# Patient Record
Sex: Female | Born: 1965 | Race: Black or African American | Hispanic: No | Marital: Married | Smoking: Never smoker
Health system: Southern US, Community
[De-identification: ages and names within clinical notes are randomized; demographics above are authoritative.]

## PROBLEM LIST (undated history)

## (undated) DIAGNOSIS — R42 Dizziness and giddiness: Secondary | ICD-10-CM

## (undated) DIAGNOSIS — E039 Hypothyroidism, unspecified: Secondary | ICD-10-CM

## (undated) DIAGNOSIS — H539 Unspecified visual disturbance: Secondary | ICD-10-CM

## (undated) DIAGNOSIS — I10 Essential (primary) hypertension: Secondary | ICD-10-CM

## (undated) DIAGNOSIS — D649 Anemia, unspecified: Secondary | ICD-10-CM

## (undated) DIAGNOSIS — G35 Multiple sclerosis: Secondary | ICD-10-CM

## (undated) DIAGNOSIS — G709 Myoneural disorder, unspecified: Secondary | ICD-10-CM

## (undated) DIAGNOSIS — R7303 Prediabetes: Secondary | ICD-10-CM

## (undated) DIAGNOSIS — J45909 Unspecified asthma, uncomplicated: Secondary | ICD-10-CM

## (undated) DIAGNOSIS — I2699 Other pulmonary embolism without acute cor pulmonale: Secondary | ICD-10-CM

## (undated) HISTORY — DX: Dizziness and giddiness: R42

## (undated) HISTORY — DX: Myoneural disorder, unspecified: G70.9

## (undated) HISTORY — PX: EYE SURGERY: SHX253

## (undated) HISTORY — DX: Multiple sclerosis: G35

## (undated) HISTORY — DX: Unspecified visual disturbance: H53.9

## (undated) HISTORY — DX: Anemia, unspecified: D64.9

## (undated) HISTORY — DX: Essential (primary) hypertension: I10

---

## 1998-08-17 ENCOUNTER — Other Ambulatory Visit: Admission: RE | Admit: 1998-08-17 | Discharge: 1998-08-17 | Payer: Self-pay | Admitting: Gynecology

## 1999-06-13 ENCOUNTER — Ambulatory Visit (HOSPITAL_COMMUNITY): Admission: RE | Admit: 1999-06-13 | Discharge: 1999-06-13 | Payer: Self-pay | Admitting: Family Medicine

## 1999-06-13 ENCOUNTER — Encounter: Payer: Self-pay | Admitting: Family Medicine

## 1999-10-12 ENCOUNTER — Other Ambulatory Visit: Admission: RE | Admit: 1999-10-12 | Discharge: 1999-10-12 | Payer: Self-pay | Admitting: *Deleted

## 2000-11-17 ENCOUNTER — Other Ambulatory Visit: Admission: RE | Admit: 2000-11-17 | Discharge: 2000-11-17 | Payer: Self-pay | Admitting: Obstetrics and Gynecology

## 2000-11-26 ENCOUNTER — Encounter: Payer: Self-pay | Admitting: Obstetrics and Gynecology

## 2000-11-26 ENCOUNTER — Ambulatory Visit (HOSPITAL_COMMUNITY): Admission: RE | Admit: 2000-11-26 | Discharge: 2000-11-26 | Payer: Self-pay | Admitting: Obstetrics and Gynecology

## 2001-02-06 ENCOUNTER — Other Ambulatory Visit: Admission: RE | Admit: 2001-02-06 | Discharge: 2001-02-06 | Payer: Self-pay | Admitting: Obstetrics and Gynecology

## 2001-05-06 ENCOUNTER — Other Ambulatory Visit: Admission: RE | Admit: 2001-05-06 | Discharge: 2001-05-06 | Payer: Self-pay | Admitting: Obstetrics and Gynecology

## 2001-07-28 ENCOUNTER — Other Ambulatory Visit: Admission: RE | Admit: 2001-07-28 | Discharge: 2001-07-28 | Payer: Self-pay | Admitting: Obstetrics and Gynecology

## 2001-10-13 ENCOUNTER — Other Ambulatory Visit: Admission: RE | Admit: 2001-10-13 | Discharge: 2001-10-13 | Payer: Self-pay | Admitting: Obstetrics and Gynecology

## 2002-01-03 ENCOUNTER — Emergency Department (HOSPITAL_COMMUNITY): Admission: EM | Admit: 2002-01-03 | Discharge: 2002-01-03 | Payer: Self-pay

## 2002-01-08 ENCOUNTER — Other Ambulatory Visit: Admission: RE | Admit: 2002-01-08 | Discharge: 2002-01-08 | Payer: Self-pay | Admitting: Obstetrics and Gynecology

## 2002-04-03 ENCOUNTER — Other Ambulatory Visit: Admission: RE | Admit: 2002-04-03 | Discharge: 2002-04-03 | Payer: Self-pay | Admitting: Obstetrics and Gynecology

## 2002-09-11 ENCOUNTER — Other Ambulatory Visit: Admission: RE | Admit: 2002-09-11 | Discharge: 2002-09-11 | Payer: Self-pay | Admitting: Obstetrics and Gynecology

## 2003-02-05 ENCOUNTER — Other Ambulatory Visit: Admission: RE | Admit: 2003-02-05 | Discharge: 2003-02-05 | Payer: Self-pay | Admitting: Obstetrics and Gynecology

## 2004-01-28 ENCOUNTER — Other Ambulatory Visit: Admission: RE | Admit: 2004-01-28 | Discharge: 2004-01-28 | Payer: Self-pay | Admitting: Obstetrics and Gynecology

## 2004-11-17 ENCOUNTER — Other Ambulatory Visit: Admission: RE | Admit: 2004-11-17 | Discharge: 2004-11-17 | Payer: Self-pay | Admitting: Obstetrics and Gynecology

## 2004-11-20 ENCOUNTER — Ambulatory Visit (HOSPITAL_COMMUNITY): Admission: RE | Admit: 2004-11-20 | Discharge: 2004-11-20 | Payer: Self-pay | Admitting: Obstetrics and Gynecology

## 2005-11-21 ENCOUNTER — Other Ambulatory Visit: Admission: RE | Admit: 2005-11-21 | Discharge: 2005-11-21 | Payer: Self-pay | Admitting: Obstetrics and Gynecology

## 2005-11-22 ENCOUNTER — Ambulatory Visit (HOSPITAL_COMMUNITY): Admission: RE | Admit: 2005-11-22 | Discharge: 2005-11-22 | Payer: Self-pay | Admitting: Obstetrics and Gynecology

## 2006-11-25 ENCOUNTER — Ambulatory Visit (HOSPITAL_COMMUNITY): Admission: RE | Admit: 2006-11-25 | Discharge: 2006-11-25 | Payer: Self-pay | Admitting: Obstetrics and Gynecology

## 2007-11-27 ENCOUNTER — Ambulatory Visit (HOSPITAL_COMMUNITY): Admission: RE | Admit: 2007-11-27 | Discharge: 2007-11-27 | Payer: Self-pay | Admitting: Obstetrics and Gynecology

## 2008-10-15 ENCOUNTER — Other Ambulatory Visit: Admission: RE | Admit: 2008-10-15 | Discharge: 2008-10-15 | Payer: Self-pay | Admitting: Family Medicine

## 2009-11-10 ENCOUNTER — Encounter: Admission: RE | Admit: 2009-11-10 | Discharge: 2009-11-10 | Payer: Self-pay | Admitting: Neurology

## 2010-01-12 ENCOUNTER — Other Ambulatory Visit: Admission: RE | Admit: 2010-01-12 | Discharge: 2010-01-12 | Payer: Self-pay | Admitting: Family Medicine

## 2010-07-26 ENCOUNTER — Ambulatory Visit (HOSPITAL_COMMUNITY): Admission: RE | Admit: 2010-07-26 | Discharge: 2010-07-26 | Payer: Self-pay | Admitting: Family Medicine

## 2011-02-23 NOTE — H&P (Signed)
[facility]  Patient:    Pamela Potter, Pamela Potter                         MRN: [id number] Adm. Date:  12/03/00 Attending:  Erie Noe P. Pennie Rushing, M.D. Dictator:   Henreitta Leber, P.A.                         History and Physical  HISTORY OF PRESENT ILLNESS:   Pamela Potter is a 45 year old married African-American female, para 1-0-1-1, with a history of multiple sclerosis -- which is currently in remission.  She presents for laparoscopic tubal cautery.  PAST MEDICAL HISTORY:  OB HISTORY:                   Para 1-0-1-1.  The patient experienced oligohydramnios during her pregnancy.  GYN HISTORY:                  Menarche 46 years old.  Periods occur every 28 days, with slow ranging from 2-3 days.  She is using Ortho-Novum 7-7-7 as a method of contraception, along with condoms -- though she admits to frequently forgetting her oral contraceptives.  The patient has never had a history of an abnormal Pap smear, with her last normal Pap being January 2001.  The patient does have a remote history of chlamydia; however, she denies any problems with her breasts or dyspareunia.  MEDICAL HISTORY:              Positive for multiple sclerosis, which seemingly flares with any major illness.  SURGICAL HISTORY:             Wisdom tooth extraction.  FAMILY HISTORY:               Positive for hypertension and blood clots.  SOCIAL HISTORY:               The patient is married and is currently unemployed, secondary to her multiple sclerosis.  CURRENT MEDICATIONS:          Ortho-Novum 7-7-7.  ALLERGIES:                    SULFA (causes hives).  HABITS:                       The patient does not smoke, use alcohol or recreational drugs.  She does exercise on occasion.  Exams her breasts occasionally.  Consumes a regular diet.  REVIEW OF SYSTEMS:            Negative, except as previously mentioned in her history of present illness.  PHYSICAL EXAMINATION:  VITAL SIGNS:                   Blood pressure 114/90, weight 153,                               height 5 feet 3.75 inches.  HEENT:                        Within normal limits.  NECK:                         Thyroid is not enlarged.  HEART:  Regular rate and rhythm.  LUNGS:                        Clear.  BREASTS:                      Without masses, discharge, skin changes or                               nipple retractions.  BACK:                         Without CVA tenderness.  ABDOMEN:                      Nontender, without masses or organomegaly.  EXTREMITIES:                  No clubbing, cyanosis, or edema.  PELVIC:                       EGBUS is within normal limits.  Vagina is rugose.  Cervix is nontender, without lesions.  Uterus normal size, shape and consistency; and it is nontender.  Adnexa without masses or tenderness. Rectovaginal without masses; however, the patient does have palpable hard stool.  IMPRESSION:                   1. Desire for sterilization.                               2. Multiple sclerosis.  DISPOSITION:                  Review was conducted with patient regarding methods of contraception, their mechanism of action, effectiveness, risks and benefits.  The patient has decided to pursue sterilization by laparoscopic tubal cautery, and is willing to accept the risks associated with this procedure to include, but not limited to:  reaction to anesthesia, infection, bleeding and damage to adjacent organs.  The patient is scheduled for laparoscopic tubal cautery at [facility] on December 03, 2000 at 10:15 a.m. DD:  11/26/00 TD:  11/26/00 Job: [id number]

## 2011-07-26 ENCOUNTER — Other Ambulatory Visit (HOSPITAL_COMMUNITY)
Admission: RE | Admit: 2011-07-26 | Discharge: 2011-07-26 | Disposition: A | Discharge status: Home / Self Care | Payer: 59 | Source: Ambulatory Visit | Attending: Family Medicine | Admitting: Family Medicine

## 2011-07-26 ENCOUNTER — Other Ambulatory Visit: Payer: Self-pay | Admitting: Physician Assistant

## 2011-07-26 DIAGNOSIS — Z01419 Encounter for gynecological examination (general) (routine) without abnormal findings: Secondary | ICD-10-CM | POA: Insufficient documentation

## 2011-09-04 ENCOUNTER — Other Ambulatory Visit: Payer: Self-pay | Admitting: Psychiatry

## 2011-09-04 DIAGNOSIS — G35 Multiple sclerosis: Secondary | ICD-10-CM

## 2011-09-12 ENCOUNTER — Ambulatory Visit
Admission: RE | Admit: 2011-09-12 | Discharge: 2011-09-12 | Disposition: A | Discharge status: Home / Self Care | Payer: 59 | Source: Ambulatory Visit | Attending: Psychiatry | Admitting: Psychiatry

## 2011-09-12 DIAGNOSIS — G35 Multiple sclerosis: Secondary | ICD-10-CM

## 2011-09-12 MED ORDER — GADOBENATE DIMEGLUMINE 529 MG/ML IV SOLN
14.0000 mL | Freq: Once | INTRAVENOUS | Status: AC | PRN
  Administered 2011-09-12: 14 mL via INTRAVENOUS

## 2011-11-21 DIAGNOSIS — T85398A Other mechanical complication of other ocular prosthetic devices, implants and grafts, initial encounter: Secondary | ICD-10-CM | POA: Insufficient documentation

## 2011-11-21 DIAGNOSIS — H18609 Keratoconus, unspecified, unspecified eye: Secondary | ICD-10-CM | POA: Insufficient documentation

## 2012-02-22 ENCOUNTER — Other Ambulatory Visit: Payer: Self-pay | Admitting: Psychiatry

## 2012-02-22 DIAGNOSIS — G35 Multiple sclerosis: Secondary | ICD-10-CM

## 2012-02-22 DIAGNOSIS — G35D Multiple sclerosis, unspecified: Secondary | ICD-10-CM

## 2012-03-07 ENCOUNTER — Ambulatory Visit
Admission: RE | Admit: 2012-03-07 | Discharge: 2012-03-07 | Disposition: A | Discharge status: Home / Self Care | Payer: 59 | Source: Ambulatory Visit | Attending: Psychiatry | Admitting: Psychiatry

## 2012-03-07 DIAGNOSIS — G35 Multiple sclerosis: Secondary | ICD-10-CM

## 2012-08-04 ENCOUNTER — Other Ambulatory Visit (HOSPITAL_COMMUNITY)
Admission: RE | Admit: 2012-08-04 | Discharge: 2012-08-04 | Disposition: A | Discharge status: Home / Self Care | Payer: 59 | Source: Ambulatory Visit | Attending: Family Medicine | Admitting: Family Medicine

## 2012-08-04 ENCOUNTER — Other Ambulatory Visit: Payer: Self-pay | Admitting: Physician Assistant

## 2012-08-04 DIAGNOSIS — Z0000 Encounter for general adult medical examination without abnormal findings: Secondary | ICD-10-CM | POA: Insufficient documentation

## 2012-08-15 ENCOUNTER — Other Ambulatory Visit: Payer: Self-pay | Admitting: Psychiatry

## 2012-08-15 DIAGNOSIS — G35 Multiple sclerosis: Secondary | ICD-10-CM

## 2012-08-18 ENCOUNTER — Ambulatory Visit
Admission: RE | Admit: 2012-08-18 | Discharge: 2012-08-18 | Disposition: A | Discharge status: Home / Self Care | Payer: 59 | Source: Ambulatory Visit | Attending: Psychiatry | Admitting: Psychiatry

## 2012-08-18 DIAGNOSIS — G35 Multiple sclerosis: Secondary | ICD-10-CM

## 2012-08-18 MED ORDER — GADOBENATE DIMEGLUMINE 529 MG/ML IV SOLN
13.0000 mL | Freq: Once | INTRAVENOUS | Status: AC | PRN
  Administered 2012-08-18: 13 mL via INTRAVENOUS

## 2012-08-19 ENCOUNTER — Other Ambulatory Visit (HOSPITAL_COMMUNITY): Payer: Self-pay | Admitting: Physician Assistant

## 2012-08-19 DIAGNOSIS — Z1231 Encounter for screening mammogram for malignant neoplasm of breast: Secondary | ICD-10-CM

## 2012-08-20 ENCOUNTER — Other Ambulatory Visit: Payer: 59

## 2012-09-15 ENCOUNTER — Ambulatory Visit (HOSPITAL_COMMUNITY)
Admission: RE | Admit: 2012-09-15 | Discharge: 2012-09-15 | Disposition: A | Discharge status: Home / Self Care | Payer: 59 | Source: Ambulatory Visit | Attending: Physician Assistant | Admitting: Physician Assistant

## 2012-09-15 DIAGNOSIS — Z1231 Encounter for screening mammogram for malignant neoplasm of breast: Secondary | ICD-10-CM

## 2013-05-12 ENCOUNTER — Other Ambulatory Visit: Payer: 59

## 2013-05-12 ENCOUNTER — Other Ambulatory Visit: Payer: Self-pay | Admitting: Endocrinology

## 2013-05-12 ENCOUNTER — Other Ambulatory Visit: Payer: Self-pay | Admitting: *Deleted

## 2013-05-12 DIAGNOSIS — E039 Hypothyroidism, unspecified: Secondary | ICD-10-CM

## 2013-08-26 ENCOUNTER — Other Ambulatory Visit: Payer: Self-pay | Admitting: Family Medicine

## 2013-08-26 ENCOUNTER — Other Ambulatory Visit (HOSPITAL_COMMUNITY)
Admission: RE | Admit: 2013-08-26 | Discharge: 2013-08-26 | Disposition: A | Discharge status: Home / Self Care | Payer: 59 | Source: Ambulatory Visit | Attending: Family Medicine | Admitting: Family Medicine

## 2013-08-26 DIAGNOSIS — Z0000 Encounter for general adult medical examination without abnormal findings: Secondary | ICD-10-CM | POA: Insufficient documentation

## 2013-08-26 DIAGNOSIS — Z1151 Encounter for screening for human papillomavirus (HPV): Secondary | ICD-10-CM | POA: Insufficient documentation

## 2013-08-26 DIAGNOSIS — R87810 Cervical high risk human papillomavirus (HPV) DNA test positive: Secondary | ICD-10-CM | POA: Insufficient documentation

## 2013-08-26 DIAGNOSIS — Z124 Encounter for screening for malignant neoplasm of cervix: Secondary | ICD-10-CM | POA: Insufficient documentation

## 2013-09-28 ENCOUNTER — Other Ambulatory Visit (HOSPITAL_COMMUNITY): Payer: Self-pay | Admitting: Family Medicine

## 2013-09-28 DIAGNOSIS — Z1231 Encounter for screening mammogram for malignant neoplasm of breast: Secondary | ICD-10-CM

## 2013-10-13 ENCOUNTER — Other Ambulatory Visit: Payer: Self-pay | Admitting: Obstetrics and Gynecology

## 2013-10-15 ENCOUNTER — Ambulatory Visit (HOSPITAL_COMMUNITY)
Admission: RE | Admit: 2013-10-15 | Discharge: 2013-10-15 | Disposition: A | Discharge status: Home / Self Care | Payer: 59 | Source: Ambulatory Visit | Attending: Family Medicine | Admitting: Family Medicine

## 2013-10-15 DIAGNOSIS — Z1231 Encounter for screening mammogram for malignant neoplasm of breast: Secondary | ICD-10-CM | POA: Insufficient documentation

## 2013-11-23 ENCOUNTER — Telehealth: Payer: Self-pay | Admitting: *Deleted

## 2013-11-23 NOTE — Telephone Encounter (Signed)
rx denied, patient needs ov

## 2014-05-14 ENCOUNTER — Other Ambulatory Visit: Payer: Self-pay | Admitting: Physician Assistant

## 2014-05-14 DIAGNOSIS — G35 Multiple sclerosis: Secondary | ICD-10-CM

## 2014-05-17 ENCOUNTER — Ambulatory Visit
Admission: RE | Admit: 2014-05-17 | Discharge: 2014-05-17 | Disposition: A | Discharge status: Home / Self Care | Payer: 59 | Source: Ambulatory Visit | Attending: Physician Assistant | Admitting: Physician Assistant

## 2014-05-17 DIAGNOSIS — G35 Multiple sclerosis: Secondary | ICD-10-CM

## 2014-05-17 MED ORDER — GADOBENATE DIMEGLUMINE 529 MG/ML IV SOLN
14.0000 mL | Freq: Once | INTRAVENOUS | Status: AC | PRN
  Administered 2014-05-17: 14 mL via INTRAVENOUS

## 2014-05-23 ENCOUNTER — Ambulatory Visit
Admission: RE | Admit: 2014-05-23 | Discharge: 2014-05-23 | Disposition: A | Discharge status: Home / Self Care | Payer: 59 | Source: Ambulatory Visit | Attending: Physician Assistant | Admitting: Physician Assistant

## 2014-05-23 DIAGNOSIS — G35 Multiple sclerosis: Secondary | ICD-10-CM

## 2014-05-23 MED ORDER — GADOBENATE DIMEGLUMINE 529 MG/ML IV SOLN
14.0000 mL | Freq: Once | INTRAVENOUS | Status: AC | PRN
  Administered 2014-05-23: 14 mL via INTRAVENOUS

## 2014-12-15 ENCOUNTER — Other Ambulatory Visit: Payer: Self-pay | Admitting: Obstetrics and Gynecology

## 2015-04-07 ENCOUNTER — Other Ambulatory Visit (HOSPITAL_COMMUNITY): Payer: Self-pay | Admitting: Family Medicine

## 2015-04-07 DIAGNOSIS — Z1231 Encounter for screening mammogram for malignant neoplasm of breast: Secondary | ICD-10-CM

## 2015-04-19 ENCOUNTER — Ambulatory Visit (HOSPITAL_COMMUNITY)
Admission: RE | Admit: 2015-04-19 | Discharge: 2015-04-19 | Disposition: A | Discharge status: Home / Self Care | Payer: 59 | Source: Ambulatory Visit | Attending: Family Medicine | Admitting: Family Medicine

## 2015-04-19 ENCOUNTER — Other Ambulatory Visit (HOSPITAL_COMMUNITY): Payer: Self-pay | Admitting: Family Medicine

## 2015-04-19 DIAGNOSIS — Z1231 Encounter for screening mammogram for malignant neoplasm of breast: Secondary | ICD-10-CM

## 2015-10-09 HISTORY — PX: EYE SURGERY: SHX253

## 2015-12-27 ENCOUNTER — Other Ambulatory Visit: Payer: Self-pay

## 2016-02-01 ENCOUNTER — Other Ambulatory Visit: Payer: Self-pay | Admitting: Physician Assistant

## 2016-02-01 DIAGNOSIS — G35 Multiple sclerosis: Secondary | ICD-10-CM

## 2016-02-10 ENCOUNTER — Ambulatory Visit
Admission: RE | Admit: 2016-02-10 | Discharge: 2016-02-10 | Disposition: A | Discharge status: Home / Self Care | Payer: 59 | Source: Ambulatory Visit | Attending: Physician Assistant | Admitting: Physician Assistant

## 2016-02-10 DIAGNOSIS — G35 Multiple sclerosis: Secondary | ICD-10-CM

## 2016-02-10 MED ORDER — GADOBENATE DIMEGLUMINE 529 MG/ML IV SOLN
14.0000 mL | Freq: Once | INTRAVENOUS | Status: AC | PRN
  Administered 2016-02-10: 14 mL via INTRAVENOUS

## 2016-02-11 ENCOUNTER — Ambulatory Visit (INDEPENDENT_AMBULATORY_CARE_PROVIDER_SITE_OTHER): Payer: 59 | Admitting: Internal Medicine

## 2016-02-11 ENCOUNTER — Ambulatory Visit (INDEPENDENT_AMBULATORY_CARE_PROVIDER_SITE_OTHER): Payer: 59

## 2016-02-11 VITALS — BP 138/82 | HR 84 | Temp 98.3°F | Resp 16 | Ht 63.0 in | Wt 159.0 lb

## 2016-02-11 DIAGNOSIS — M25571 Pain in right ankle and joints of right foot: Secondary | ICD-10-CM | POA: Diagnosis not present

## 2016-02-11 DIAGNOSIS — M24271 Disorder of ligament, right ankle: Secondary | ICD-10-CM | POA: Diagnosis not present

## 2016-02-11 DIAGNOSIS — G35 Multiple sclerosis: Secondary | ICD-10-CM

## 2016-02-11 NOTE — Patient Instructions (Signed)
     IF you received an x-ray today, you will receive an invoice from [facility]. Please contact [facility] at [phone] with questions or concerns regarding your invoice.   IF you received labwork today, you will receive an invoice from [facility]. Please contact [facility] at [phone] with questions or concerns regarding your invoice.   Our billing staff will not be able to assist you with questions regarding bills from these companies.  You will be contacted with the lab results as soon as they are available. The fastest way to get your results is to activate your My Chart account. Instructions are located on the last page of this paperwork. If you have not heard from us regarding the results in 2 weeks, please contact this office.      

## 2016-02-11 NOTE — Progress Notes (Signed)
Subjective:  By signing my name below, I, Raven Small, attest that this documentation has been prepared under the direction and in the presence of Tami Lin, MD.  Electronically Signed: Thea Alken, ED Scribe. 02/11/2016. 11:42 AM.   Patient ID: Pamela Potter, female    DOB: 04-May-1966, 50 y.o.   MRN: [id number]  HPI Chief Complaint  Patient presents with  . Ankle Pain    right, x 2 weeks    HPI Comments: Pamela Potter is a 50 y.o. female who presents to the [facility] complaining of right ankle pain the began 2 weeks ago. Pt reports hx of right ankle fx 10 months ago. States at that time she twisted her right ankle while exiting a bus. Pt states right ankle never got better and has had worsening pain over the past week or 2. She reports pain with certain movements of ankle. Pt has hx of MS with slight neuropathy, followed by Dr. Starleen Blue.   There are no active problems to display for this patient.  Past Medical History  Diagnosis Date  . Anemia   . Hypertension   . Neuromuscular disorder [facility])    Past Surgical History  Procedure Laterality Date  . Eye surgery     Allergies  Allergen Reactions  . Ace Inhibitors Swelling    lips  . Sulfa Antibiotics Hives and Itching  . Dilantin [Phenytoin Sodium Extended]   . Lisinopril   . Norvasc [Amlodipine Besylate]    Prior to Admission medications   Medication Sig Start Date End Date Taking? Authorizing Provider  carvedilol (COREG) 3.125 MG tablet Take 3.125 mg by mouth 2 (two) times daily with a meal.   Yes Historical Provider, MD  hydrochlorothiazide (MICROZIDE) 12.5 MG capsule Take 12.5 mg by mouth daily.   Yes Historical Provider, MD  levothyroxine (SYNTHROID, LEVOTHROID) 50 MCG tablet Take 50 mcg by mouth daily before breakfast.   Yes Historical Provider, MD  prednisoLONE acetate (PRED MILD) 0.12 % ophthalmic suspension 1 drop 4 (four) times daily.   Yes Historical Provider, MD    triamterene-hydrochlorothiazide (DYAZIDE) 37.5-25 MG capsule Take 1 capsule by mouth daily.   Yes Historical Provider, MD   Social History   Social History  . Marital Status: Married    Spouse Name: N/A  . Number of Children: N/A  . Years of Education: N/A   Occupational History  . Not on file.   Social History Main Topics  . Smoking status: Never Smoker   . Smokeless tobacco: Not on file  . Alcohol Use: Not on file  . Drug Use: Not on file  . Sexual Activity: Not on file   Other Topics Concern  . Not on file   Social History Narrative  . No narrative on file     Review of Systems  Musculoskeletal: Positive for myalgias and arthralgias.  Skin: Negative for color change, rash and wound.  Neurological: Positive for numbness. Negative for weakness.       Objective:   Physical Exam  Constitutional: She is oriented to person, place, and time. She appears well-developed and well-nourished. No distress.  HENT:  Head: Normocephalic and atraumatic.  Eyes: Conjunctivae and EOM are normal.  Neck: Neck supple.  Cardiovascular: Normal rate.   Pulmonary/Chest: Effort normal.  Musculoskeletal: Normal range of motion.  Right ankle slightly swollen laterally. TTP to ATF and CF. Laxity inversion.   Neurological: She is alert and oriented to person, place, and time.  Skin:  Skin is warm and dry.  Psychiatric: She has a normal mood and affect. Her behavior is normal.  Nursing note and vitals reviewed.  Filed Vitals:   02/11/16 1005  BP: 138/82  Pulse: 84  Temp: 98.3 F (36.8 C)  Resp: 16  Height: 5\' 3"  (1.6 m)  Weight: 159 lb (72.122 kg)  SpO2: 100%   Dg Ankle Complete Right  02/11/2016  CLINICAL DATA:  Chronic right ankle pain. Fall 1 year ago with reported fracture. EXAM: RIGHT ANKLE - COMPLETE 3+ VIEW COMPARISON:  None. FINDINGS: Subchondral cystic change at the medial talar dome, best visualized on oblique and lateral radiographs. There is no associated degenerative  joint narrowing or spurring. No acute fracture or subluxation. No evidence of joint effusion. IMPRESSION: 1. No acute finding. 2. Talar subchondral cystic change, likely from prior osteochondral injury. Electronically Signed   By: Monte Fantasia M.D.   On: 02/11/2016 12:44   Assessment & Plan:   1. Ankle ligament laxity, right   2. Pain in joint, ankle and foot, right--chronic   3. MS (multiple sclerosis) ([place])   ? Osteochondral defect from original injury  Plan swedo for now and refer to Dr Doran Durand      I have completed the patient encounter in its entirety as documented by the scribe, with editing by me where necessary. Devanie Galanti P. Laney Pastor, M.D.

## 2016-03-08 ENCOUNTER — Other Ambulatory Visit: Payer: Self-pay | Admitting: Obstetrics and Gynecology

## 2016-10-11 ENCOUNTER — Encounter: Payer: Self-pay | Admitting: Neurology

## 2016-10-11 ENCOUNTER — Ambulatory Visit (INDEPENDENT_AMBULATORY_CARE_PROVIDER_SITE_OTHER): Payer: 59 | Admitting: Neurology

## 2016-10-11 DIAGNOSIS — G35 Multiple sclerosis: Secondary | ICD-10-CM

## 2016-10-11 DIAGNOSIS — R200 Anesthesia of skin: Secondary | ICD-10-CM | POA: Diagnosis not present

## 2016-10-11 DIAGNOSIS — N3941 Urge incontinence: Secondary | ICD-10-CM | POA: Diagnosis not present

## 2016-10-11 DIAGNOSIS — R269 Unspecified abnormalities of gait and mobility: Secondary | ICD-10-CM | POA: Insufficient documentation

## 2016-10-11 NOTE — Progress Notes (Signed)
[facility]  PATIENT: Pamela Potter DOB: 14-Aug-1966  REFERRING DOCTOR OR PCP:  Dr. Starleen Blue    Dr. Maude Leriche is PCP [name] SOURCE: patient, records from Dr. Starleen Blue. Laboratory results, MRI results, multiple MRI images on PACS  _________________________________   HISTORICAL  CHIEF COMPLAINT:  Chief Complaint  Patient presents with  . Multiple Sclerosis    Pamela Potter is here to transfer care of MS to Dr. Felecia Shelling.  Sts. she was dx. in 1991.  Sts. presenting sx. was gait disturbance, weakness, vertigo. Dx. confirmed with MRI brain and LP.  Sts. she was dx.  by Dr. Adolphus Birchwood in [place].  She initially started Avonex.  Around 10 yrs. later, MRI brain showed new lesions.  She was referred to Dr. Dellis Filbert and was switched from Avonex to Tysabri.  She was on Tysabri for 2-3 yrs., then, due to positive JCV ab she switched to Ayr.  Sts. about 2 yrs. later   . Tngling    she had new MS changes on MRI, so she switched back to Tysabri. Sts. JCV ab status continued to be positive and value was increasing, so in the summer of 2017, she switched to Pamela Potter.  She had part A of the first infusion in June, and part B on 04-25-16. Next infusion is due this month.  Sts. the sx. that bother her the most are numbness/tingliung in her feet, and difficulty with cognition/memory/fim    HISTORY OF PRESENT ILLNESS:  I had the pleasure seeing you patient, Pamela Potter, at [facility] for neurologic consultation regarding her multiple sclerosis.  She was diagnosed with MS in 1991 after presenting with gait disturbance, weakness and vertigo. An MRI of the brain showed plaques and a lumbar puncture were reportedly consistent with MS. She saw Dr. Adolphus Birchwood a couple years later and was started her on Avonex.   She stayed on Avonex for about 10 years but then had new lesions on her MRI. She was referred to Dr. Jacqulynn Cadet. He placed her on Tysabri and she stayed  on for a few years. 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 Unfortunately, on Gilenya, she had some MRI progression and Tysabri. Of note, her JCV antibody titers were initially less than 0.9. However, over the last couple years her titers have increased and her last JCV antibody was 1.03. Around June, she was switched to ocrelizumab. She had her first dose the end of June and her second dose in mid July. She has not noted any exacerbations while on ocrelizumab.  Gait/strength/sensation: Since her first exacerbation in 1991, she has had some difficulty with her balance and has had numbness in her feet she notes that her balance has worsened a little bit since then but has been fairly stable over the past few years. She stumbles but she has not had any recent falls. She notes that her legs sometimes feel a little bit weak. This used to be worse on the right but now is worse on the left.  Bladder/bowel: She has urinary urgency and frequency. She also has nocturia several times a night. She has had incontinence and wears pads. She denies constipation.  Vision: She denies any MS related visual problems and has not had optic neuritis or diplopia. 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  Fatigue/sleep: She does not report any major problems with fatigue. She does have some insomnia at  times but is able to sleep in late so this has not been troublesome. She wakes up several times a night to use the bathroom.   Mood/cognition: She denies any depression or anxiety. She has noted mild cognitive issues including problems coming up with the right words and decreased focus and attention  I personally reviewed the MRI images from 02/10/2016 I compare them to the MRI from 05/17/2014. She has classic T2/FLAIR hyperintense foci in the periventricular, juxtacortical and deep white matter of both hemispheres and also in the pons and  right middle cerebellar peduncle in a pattern and configuration consistent with chronic demyelinating plaque associated with MS.   There were no acute findings and no change when compared to the previous MRI. The MRI of the cervical and thoracic spine showed foci in the upper cervical spine and in the mid to lower thoracic spine. None of these were acute.    I reviewed her labs. Her JCV antibody on 12/25/2015 was 1.03 (middle positive). She had normal CD4 and CD8 cells.   Hepatitis B labs and Quantiferon TB test were negative or nonreactive.   REVIEW OF SYSTEMS: Constitutional: No fevers, chills, sweats, or change in appetite.  Mild fatigue Eyes: as abvove Ear, nose and throat: No hearing loss, ear pain, nasal congestion, sore throat Cardiovascular: No chest pain, palpitations Respiratory: No shortness of breath at rest or with exertion.   No wheezes GastrointestinaI: No nausea, vomiting, diarrhea, abdominal pain, fecal incontinence Genitourinary: Notes frequency and nocturia with some incontinence Musculoskeletal: No neck pain, back pain Integumentary: No rash, pruritus, skin lesions Neurological: as above Psychiatric: No depression at this time.  No anxiety Endocrine: No palpitations, diaphoresis, change in appetite, change in weigh or increased thirst Hematologic/Lymphatic: No anemia, purpura, petechiae. Allergic/Immunologic: No itchy/runny eyes, nasal congestion, recent allergic reactions, rashes  ALLERGIES: Allergies  Allergen Reactions  . Ace Inhibitors Swelling    lips  . Sulfa Antibiotics Hives and Itching  . Dilantin [Phenytoin Sodium Extended]   . Lisinopril   . Norvasc [Amlodipine Besylate]     HOME MEDICATIONS:  Current Outpatient Prescriptions:  .  carvedilol (COREG) 3.125 MG tablet, Take 3.125 mg by mouth 2 (two) times daily with a meal., Disp: , Rfl:  .  hydrochlorothiazide (MICROZIDE) 12.5 MG capsule, Take 12.5 mg by mouth daily., Disp: , Rfl:  .  levothyroxine  (SYNTHROID, LEVOTHROID) 50 MCG tablet, Take 50 mcg by mouth daily before breakfast., Disp: , Rfl:  .  NORTREL 7/7/7 0.5/0.75/1-35 MG-MCG tablet, TAKE 1 TABLET(S) EVERY DAY BY ORAL ROUTE., Disp: , Rfl: 0 .  ocrelizumab 600 mg in sodium chloride 0.9 % 500 mL, Inject 600 mg into the vein every 6 (six) months., Disp: , Rfl:  .  prednisoLONE acetate (PRED MILD) 0.12 % ophthalmic suspension, 1 drop 4 (four) times daily., Disp: , Rfl:  .  triamterene-hydrochlorothiazide (DYAZIDE) 37.5-25 MG capsule, Take 1 capsule by mouth daily., Disp: , Rfl:   PAST MEDICAL HISTORY: Past Medical History:  Diagnosis Date  . Anemia   . Hypertension   . Multiple sclerosis ([place])   . Neuromuscular disorder ([place])   . Vertigo   . Vision abnormalities     PAST SURGICAL HISTORY: Past Surgical History:  Procedure Laterality Date  . EYE SURGERY      FAMILY HISTORY: Family History  Problem Relation Age of Onset  . Hyperlipidemia Mother   . Hypertension Mother   . Stroke Father   . Diabetes Father     SOCIAL HISTORY:  Social History   Social History  . Marital status: Married    Spouse name: N/A  . Number of children: N/A  . Years of education: N/A   Occupational History  . Not on file.   Social History Main Topics  . Smoking status: Never Smoker  . Smokeless tobacco: Not on file  . Alcohol use Not on file  . Drug use: Unknown  . Sexual activity: Not on file   Other Topics Concern  . Not on file   Social History Narrative  . No narrative on file     PHYSICAL EXAM  Vitals:   10/11/16 1401  BP: (!) 141/99  Pulse: 88  Resp: (!) 6  Weight: 163 lb 8 oz (74.2 kg)  Height: 5\' 3"  (1.6 m)    Body mass index is 28.96 kg/m.   General: The patient is well-developed and well-nourished and in no acute distress  Eyes:  Funduscopic exam shows normal optic discs and retinal vessels.She has evidence of prior surgery involving the left cornea.  Neck: The neck is supple, no carotid bruits are  noted.  The neck is nontender.  Cardiovascular: The heart has a regular rate and rhythm with a normal S1 and S2. There were no murmurs, gallops or rubs. Lungs are clear to auscultation.  Skin: Extremities are without significant edema.  Musculoskeletal:  Back is nontender  Neurologic Exam  Mental status: The patient is alert and oriented x 3 at the time of the examination. The patient has apparent normal recent and remote memory, with an apparently normal attention span and concentration ability.   Speech is normal.  Cranial nerves: Extraocular movements are full. Pupils are equal, round, and reactive to light and accomodation.  Visual fields are full.  Facial symmetry is present. There is good facial sensation to soft touch bilaterally.Facial strength is normal.  Trapezius and sternocleidomastoid strength is normal. No dysarthria is noted.  The tongue is midline, and the patient has symmetric elevation of the soft palate. No obvious hearing deficits are noted.  Motor:  Muscle bulk is normal.   Tone is increased in legsl. Strength is  5 / 5 in all 4 extremities.   Sensory: Sensory testing is intact to pinprick, soft touch and vibration sensation in all 4 extremities.  Coordination: Cerebellar testing reveals good finger-nose-finger and mildly reduced heel-to-shin bilaterally.  Gait and station: Station is normal.   Gait is mildly wide and tandem gait is moderately wide. Romberg is negative.   Reflexes: Deep tendon reflexes are symmetric and normal in both arms. She has increased reflexes bilaterally in the legs, right more than the left. She has spread at her knees bilaterally. There are 2-3 beats of nonsustained clonus in the right leg.   Plantar responses are flexor.    DIAGNOSTIC DATA (LABS, IMAGING, TESTING) - I reviewed patient records, labs, notes, testing and imaging myself where available.      ASSESSMENT AND PLAN  Multiple sclerosis ([place])  Gait disturbance  Urge  incontinence of urine  Numbness   In summary, Pamela Potter is a 51 year old woman with a long history of multiple sclerosis. Her main issues are with reduced gait and bladder function.    She appears to have done well on her first dose of ocrelizumab and would like to stay on that medication. We will set her up for her next infusion. She is advised to stay active and exercises as tolerated. She is also advised to take OTC vitamin D.  She may proceed with anesthesia for a tooth extraction this month.   he faxed to her dentist.  She will return to see me in 6 months or sooner if there are new or worsening neurologic symptoms. Thank you for asking me to see Pamela Potter. Please let me know if I can be of further assistance with her or other patients in the future.  Richard A. Felecia Shelling, MD, PhD 0000000, 0000000 PM Certified in Neurology, Clinical Neurophysiology, Sleep Medicine, Pain Medicine and Neuroimaging  [facility] [address], [place], [place] [address] [phone]

## 2016-10-22 ENCOUNTER — Telehealth: Payer: Self-pay | Admitting: Neurology

## 2016-10-22 NOTE — Telephone Encounter (Signed)
I have spoken with Hillary at Dr. Dorian Heckle office and clarified there are no contraindications to rx'ing Decadron post-op/fim

## 2016-10-22 NOTE — Telephone Encounter (Signed)
Pamela Potter with Dr. Kerry Kass office is calling regarding a medical clearance form that was faxed to their office regarding the patient. She has questions about the top portion of the form. The patient is scheduled for oral surgery this Wednesday.

## 2016-11-21 DIAGNOSIS — T887XXA Unspecified adverse effect of drug or medicament, initial encounter: Secondary | ICD-10-CM | POA: Diagnosis not present

## 2016-11-27 DIAGNOSIS — G35 Multiple sclerosis: Secondary | ICD-10-CM | POA: Diagnosis not present

## 2016-11-29 DIAGNOSIS — Z947 Corneal transplant status: Secondary | ICD-10-CM | POA: Diagnosis not present

## 2016-11-29 DIAGNOSIS — H1131 Conjunctival hemorrhage, right eye: Secondary | ICD-10-CM | POA: Diagnosis not present

## 2017-01-01 ENCOUNTER — Ambulatory Visit (INDEPENDENT_AMBULATORY_CARE_PROVIDER_SITE_OTHER): Payer: 59 | Admitting: Physician Assistant

## 2017-01-01 VITALS — BP 134/91 | HR 100 | Temp 98.1°F | Resp 18 | Ht 63.0 in | Wt 167.8 lb

## 2017-01-01 DIAGNOSIS — G35 Multiple sclerosis: Secondary | ICD-10-CM

## 2017-01-01 DIAGNOSIS — M25512 Pain in left shoulder: Secondary | ICD-10-CM

## 2017-01-01 DIAGNOSIS — M62838 Other muscle spasm: Secondary | ICD-10-CM | POA: Diagnosis not present

## 2017-01-01 DIAGNOSIS — Z87828 Personal history of other (healed) physical injury and trauma: Secondary | ICD-10-CM

## 2017-01-01 MED ORDER — PREDNISONE 20 MG PO TABS: ORAL_TABLET | ORAL | 0 refills | Status: DC

## 2017-01-01 MED ORDER — MELOXICAM 15 MG PO TABS: 15.0000 mg | ORAL_TABLET | Freq: Every day | ORAL | 1 refills | Status: DC

## 2017-01-01 MED ORDER — CYCLOBENZAPRINE HCL 10 MG PO TABS: 10.0000 mg | ORAL_TABLET | Freq: Three times a day (TID) | ORAL | 0 refills | Status: DC | PRN

## 2017-01-01 NOTE — Patient Instructions (Addendum)
Use heat - apply heating pad 2-3 times a day for 30 minutes at a time. Do not apply this directly to your bare skin.  Stretch several times a day.  Massage your sore muscles.  Keep moving/stay active.  Drink 2-3 liters of water/day.  Follow-up with your neurologist if you are not improving after your course of prednisone   Thank you for coming in today. I hope you feel we met your needs.  Feel free to call [facility] if you have any questions or further requests.  Please consider signing up for MyChart if you do not already have it, as this is a great way to communicate with me.  Best,  Whitney McVey, PA-C  IF you received an x-ray today, you will receive an invoice from [facility]. Please contact [facility] at [phone] with questions or concerns regarding your invoice.   IF you received labwork today, you will receive an invoice from [place]. Please contact [facility] at [phone] with questions or concerns regarding your invoice.   Our billing staff will not be able to assist you with questions regarding bills from these companies.  You will be contacted with the lab results as soon as they are available. The fastest way to get your results is to activate your My Chart account. Instructions are located on the last page of this paperwork. If you have not heard from [place] regarding the results in 2 weeks, please contact this office.
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
## 2017-01-17 DIAGNOSIS — Z01419 Encounter for gynecological examination (general) (routine) without abnormal findings: Secondary | ICD-10-CM | POA: Diagnosis not present

## 2017-01-17 DIAGNOSIS — Z124 Encounter for screening for malignant neoplasm of cervix: Secondary | ICD-10-CM | POA: Diagnosis not present

## 2017-01-22 ENCOUNTER — Other Ambulatory Visit: Payer: Self-pay

## 2017-01-22 DIAGNOSIS — M25512 Pain in left shoulder: Secondary | ICD-10-CM

## 2017-01-22 NOTE — Telephone Encounter (Signed)
01/01/17 last refill

## 2017-01-22 NOTE — Telephone Encounter (Signed)
Her original Rx was #30 with 1 refill. She should have one month left. Does she know there is a refill?

## 2017-03-12 ENCOUNTER — Telehealth: Payer: Self-pay | Admitting: *Deleted

## 2017-03-12 NOTE — Telephone Encounter (Signed)
Disability paperwork completed and sent up front to med. rec. so that form fee can be collected/fim

## 2017-03-13 ENCOUNTER — Telehealth: Payer: Self-pay | Admitting: *Deleted

## 2017-03-13 NOTE — Telephone Encounter (Signed)
Pt form ready for p/u form @ the front desk.

## 2017-03-15 DIAGNOSIS — Z0289 Encounter for other administrative examinations: Secondary | ICD-10-CM

## 2017-03-28 DIAGNOSIS — Z0000 Encounter for general adult medical examination without abnormal findings: Secondary | ICD-10-CM | POA: Diagnosis not present

## 2017-03-28 DIAGNOSIS — R7303 Prediabetes: Secondary | ICD-10-CM | POA: Diagnosis not present

## 2017-03-28 DIAGNOSIS — I10 Essential (primary) hypertension: Secondary | ICD-10-CM | POA: Diagnosis not present

## 2017-03-28 DIAGNOSIS — E559 Vitamin D deficiency, unspecified: Secondary | ICD-10-CM | POA: Diagnosis not present

## 2017-04-18 DIAGNOSIS — H52213 Irregular astigmatism, bilateral: Secondary | ICD-10-CM | POA: Diagnosis not present

## 2017-04-18 DIAGNOSIS — Z947 Corneal transplant status: Secondary | ICD-10-CM | POA: Diagnosis not present

## 2017-04-18 DIAGNOSIS — H2513 Age-related nuclear cataract, bilateral: Secondary | ICD-10-CM | POA: Diagnosis not present

## 2017-04-29 DIAGNOSIS — R197 Diarrhea, unspecified: Secondary | ICD-10-CM | POA: Diagnosis not present

## 2017-05-05 ENCOUNTER — Other Ambulatory Visit: Payer: Self-pay | Admitting: Physician Assistant

## 2017-05-05 DIAGNOSIS — M25512 Pain in left shoulder: Secondary | ICD-10-CM

## 2017-05-27 DIAGNOSIS — R197 Diarrhea, unspecified: Secondary | ICD-10-CM | POA: Diagnosis not present

## 2017-05-27 DIAGNOSIS — D126 Benign neoplasm of colon, unspecified: Secondary | ICD-10-CM | POA: Diagnosis not present

## 2017-05-27 DIAGNOSIS — K5730 Diverticulosis of large intestine without perforation or abscess without bleeding: Secondary | ICD-10-CM | POA: Diagnosis not present

## 2017-05-28 DIAGNOSIS — H16101 Unspecified superficial keratitis, right eye: Secondary | ICD-10-CM | POA: Diagnosis not present

## 2017-05-28 DIAGNOSIS — H04121 Dry eye syndrome of right lacrimal gland: Secondary | ICD-10-CM | POA: Diagnosis not present

## 2017-05-30 DIAGNOSIS — H5711 Ocular pain, right eye: Secondary | ICD-10-CM | POA: Diagnosis not present

## 2017-05-30 DIAGNOSIS — S0501XA Injury of conjunctiva and corneal abrasion without foreign body, right eye, initial encounter: Secondary | ICD-10-CM | POA: Diagnosis not present

## 2017-05-30 DIAGNOSIS — Z947 Corneal transplant status: Secondary | ICD-10-CM | POA: Diagnosis not present

## 2017-06-03 DIAGNOSIS — G35 Multiple sclerosis: Secondary | ICD-10-CM | POA: Diagnosis not present

## 2017-08-25 DIAGNOSIS — J069 Acute upper respiratory infection, unspecified: Secondary | ICD-10-CM | POA: Diagnosis not present

## 2017-08-28 DIAGNOSIS — J209 Acute bronchitis, unspecified: Secondary | ICD-10-CM | POA: Diagnosis not present

## 2017-09-12 DIAGNOSIS — T86840 Corneal transplant rejection: Secondary | ICD-10-CM | POA: Diagnosis not present

## 2017-09-12 DIAGNOSIS — H40051 Ocular hypertension, right eye: Secondary | ICD-10-CM | POA: Diagnosis not present

## 2017-09-12 DIAGNOSIS — H1820 Unspecified corneal edema: Secondary | ICD-10-CM | POA: Diagnosis not present

## 2017-09-20 DIAGNOSIS — T86840 Corneal transplant rejection: Secondary | ICD-10-CM | POA: Diagnosis not present

## 2017-09-20 DIAGNOSIS — H40051 Ocular hypertension, right eye: Secondary | ICD-10-CM | POA: Diagnosis not present

## 2017-10-04 DIAGNOSIS — H04121 Dry eye syndrome of right lacrimal gland: Secondary | ICD-10-CM | POA: Diagnosis not present

## 2017-10-04 DIAGNOSIS — T86840 Corneal transplant rejection: Secondary | ICD-10-CM | POA: Diagnosis not present

## 2017-10-04 DIAGNOSIS — H40051 Ocular hypertension, right eye: Secondary | ICD-10-CM | POA: Diagnosis not present

## 2017-10-28 DIAGNOSIS — T86840 Corneal transplant rejection: Secondary | ICD-10-CM | POA: Diagnosis not present

## 2017-10-28 DIAGNOSIS — H40051 Ocular hypertension, right eye: Secondary | ICD-10-CM | POA: Diagnosis not present

## 2017-10-28 DIAGNOSIS — Z947 Corneal transplant status: Secondary | ICD-10-CM | POA: Diagnosis not present

## 2017-12-03 ENCOUNTER — Ambulatory Visit: Payer: 59 | Admitting: Neurology

## 2017-12-05 DIAGNOSIS — G35 Multiple sclerosis: Secondary | ICD-10-CM | POA: Diagnosis not present

## 2017-12-11 DIAGNOSIS — H40051 Ocular hypertension, right eye: Secondary | ICD-10-CM | POA: Diagnosis not present

## 2017-12-11 DIAGNOSIS — Z947 Corneal transplant status: Secondary | ICD-10-CM | POA: Diagnosis not present

## 2017-12-11 DIAGNOSIS — H04123 Dry eye syndrome of bilateral lacrimal glands: Secondary | ICD-10-CM | POA: Diagnosis not present

## 2018-01-01 ENCOUNTER — Ambulatory Visit: Payer: 59 | Admitting: Neurology

## 2018-01-01 ENCOUNTER — Other Ambulatory Visit: Payer: Self-pay

## 2018-01-01 ENCOUNTER — Encounter: Payer: Self-pay | Admitting: Neurology

## 2018-01-01 VITALS — BP 142/88 | HR 92 | Resp 18 | Ht 63.0 in | Wt 160.5 lb

## 2018-01-01 DIAGNOSIS — R200 Anesthesia of skin: Secondary | ICD-10-CM | POA: Diagnosis not present

## 2018-01-01 DIAGNOSIS — R269 Unspecified abnormalities of gait and mobility: Secondary | ICD-10-CM

## 2018-01-01 DIAGNOSIS — Z79899 Other long term (current) drug therapy: Secondary | ICD-10-CM

## 2018-01-01 DIAGNOSIS — G35 Multiple sclerosis: Secondary | ICD-10-CM | POA: Diagnosis not present

## 2018-01-01 DIAGNOSIS — N3941 Urge incontinence: Secondary | ICD-10-CM | POA: Diagnosis not present
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
## 2018-01-02 ENCOUNTER — Telehealth: Payer: Self-pay | Admitting: Neurology

## 2018-01-02 LAB — CBC WITH DIFFERENTIAL/PLATELET
BASOS ABS: 0 10*3/uL (ref 0.0–0.2)
BASOS: 0 %
EOS (ABSOLUTE): 0.2 10*3/uL (ref 0.0–0.4)
Eos: 5 %
Hematocrit: 36.6 % (ref 34.0–46.6)
Hemoglobin: 11.9 g/dL (ref 11.1–15.9)
IMMATURE GRANS (ABS): 0 10*3/uL (ref 0.0–0.1)
IMMATURE GRANULOCYTES: 0 %
Lymphocytes Absolute: 1.4 10*3/uL (ref 0.7–3.1)
Lymphs: 28 %
MCH: 25.6 pg — ABNORMAL LOW (ref 26.6–33.0)
MCHC: 32.5 g/dL (ref 31.5–35.7)
MCV: 79 fL (ref 79–97)
MONOS ABS: 0.3 10*3/uL (ref 0.1–0.9)
Monocytes: 6 %
NEUTROS PCT: 61 %
Neutrophils Absolute: 2.9 10*3/uL (ref 1.4–7.0)
PLATELETS: 404 10*3/uL — AB (ref 150–379)
RBC: 4.64 x10E6/uL (ref 3.77–5.28)
RDW: 17.4 % — AB (ref 12.3–15.4)
WBC: 4.8 10*3/uL (ref 3.4–10.8)

## 2018-01-02 LAB — IGG, IGA, IGM
IgG (Immunoglobin G), Serum: 878 mg/dL (ref 700–1600)
IgM (Immunoglobulin M), Srm: 89 mg/dL (ref 26–217)
Immunoglobulin A, (IgA) QN, Serum: 87 mg/dL (ref 87–352)

## 2018-01-02 NOTE — Telephone Encounter (Signed)
[employer] Auth: [id number] (exp. 01/02/18 to 02/16/18) order sent to GI they will contact the pt to schedule.

## 2018-01-03 ENCOUNTER — Telehealth: Payer: Self-pay | Admitting: *Deleted

## 2018-01-03 NOTE — Telephone Encounter (Signed)
-----   Message from Britt Bottom, MD sent at 01/02/2018 10:07 PM EDT ----- Please let the patient know that the lab work is fine.

## 2018-01-03 NOTE — Telephone Encounter (Signed)
LMOM with below lab results.  She does not need to return this call unless she has questons/fim

## 2018-01-08 ENCOUNTER — Ambulatory Visit
Admission: RE | Admit: 2018-01-08 | Discharge: 2018-01-08 | Disposition: A | Discharge status: Home / Self Care | Payer: 59 | Source: Ambulatory Visit | Attending: Neurology | Admitting: Neurology

## 2018-01-08 DIAGNOSIS — G35 Multiple sclerosis: Secondary | ICD-10-CM | POA: Diagnosis not present

## 2018-01-08 MED ORDER — GADOBENATE DIMEGLUMINE 529 MG/ML IV SOLN
15.0000 mL | Freq: Once | INTRAVENOUS | Status: AC | PRN
  Administered 2018-01-08: 15 mL via INTRAVENOUS

## 2018-01-10 ENCOUNTER — Telehealth: Payer: Self-pay | Admitting: *Deleted

## 2018-01-10 NOTE — Telephone Encounter (Signed)
I called pt to discuss, no answer, left a message asking her to call me back. 

## 2018-01-10 NOTE — Telephone Encounter (Signed)
-----   Message from Britt Bottom, MD sent at 01/09/2018  5:52 PM EDT ----- Please let her know that the MRI of the brain looks unchanged compared to the 2017 MRI.  No new lesions.

## 2018-01-10 NOTE — Telephone Encounter (Signed)
Spoke to patient she is aware of results

## 2018-02-13 DIAGNOSIS — R87610 Atypical squamous cells of undetermined significance on cytologic smear of cervix (ASC-US): Secondary | ICD-10-CM | POA: Diagnosis not present

## 2018-02-13 DIAGNOSIS — Z1231 Encounter for screening mammogram for malignant neoplasm of breast: Secondary | ICD-10-CM | POA: Diagnosis not present

## 2018-02-13 DIAGNOSIS — Z01419 Encounter for gynecological examination (general) (routine) without abnormal findings: Secondary | ICD-10-CM | POA: Diagnosis not present

## 2018-02-13 DIAGNOSIS — Z124 Encounter for screening for malignant neoplasm of cervix: Secondary | ICD-10-CM | POA: Diagnosis not present

## 2018-03-04 ENCOUNTER — Other Ambulatory Visit: Payer: Self-pay | Admitting: Obstetrics and Gynecology

## 2018-03-04 DIAGNOSIS — N870 Mild cervical dysplasia: Secondary | ICD-10-CM | POA: Diagnosis not present

## 2018-03-04 DIAGNOSIS — N72 Inflammatory disease of cervix uteri: Secondary | ICD-10-CM | POA: Diagnosis not present

## 2018-03-05 DIAGNOSIS — H40051 Ocular hypertension, right eye: Secondary | ICD-10-CM | POA: Diagnosis not present

## 2018-03-05 DIAGNOSIS — T86840 Corneal transplant rejection: Secondary | ICD-10-CM | POA: Diagnosis not present

## 2018-03-05 DIAGNOSIS — H25813 Combined forms of age-related cataract, bilateral: Secondary | ICD-10-CM | POA: Diagnosis not present

## 2018-03-19 ENCOUNTER — Telehealth: Payer: Self-pay | Admitting: *Deleted

## 2018-03-19 NOTE — Telephone Encounter (Signed)
Disability forms completed and returned to medical records/fim 

## 2018-03-20 DIAGNOSIS — T1501XA Foreign body in cornea, right eye, initial encounter: Secondary | ICD-10-CM | POA: Diagnosis not present

## 2018-03-20 DIAGNOSIS — S0501XA Injury of conjunctiva and corneal abrasion without foreign body, right eye, initial encounter: Secondary | ICD-10-CM | POA: Diagnosis not present

## 2018-03-20 DIAGNOSIS — H40051 Ocular hypertension, right eye: Secondary | ICD-10-CM | POA: Diagnosis not present

## 2018-03-20 DIAGNOSIS — Z0289 Encounter for other administrative examinations: Secondary | ICD-10-CM

## 2018-05-07 DIAGNOSIS — H40051 Ocular hypertension, right eye: Secondary | ICD-10-CM | POA: Diagnosis not present

## 2018-05-07 DIAGNOSIS — H5711 Ocular pain, right eye: Secondary | ICD-10-CM | POA: Diagnosis not present

## 2018-05-07 DIAGNOSIS — T86840 Corneal transplant rejection: Secondary | ICD-10-CM | POA: Diagnosis not present

## 2018-05-12 DIAGNOSIS — Z947 Corneal transplant status: Secondary | ICD-10-CM | POA: Diagnosis not present

## 2018-05-12 DIAGNOSIS — T86840 Corneal transplant rejection: Secondary | ICD-10-CM | POA: Diagnosis not present

## 2018-05-12 DIAGNOSIS — H40051 Ocular hypertension, right eye: Secondary | ICD-10-CM | POA: Diagnosis not present

## 2018-05-26 DIAGNOSIS — T86840 Corneal transplant rejection: Secondary | ICD-10-CM | POA: Diagnosis not present

## 2018-05-26 DIAGNOSIS — H40051 Ocular hypertension, right eye: Secondary | ICD-10-CM | POA: Diagnosis not present

## 2018-06-05 DIAGNOSIS — G35 Multiple sclerosis: Secondary | ICD-10-CM | POA: Diagnosis not present

## 2018-06-11 DIAGNOSIS — R002 Palpitations: Secondary | ICD-10-CM | POA: Diagnosis not present

## 2018-06-30 DIAGNOSIS — H2511 Age-related nuclear cataract, right eye: Secondary | ICD-10-CM | POA: Diagnosis not present

## 2018-06-30 DIAGNOSIS — H40051 Ocular hypertension, right eye: Secondary | ICD-10-CM | POA: Diagnosis not present

## 2018-06-30 DIAGNOSIS — T86840 Corneal transplant rejection: Secondary | ICD-10-CM | POA: Diagnosis not present

## 2018-07-07 ENCOUNTER — Encounter: Payer: Self-pay | Admitting: Neurology

## 2018-07-07 ENCOUNTER — Ambulatory Visit: Payer: 59 | Admitting: Neurology

## 2018-07-07 ENCOUNTER — Other Ambulatory Visit: Payer: Self-pay

## 2018-07-07 VITALS — BP 138/80 | HR 83 | Resp 16 | Ht 63.0 in | Wt 163.5 lb

## 2018-07-07 DIAGNOSIS — E559 Vitamin D deficiency, unspecified: Secondary | ICD-10-CM

## 2018-07-07 DIAGNOSIS — R200 Anesthesia of skin: Secondary | ICD-10-CM

## 2018-07-07 DIAGNOSIS — R269 Unspecified abnormalities of gait and mobility: Secondary | ICD-10-CM | POA: Diagnosis not present

## 2018-07-07 DIAGNOSIS — G35 Multiple sclerosis: Secondary | ICD-10-CM | POA: Diagnosis not present

## 2018-07-07 DIAGNOSIS — N3941 Urge incontinence: Secondary | ICD-10-CM

## 2018-07-07 NOTE — Progress Notes (Signed)
[facility]  PATIENT: Pamela Potter DOB: 1966/05/04  REFERRING DOCTOR OR PCP:  Dr. Starleen Blue    Dr. Maude Leriche is PCP [name] SOURCE: patient, records from Dr. Starleen Blue. Laboratory results, MRI results, multiple MRI images on PACS  _________________________________   HISTORICAL  CHIEF COMPLAINT:  Chief Complaint  Patient presents with  . Multiple Sclerosis    Sts. she continues to tolerate Ocrevus well.  Last inf. was 06/04/18.  Sts. over the last year she has noted frequent palpitations, dull mid to substernal chest pain. She has not discussed this with her pcp. Sts. she had normal EKG at urgent care. She wonders iff Ocrevus is contributing to palpitations/fim    HISTORY OF PRESENT ILLNESS:  Pamela Potter is a 52 y.o. woman with multiple sclerosis.  Update 07/07/2018: She feels her MS has generally done well since starting Ocrevus 2 years ago.    The 01/01/2018 labs showed normal IgG/igM/IgA and WBC.      MRI of the brain 01/09/2018 showed no changes compared to 02/10/2016 with a pattern of WM lesions c/w MS.    She notes a limp when tired or hot and the left leg occasionally gives out.    Last night, she has a burning sensation all over her body that has fluctuated since.  This has not happened in years.    She also noted a racing heart beat and dull chest pain at times the last year.   HR is never above 110.   She is on carvedilol bid.     She has urinary urgency and rare urge incontinence.  Mood is doing well.   She denies anxiety.   She notes mild cognitive issues and occasionally states things wrong and she notes some difficulty remembering names.   Sleep is good most nights but she has nocturia.   Of note, she is on dyazide and prefers to take at night as she is always near a bathroom.        Update 01/01/2018: She is doing well and continues on Ocrelizumab (first infusion mid 2017 and last infusion 12/05/2017).  She tolerates Ocrevus well but not Benadryl  (very sleepy and speech issues).   She denies exacerbation.   Gait is doing well with no falls.   She denies weakness.   She has bilateral foot numbness, left > right.   She stopped Flexeril as spasms are better.  Bladder function is unchanged with frequency, urgency occasional incontinence, requiring wearing pads.  She prefers not to start another medication.    She has had cornea transplants but no MS related issues.   She sees ophthalmology regularly.     She notes more fatigue but notes more stress with her best friend in the hospital x 6 weeks and home repair issues.    She tries to sleep 7 hours.    She notes mild cognitive issues with word finding issues and decreased focus/attention and mild reduced STM.        From 10/11/2016: She was diagnosed with MS in 1991 after presenting with gait disturbance, weakness and vertigo. An MRI of the brain showed plaques and a lumbar puncture were reportedly consistent with MS. She saw Dr. Adolphus Birchwood a couple years later and was started her on Avonex.   She stayed on Avonex for about 10 years but then had new lesions on her MRI. She was referred to Dr. Jacqulynn Cadet. He placed her on Tysabri and she stayed on for a few  years. 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 Unfortunately, on Gilenya, she had some MRI progression and Tysabri. Of note, her JCV antibody titers were initially less than 0.9. However, over the last couple years her titers have increased and her last JCV antibody was 1.03. Around June, she was switched to ocrelizumab. She had her first dose the end of June and her second dose in mid July. She has not noted any exacerbations while on ocrelizumab.  Gait/strength/sensation: Since her first exacerbation in 1991, she has had some difficulty with her balance and has had numbness in her feet she notes that her balance has worsened a little bit since then but has been fairly stable over the past  few years. She stumbles but she has not had any recent falls. She notes that her legs sometimes feel a little bit weak. This used to be worse on the right but now is worse on the left.  Bladder/bowel: She has urinary urgency and frequency. She also has nocturia several times a night. She has had incontinence and wears pads. She denies constipation.  Vision: She denies any MS related visual problems and has not had optic neuritis or diplopia. 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  Fatigue/sleep: She does not report any major problems with fatigue. She does have some insomnia at times but is able to sleep in late so this has not been troublesome. She wakes up several times a night to use the bathroom.   Mood/cognition: She denies any depression or anxiety. She has noted mild cognitive issues including problems coming up with the right words and decreased focus and attention  I personally reviewed the MRI images from 02/10/2016 I compare them to the MRI from 05/17/2014. She has classic T2/FLAIR hyperintense foci in the periventricular, juxtacortical and deep white matter of both hemispheres and also in the pons and right middle cerebellar peduncle in a pattern and configuration consistent with chronic demyelinating plaque associated with MS.   There were no acute findings and no change when compared to the previous MRI. The MRI of the cervical and thoracic spine showed foci in the upper cervical spine and in the mid to lower thoracic spine. None of these were acute.    I reviewed her labs. Her JCV antibody on 12/25/2015 was 1.03 (middle positive). She had normal CD4 and CD8 cells.   Hepatitis B labs and Quantiferon TB test were negative or nonreactive.   REVIEW OF SYSTEMS: Constitutional: No fevers, chills, sweats, or change in appetite.  Reports fatigue eyes: as abvove Ear, nose and throat: No hearing loss, ear pain, nasal congestion, sore throat Cardiovascular: No chest  pain, palpitations Respiratory: No shortness of breath at rest or with exertion.   No wheezes GastrointestinaI: No nausea, vomiting, diarrhea, abdominal pain, fecal incontinence Genitourinary: Notes frequency and nocturia with occasional urge incontinence Musculoskeletal: No neck pain, back pain Integumentary: No rash, pruritus, skin lesions Neurological: as above Psychiatric: No depression at this time.  She denies anxiety. Endocrine: No palpitations, diaphoresis, change in appetite, change in weigh or increased thirst Hematologic/Lymphatic: No anemia, purpura, petechiae. Allergic/Immunologic: No itchy/runny eyes, nasal congestion, recent allergic reactions, rashes  ALLERGIES: Allergies  Allergen Reactions  . Ace Inhibitors Swelling    lips  . Sulfa Antibiotics Hives and Itching  . Amoxicillin Diarrhea  . Benadryl [Diphenhydramine] Other (See Comments)    Difficulty swallowing  . Dilantin [Phenytoin Sodium Extended]   .  Lisinopril   . Norvasc [Amlodipine Besylate]     HOME MEDICATIONS:  Current Outpatient Medications:  .  carvedilol (COREG) 3.125 MG tablet, Take 3.125 mg by mouth 2 (two) times daily with a meal., Disp: , Rfl:  .  hydrochlorothiazide (MICROZIDE) 12.5 MG capsule, Take 12.5 mg by mouth daily., Disp: , Rfl:  .  levothyroxine (SYNTHROID, LEVOTHROID) 50 MCG tablet, Take 50 mcg by mouth daily before breakfast., Disp: , Rfl:  .  NORTREL 7/7/7 0.5/0.75/1-35 MG-MCG tablet, TAKE 1 TABLET(S) EVERY DAY BY ORAL ROUTE., Disp: , Rfl: 0 .  ocrelizumab 600 mg in sodium chloride 0.9 % 500 mL, Inject 600 mg into the vein every 6 (six) months., Disp: , Rfl:  .  prednisoLONE acetate (PRED MILD) 0.12 % ophthalmic suspension, 1 drop 4 (four) times daily., Disp: , Rfl:  .  timolol (BETIMOL) 0.5 % ophthalmic solution, Place 1 drop into the right eye daily., Disp: , Rfl:  .  triamterene-hydrochlorothiazide (DYAZIDE) 37.5-25 MG capsule, Take 1 capsule by mouth daily., Disp: , Rfl:    PAST MEDICAL HISTORY: Past Medical History:  Diagnosis Date  . Anemia   . Hypertension   . Multiple sclerosis ([place])   . Neuromuscular disorder ([place])   . Vertigo   . Vision abnormalities     PAST SURGICAL HISTORY: Past Surgical History:  Procedure Laterality Date  . EYE SURGERY      FAMILY HISTORY: Family History  Problem Relation Age of Onset  . Hyperlipidemia Mother   . Hypertension Mother   . Stroke Father   . Diabetes Father     SOCIAL HISTORY:  Social History   Socioeconomic History  . Marital status: Married    Spouse name: Not on file  . Number of children: Not on file  . Years of education: Not on file  . Highest education level: Not on file  Occupational History  . Not on file  Social Needs  . Financial resource strain: Not on file  . Food insecurity:    Worry: Not on file    Inability: Not on file  . Transportation needs:    Medical: Not on file    Non-medical: Not on file  Tobacco Use  . Smoking status: Never Smoker  . Smokeless tobacco: Never Used  Substance and Sexual Activity  . Alcohol use: Not on file  . Drug use: Not on file  . Sexual activity: Not on file  Lifestyle  . Physical activity:    Days per week: Not on file    Minutes per session: Not on file  . Stress: Not on file  Relationships  . Social connections:    Talks on phone: Not on file    Gets together: Not on file    Attends religious service: Not on file    Active member of club or organization: Not on file    Attends meetings of clubs or organizations: Not on file    Relationship status: Not on file  . Intimate partner violence:    Fear of current or ex partner: Not on file    Emotionally abused: Not on file    Physically abused: Not on file    Forced sexual activity: Not on file  Other Topics Concern  . Not on file  Social History Narrative  . Not on file     PHYSICAL EXAM  Vitals:   07/07/18 1052  BP: 138/80  Pulse: 83  Resp: 16  Weight: 163 lb 8 oz  (74.2  kg)  Height: 5\' 3"  (1.6 m)    Body mass index is 28.96 kg/m.   General: The patient is well-developed and well-nourished and in no acute distress   Neurologic Exam  Mental status: The patient is alert and oriented x 3 at the time of the examination. The patient has apparent normal recent and remote memory, with an apparently normal attention span and concentration ability.   Speech is normal.  Cranial nerves: Extraocular movements are full.  Facial strength and sensation are normal.  Trapezius strength is good.  The tongue is midline, and the patient has symmetric elevation of the soft palate. No obvious hearing deficits are noted.  Motor:  Muscle bulk is normal.   Mildly increased tone in legs.. Strength is  5 / 5 in all 4 extremities.   Sensory: Intact touch and vibration in te arms and legs.  Coordination:   She has good FTN slightly reduced heel to shin bilaterally  Gait and station: Station is normal.   Gait is mildly wide and tandem gait is wide. Romberg is negative.   Reflexes: Deep tendon reflexes are symmetric and normal in both arms. DTRs are increased in legs with spread at the knees.      DIAGNOSTIC DATA (LABS, IMAGING, TESTING) - I reviewed patient records, labs, notes, testing and imaging myself where available.      ASSESSMENT AND PLAN  Multiple sclerosis (HCC)  Gait disturbance  Urge incontinence of urine  Numbness  1.   Ocrevus 600 mg q 6 months IV.   Her next dose will be in February..     Check CBC/Diff, CMP 2.  She prefers not to be placed on medicine for her urinary urgency with rare incontinence.. 3.   Stay active and exercise as tolerated 4.   OTC vit D, check Vit D 5.   rtc 6 months or soonr if new or worsening neurologic issues  Amairany Schumpert A. Felecia Shelling, MD, PhD 2/48/2500, 37:04 AM Certified in Neurology, Clinical Neurophysiology, Sleep Medicine, Pain Medicine and Neuroimaging  [facility] [address], [place], [place] [address] [phone]

## 2018-07-08 LAB — CBC WITH DIFFERENTIAL/PLATELET
BASOS: 1 %
Basophils Absolute: 0 10*3/uL (ref 0.0–0.2)
EOS (ABSOLUTE): 0.2 10*3/uL (ref 0.0–0.4)
EOS: 5 %
HEMATOCRIT: 39.9 % (ref 34.0–46.6)
Hemoglobin: 12.6 g/dL (ref 11.1–15.9)
IMMATURE GRANS (ABS): 0 10*3/uL (ref 0.0–0.1)
IMMATURE GRANULOCYTES: 0 %
LYMPHS: 24 %
Lymphocytes Absolute: 1 10*3/uL (ref 0.7–3.1)
MCH: 25.5 pg — ABNORMAL LOW (ref 26.6–33.0)
MCHC: 31.6 g/dL (ref 31.5–35.7)
MCV: 81 fL (ref 79–97)
MONOS ABS: 0.4 10*3/uL (ref 0.1–0.9)
Monocytes: 11 %
Neutrophils Absolute: 2.5 10*3/uL (ref 1.4–7.0)
Neutrophils: 59 %
Platelets: 406 10*3/uL (ref 150–450)
RBC: 4.95 x10E6/uL (ref 3.77–5.28)
RDW: 15.3 % (ref 12.3–15.4)
WBC: 4.1 10*3/uL (ref 3.4–10.8)

## 2018-07-08 LAB — COMPREHENSIVE METABOLIC PANEL
ALT: 10 IU/L (ref 0–32)
AST: 14 IU/L (ref 0–40)
Albumin/Globulin Ratio: 1.8 (ref 1.2–2.2)
Albumin: 4.4 g/dL (ref 3.5–5.5)
Alkaline Phosphatase: 123 IU/L — ABNORMAL HIGH (ref 39–117)
BUN/Creatinine Ratio: 15 (ref 9–23)
BUN: 19 mg/dL (ref 6–24)
Bilirubin Total: 0.3 mg/dL (ref 0.0–1.2)
CO2: 22 mmol/L (ref 20–29)
Calcium: 9.7 mg/dL (ref 8.7–10.2)
Chloride: 97 mmol/L (ref 96–106)
Creatinine, Ser: 1.23 mg/dL — ABNORMAL HIGH (ref 0.57–1.00)
GFR, EST AFRICAN AMERICAN: 58 mL/min/{1.73_m2} — AB (ref >59)
GFR, EST NON AFRICAN AMERICAN: 50 mL/min/{1.73_m2} — AB (ref >59)
GLOBULIN, TOTAL: 2.4 g/dL (ref 1.5–4.5)
Glucose: 83 mg/dL (ref 65–99)
Potassium: 4.3 mmol/L (ref 3.5–5.2)
SODIUM: 136 mmol/L (ref 134–144)
TOTAL PROTEIN: 6.8 g/dL (ref 6.0–8.5)

## 2018-07-08 LAB — VITAMIN D 25 HYDROXY (VIT D DEFICIENCY, FRACTURES): Vit D, 25-Hydroxy: 29.4 ng/mL — ABNORMAL LOW (ref 30.0–100.0)

## 2018-07-10 ENCOUNTER — Telehealth: Payer: Self-pay | Admitting: *Deleted

## 2018-07-10 NOTE — Telephone Encounter (Signed)
LMTC./fim 

## 2018-07-10 NOTE — Telephone Encounter (Signed)
-----   Message from Britt Bottom, MD sent at 07/10/2018  4:39 PM EDT ----- Vitamin D is a little low.  She can take 4000 and 5000 units OTC.  Daily  Her creatinine, a measure of kidney function, was slightly elevated.  If this has not been elevated in the past, we should recheck BMP in 2 months or she should discuss further with her primary care.

## 2018-07-10 NOTE — Telephone Encounter (Signed)
Spoke with Pamela Potter and reviewed below lab results and need for otc Vit. D 5,000u once daily.  She sts.  nobody has every mentioned any renal impairment to her. We do not have past renal labs for comparison. Pt. has an appt. with her pcp in December. I have mailed a copy of her labs to her home address so she can take them with her to her pcp/fim

## 2018-08-13 DIAGNOSIS — Z23 Encounter for immunization: Secondary | ICD-10-CM | POA: Diagnosis not present

## 2018-09-08 DIAGNOSIS — H40051 Ocular hypertension, right eye: Secondary | ICD-10-CM | POA: Diagnosis not present

## 2018-09-08 DIAGNOSIS — Z947 Corneal transplant status: Secondary | ICD-10-CM | POA: Diagnosis not present

## 2018-09-08 DIAGNOSIS — H2011 Chronic iridocyclitis, right eye: Secondary | ICD-10-CM | POA: Diagnosis not present

## 2018-09-22 DIAGNOSIS — G35 Multiple sclerosis: Secondary | ICD-10-CM | POA: Diagnosis not present

## 2018-09-22 DIAGNOSIS — I10 Essential (primary) hypertension: Secondary | ICD-10-CM | POA: Diagnosis not present

## 2018-09-22 DIAGNOSIS — R7303 Prediabetes: Secondary | ICD-10-CM | POA: Diagnosis not present

## 2018-09-22 DIAGNOSIS — E039 Hypothyroidism, unspecified: Secondary | ICD-10-CM | POA: Diagnosis not present

## 2018-09-22 DIAGNOSIS — E559 Vitamin D deficiency, unspecified: Secondary | ICD-10-CM | POA: Diagnosis not present

## 2018-09-22 DIAGNOSIS — Z0000 Encounter for general adult medical examination without abnormal findings: Secondary | ICD-10-CM | POA: Diagnosis not present

## 2018-10-30 DIAGNOSIS — E039 Hypothyroidism, unspecified: Secondary | ICD-10-CM | POA: Diagnosis not present

## 2018-10-30 DIAGNOSIS — D72819 Decreased white blood cell count, unspecified: Secondary | ICD-10-CM | POA: Diagnosis not present

## 2018-11-20 ENCOUNTER — Telehealth: Payer: Self-pay | Admitting: *Deleted

## 2018-11-20 NOTE — Telephone Encounter (Signed)
Received letter dated 11/12/18 from [facility] that [place] approved 11/12/18-11/13/19. Auth #: Ref#: [id number]. Pt ID#: [id number]. Gave to [facility] Barnett Applebaum) for their records.

## 2018-12-03 DIAGNOSIS — D72819 Decreased white blood cell count, unspecified: Secondary | ICD-10-CM | POA: Diagnosis not present

## 2018-12-04 DIAGNOSIS — G35 Multiple sclerosis: Secondary | ICD-10-CM | POA: Diagnosis not present

## 2018-12-11 DIAGNOSIS — H16102 Unspecified superficial keratitis, left eye: Secondary | ICD-10-CM | POA: Diagnosis not present

## 2018-12-11 DIAGNOSIS — Z947 Corneal transplant status: Secondary | ICD-10-CM | POA: Diagnosis not present

## 2018-12-11 DIAGNOSIS — H40051 Ocular hypertension, right eye: Secondary | ICD-10-CM | POA: Diagnosis not present

## 2019-01-05 ENCOUNTER — Telehealth: Payer: Self-pay | Admitting: Neurology

## 2019-01-05 NOTE — Telephone Encounter (Signed)
Spoke to pt again, as she wanted to have the virtual video appt.  Placed at 1130 appt as previously scheduled.  Redid.  I went over allergies, medications, history med/ surg changes.   Pt understands that although there may be some limitations with this type of visit, we will take all precautions to reduce any security or privacy concerns.  Pt understands that this will be treated like an in office visit and we will file with pt's insurance, and there may be a patient responsible charge related to this service. Pt understands that the [employer] webex software must be downloaded and operational on the device pt plans to use for the visit.  She will call back if needed for troubleshooting. Her email is: [email or web address] is her email.

## 2019-01-05 NOTE — Telephone Encounter (Signed)
Pt has changed her mind, she spoke with a coworker who encouraged to keep the appt for tomorrow. Please call to advise if she can be seen tomorrow.

## 2019-01-06 ENCOUNTER — Ambulatory Visit (INDEPENDENT_AMBULATORY_CARE_PROVIDER_SITE_OTHER): Payer: 59 | Admitting: Neurology

## 2019-01-06 ENCOUNTER — Ambulatory Visit: Payer: 59 | Admitting: Neurology

## 2019-01-06 ENCOUNTER — Encounter: Payer: Self-pay | Admitting: Neurology

## 2019-01-06 ENCOUNTER — Other Ambulatory Visit: Payer: Self-pay

## 2019-01-06 DIAGNOSIS — G35 Multiple sclerosis: Secondary | ICD-10-CM | POA: Diagnosis not present

## 2019-01-06 DIAGNOSIS — R269 Unspecified abnormalities of gait and mobility: Secondary | ICD-10-CM

## 2019-01-06 DIAGNOSIS — R200 Anesthesia of skin: Secondary | ICD-10-CM

## 2019-01-06 DIAGNOSIS — N3941 Urge incontinence: Secondary | ICD-10-CM

## 2019-01-06 NOTE — Progress Notes (Signed)
[facility]  PATIENT: Pamela Potter DOB: 13-Aug-1966  REFERRING DOCTOR OR PCP:  Dr. Starleen Blue    Dr. Maude Leriche is PCP [name] SOURCE: patient, records from Dr. Starleen Blue. Laboratory results, MRI results, multiple MRI images on PACS  _________________________________   HISTORICAL  CHIEF COMPLAINT:  Chief Complaint  Patient presents with   Multiple Sclerosis    Last Ocrevus February 2020    HISTORY OF PRESENT ILLNESS:  Pamela Potter is a 53 y.o. woman with multiple sclerosis.  Update 01/06/2019: Virtual Visit via Video Note  I connected with Federico Flake on 01/06/19 at 11:30 AM EDT by a video enabled telemedicine application and verified that I am speaking with the correct person using two identifiers.   I discussed the limitations of evaluation and management by telemedicine and the availability of in person appointments. The patient expressed understanding and agreed to proceed.  History of Present Illness: She feels her MS has been stable.  There have not been any exacerbations or new symptoms.  Her last Ocrevus infusion was in February.  She has now been on Ocrevus for over 2 years.  Last year we did check the IgG and IgM and they were normal.  Her creatinine was a little bit elevated and she has had follow-up lab work with her primary care and was told that the kidneys look good.  The MRI of the brain 01/09/2018 was stable compared to the 02/10/2016 MRI.   At the last visit, vitamin D was slightly low and she takes supplements.  Her gait is doing about the same.  She has a slight limp at times especially when she is tired or hot.  The left leg is slightly worse than the right leg.  She does have some burning sensations in the body but these are not troubling and do not need to be treated.  She has some urinary urgency and rarely has had urge incontinence but this is stable.  Vision is fine.  Mood is doing well.  She denies any depression or anxiety though  does feel a little bit stressed with her son and husband having to continue to work during the [place] and 19 pandemic.  She feels cognitive functions are stable.  Occasionally she has some word finding difficulties.  She is sleeping 7 to 8 hours a day but typically will split this into two 3 to 4-hour blocks with 2 to 3 hours of being awake in the middle of the night.  This does not bother her.  Observations/Objective: She is a well-developed well-nourished woman in no acute distress.  The head is normocephalic and atraumatic.  The neck and shoulders have normal range of motion.  She is alert and fully oriented with fluent speech and reasonably good focus, attention and memory.  Extraocular muscles were intact.  Facial strength and trapezius strength was normal.  Hearing appeared to be normal.  Strength and coordination appeared to be normal in the arms.  Station was normal with the eyes open closed.  Her gait was slightly wide.  Sensation and reflexes could not be checked.  Assessment and Plan: Multiple sclerosis (HCC)  Gait disturbance  Urge incontinence of urine  Numbness   1.   Continue Ocrevus.  Her next infusion will be around August.  We will check lab work around that time.  If the numbness becomes more painful consider gabapentin or other agent.  Consider an MRI later this year or next year to determine if there  is any subclinical progression that would make [place] consider a different disease modifying therapy. 2.   Try to stay active and exercise as tolerated. 3.   She will return to see me in 6 months or sooner if there are new or worsening neurologic symptoms.  Follow Up Instructions: I discussed the assessment and treatment plan with the patient. The patient was provided an opportunity to ask questions and all were answered. The patient agreed with the plan and demonstrated an understanding of the instructions.   The patient was advised to call back or seek an in-person evaluation if  the symptoms worsen or if the condition fails to improve as anticipated.  I provided 25 minutes of non-face-to-face time during this encounter.  Jazmyne Beauchesne A. Felecia Shelling, MD, PhD, FAAN Certified in Neurology, Clinical Neurophysiology, Sleep Medicine, Pain Medicine and Neuroimaging Director, [facility] [address], [place], [place] [address] [phone]  _____________________________________________________________ From previous visits Update 07/07/2018: She feels her MS has generally done well since starting [employer] 2 years ago.    The 01/01/2018 labs showed normal IgG/igM/IgA and WBC.      MRI of the brain 01/09/2018 showed no changes compared to 02/10/2016 with a pattern of WM lesions c/w MS.    She notes a limp when tired or hot and the left leg occasionally gives out.    Last night, she has a burning sensation all over her body that has fluctuated since.  This has not happened in years.    She also noted a racing heart beat and dull chest pain at times the last year.   HR is never above 110.   She is on carvedilol bid.     She has urinary urgency and rare urge incontinence.  Mood is doing well.   She denies anxiety.   She notes mild cognitive issues and occasionally states things wrong and she notes some difficulty remembering names.   Sleep is good most nights but she has nocturia.   Of note, she is on dyazide and prefers to take at night as she is always near a bathroom.        Update 01/01/2018: She is doing well and continues on Ocrelizumab (first infusion mid 2017 and last infusion 12/05/2017).  She tolerates Ocrevus well but not Benadryl (very sleepy and speech issues).   She denies exacerbation.   Gait is doing well with no falls.   She denies weakness.   She has bilateral foot numbness, left > right.   She stopped Flexeril as spasms are better.  Bladder function is unchanged with frequency, urgency  occasional incontinence, requiring wearing pads.  She prefers not to start another medication.    She has had cornea transplants but no MS related issues.   She sees ophthalmology regularly.     She notes more fatigue but notes more stress with her best friend in the hospital x 6 weeks and home repair issues.    She tries to sleep 7 hours.    She notes mild cognitive issues with word finding issues and decreased focus/attention and mild reduced STM.        From 10/11/2016: She was diagnosed with MS in 1991 after presenting with gait disturbance, weakness and vertigo. An MRI of the brain showed plaques and a lumbar puncture were reportedly consistent with MS. She saw Dr. Adolphus Birchwood a couple years later and was started her on Avonex.   She  stayed on Avonex for about 10 years but then had new lesions on her MRI. She was referred to Dr. Jacqulynn Cadet. He placed her on Tysabri and she stayed on for a few years. 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 Unfortunately, on Gilenya, she had some MRI progression and Tysabri. Of note, her JCV antibody titers were initially less than 0.9. However, over the last couple years her titers have increased and her last JCV antibody was 1.03. Around June, she was switched to ocrelizumab. She had her first dose the end of June and her second dose in mid July. She has not noted any exacerbations while on ocrelizumab.  Gait/strength/sensation: Since her first exacerbation in 1991, she has had some difficulty with her balance and has had numbness in her feet she notes that her balance has worsened a little bit since then but has been fairly stable over the past few years. She stumbles but she has not had any recent falls. She notes that her legs sometimes feel a little bit weak. This used to be worse on the right but now is worse on the left.  Bladder/bowel: She has urinary urgency and frequency. She also has nocturia  several times a night. She has had incontinence and wears pads. She denies constipation.  Vision: She denies any MS related visual problems and has not had optic neuritis or diplopia. 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  Fatigue/sleep: She does not report any major problems with fatigue. She does have some insomnia at times but is able to sleep in late so this has not been troublesome. She wakes up several times a night to use the bathroom.   Mood/cognition: She denies any depression or anxiety. She has noted mild cognitive issues including problems coming up with the right words and decreased focus and attention  I personally reviewed the MRI images from 02/10/2016 I compare them to the MRI from 05/17/2014. She has classic T2/FLAIR hyperintense foci in the periventricular, juxtacortical and deep white matter of both hemispheres and also in the pons and right middle cerebellar peduncle in a pattern and configuration consistent with chronic demyelinating plaque associated with MS.   There were no acute findings and no change when compared to the previous MRI. The MRI of the cervical and thoracic spine showed foci in the upper cervical spine and in the mid to lower thoracic spine. None of these were acute.    I reviewed her labs. Her JCV antibody on 12/25/2015 was 1.03 (middle positive). She had normal CD4 and CD8 cells.   Hepatitis B labs and Quantiferon TB test were negative or nonreactive.   REVIEW OF SYSTEMS: Constitutional: No fevers, chills, sweats, or change in appetite.  Reports fatigue eyes: as abvove Ear, nose and throat: No hearing loss, ear pain, nasal congestion, sore throat Cardiovascular: No chest pain, palpitations Respiratory: No shortness of breath at rest or with exertion.   No wheezes GastrointestinaI: No nausea, vomiting, diarrhea, abdominal pain, fecal incontinence Genitourinary: Notes frequency and nocturia with occasional urge  incontinence Musculoskeletal: No neck pain, back pain Integumentary: No rash, pruritus, skin lesions Neurological: as above Psychiatric: No depression at this time.  She denies anxiety. Endocrine: No palpitations, diaphoresis, change in appetite, change in weigh or increased thirst Hematologic/Lymphatic: No anemia, purpura, petechiae. Allergic/Immunologic: No itchy/runny eyes, nasal congestion, recent allergic reactions, rashes  ALLERGIES: Allergies  Allergen Reactions   Ace Inhibitors Swelling  lips   Sulfa Antibiotics Hives and Itching   Amoxicillin Diarrhea   Benadryl [Diphenhydramine] Other (See Comments)    Difficulty swallowing   Dilantin [Phenytoin Sodium Extended]    Lisinopril    Norvasc [Amlodipine Besylate]     HOME MEDICATIONS:  Current Outpatient Medications:    carvedilol (COREG) 3.125 MG tablet, Take 3.125 mg by mouth 2 (two) times daily with a meal. Prn, Disp: , Rfl:    hydrochlorothiazide (MICROZIDE) 12.5 MG capsule, Take 12.5 mg by mouth daily as needed. , Disp: , Rfl:    levothyroxine (SYNTHROID, LEVOTHROID) 50 MCG tablet, Take 50 mcg by mouth daily before breakfast., Disp: , Rfl:    ocrelizumab 600 mg in sodium chloride 0.9 % 500 mL, Inject 600 mg into the vein every 6 (six) months., Disp: , Rfl:    prednisoLONE acetate (PRED MILD) 0.12 % ophthalmic suspension, 1 drop 4 (four) times daily., Disp: , Rfl:    timolol (BETIMOL) 0.5 % ophthalmic solution, Place 1 drop into the right eye daily., Disp: , Rfl:    triamterene-hydrochlorothiazide (DYAZIDE) 37.5-25 MG capsule, Take 1 capsule by mouth daily., Disp: , Rfl:   PAST MEDICAL HISTORY: Past Medical History:  Diagnosis Date   Anemia    Hypertension    Multiple sclerosis ([place])    Neuromuscular disorder ([place])    Vertigo    Vision abnormalities     PAST SURGICAL HISTORY: Past Surgical History:  Procedure Laterality Date   EYE SURGERY      FAMILY HISTORY: Family History   Problem Relation Age of Onset   Hyperlipidemia Mother    Hypertension Mother    Stroke Father    Diabetes Father     SOCIAL HISTORY:  Social History   Socioeconomic History   Marital status: Married    Spouse name: Not on file   Number of children: Not on file   Years of education: Not on file   Highest education level: Not on file  Occupational History   Not on file  Social Needs   Financial resource strain: Not on file   Food insecurity:    Worry: Not on file    Inability: Not on file   Transportation needs:    Medical: Not on file    Non-medical: Not on file  Tobacco Use   Smoking status: Never Smoker   Smokeless tobacco: Never Used  Substance and Sexual Activity   Alcohol use: Not on file   Drug use: Not on file   Sexual activity: Not on file  Lifestyle   Physical activity:    Days per week: Not on file    Minutes per session: Not on file   Stress: Not on file  Relationships   Social connections:    Talks on phone: Not on file    Gets together: Not on file    Attends religious service: Not on file    Active member of club or organization: Not on file    Attends meetings of clubs or organizations: Not on file    Relationship status: Not on file   Intimate partner violence:    Fear of current or ex partner: Not on file    Emotionally abused: Not on file    Physically abused: Not on file    Forced sexual activity: Not on file  Other Topics Concern   Not on file  Social History Narrative   Not on file     PHYSICAL EXAM  There were no vitals  filed for this visit.  There is no height or weight on file to calculate BMI.   General: The patient is well-developed and well-nourished and in no acute distress   Neurologic Exam  Mental status: The patient is alert and oriented x 3 at the time of the examination. The patient has apparent normal recent and remote memory, with an apparently normal attention span and concentration  ability.   Speech is normal.  Cranial nerves: Extraocular movements are full.  Facial strength and sensation are normal.  Trapezius strength is good.  The tongue is midline, and the patient has symmetric elevation of the soft palate. No obvious hearing deficits are noted.  Motor:  Muscle bulk is normal.   Mildly increased tone in legs.. Strength is  5 / 5 in all 4 extremities.   Sensory: Intact touch and vibration in te arms and legs.  Coordination:   She has good FTN slightly reduced heel to shin bilaterally  Gait and station: Station is normal.   Gait is mildly wide and tandem gait is wide. Romberg is negative.   Reflexes: Deep tendon reflexes are symmetric and normal in both arms. DTRs are increased in legs with spread at the knees.

## 2019-03-18 ENCOUNTER — Ambulatory Visit: Payer: Self-pay | Admitting: Neurology

## 2019-04-22 DIAGNOSIS — Z0289 Encounter for other administrative examinations: Secondary | ICD-10-CM

## 2019-05-05 ENCOUNTER — Telehealth: Payer: Self-pay | Admitting: *Deleted

## 2019-05-05 NOTE — Telephone Encounter (Signed)
Gave completed/signed [employer] [facility] paperwork (Attending physician statement) back to medical records to process for pt.

## 2019-05-11 ENCOUNTER — Telehealth: Payer: Self-pay | Admitting: Neurology

## 2019-05-11 NOTE — Telephone Encounter (Signed)
error 

## 2019-05-11 NOTE — Telephone Encounter (Signed)
[facility] paperwork (Attending physician statement) has been faxed-received confirmation. I also mailed a copy to the pt upon her request/verified her mailing address

## 2019-06-17 ENCOUNTER — Telehealth: Payer: Self-pay | Admitting: Neurology

## 2019-06-17 NOTE — Telephone Encounter (Signed)
Dr. Krista Blue- what is your recommendation? She is on Ocrevus for MS. This is a Dr. Felecia Shelling patient.

## 2019-06-17 NOTE — Telephone Encounter (Addendum)
She should get 125mg  solumedrol during her most recent Ocrevus infusion in August 2020?  I see no absolute contraindication for patient to be treated for Decadron, where it is the pain, primary care should be given information about her recent steroid treatment

## 2019-06-17 NOTE — Telephone Encounter (Signed)
Pamela Potter from Dr. Dorian Heckle office called stating that the pt was seen today at the office and is complaining of a lot of pain and they would like to know if the pt can be prescribed Decadron and it now interact with the pt's infusions and MS medication's. Please advise.

## 2019-06-17 NOTE — Telephone Encounter (Signed)
Checked with intrafusion. Her last Ocrevus infusion was 05/28/19 and next one scheduled for 11/19/19.

## 2019-06-17 NOTE — Telephone Encounter (Signed)
Returned call to Dr. Janeann Forehand office and spoke with Janett Billow. Relayed per Dr. Krista Blue that there is no contraindication for her to receive decadron. Last Ocrevus 05/28/19 and next one 11/19/19. She did received 125mg  IV solumedrol at infusion on 05/28/19. She verbalized understanding and will give message to [facility]

## 2019-07-08 ENCOUNTER — Ambulatory Visit: Payer: 59 | Admitting: Neurology

## 2019-07-08 ENCOUNTER — Other Ambulatory Visit: Payer: Self-pay

## 2019-07-08 ENCOUNTER — Encounter: Payer: Self-pay | Admitting: Neurology

## 2019-07-08 VITALS — BP 114/80 | HR 97 | Temp 97.8°F | Ht 63.0 in | Wt 157.5 lb

## 2019-07-08 DIAGNOSIS — G35 Multiple sclerosis: Secondary | ICD-10-CM | POA: Diagnosis not present

## 2019-07-08 DIAGNOSIS — Z79899 Other long term (current) drug therapy: Secondary | ICD-10-CM | POA: Diagnosis not present

## 2019-07-08 DIAGNOSIS — E559 Vitamin D deficiency, unspecified: Secondary | ICD-10-CM

## 2019-07-08 DIAGNOSIS — R200 Anesthesia of skin: Secondary | ICD-10-CM | POA: Diagnosis not present

## 2019-07-08 DIAGNOSIS — R269 Unspecified abnormalities of gait and mobility: Secondary | ICD-10-CM | POA: Diagnosis not present

## 2019-07-08 DIAGNOSIS — G501 Atypical facial pain: Secondary | ICD-10-CM

## 2019-07-08 NOTE — Progress Notes (Addendum)
[facility]  PATIENT: Pamela Potter DOB: 01-24-1966  REFERRING DOCTOR OR PCP:  Dr. Starleen Blue    Dr. Maude Leriche is PCP Pamela Potter [facility] SOURCE: patient, records from Dr. Starleen Blue. Laboratory results, MRI results, multiple MRI images on PACS  _________________________________   HISTORICAL  CHIEF COMPLAINT:  Chief Complaint  Patient presents with  . Follow-up    RM 13. Last seen 01/06/2019.   . Multiple Sclerosis    On Ocrevus. Last infusion 05/28/2019. Next one: 11/19/19    HISTORY OF PRESENT ILLNESS:  [name] is a 53 y.o. woman with relapsing remitting multiple sclerosis.  Update 07/08/2019: She has been stable on Ocrevus and tolerates the infusions well.  However, she got very sleepy with slurred speech and a sensation of her throat closing with the Benadryl.    She has no definite exacerbations but does note pain in the right face that started last month.   Dentists have not found a cause.   Steroids helped temporarily.   She gets sharp pain, worse if she lays on her right side.   Pain lasts a few seconds to a minute.  She had a few spells yesterday, none yet today.        She is taking vit D  Update 01/06/2019 (virtual):  She feels her MS has been stable.  There have not been any exacerbations or new symptoms.  Her last Ocrevus infusion was in February.  She has now been on Ocrevus for over 2 years.  Last year we did check the IgG and IgM and they were normal.  Her creatinine was a little bit elevated and she has had follow-up lab work with her primary care and was told that the kidneys look good.  The MRI of the brain 01/09/2018 was stable compared to the 02/10/2016 MRI.   At the last visit, vitamin D was slightly low and she takes supplements.  Her gait is doing about the same.  She has a slight limp at times especially when she is tired or hot.  The left leg is slightly worse than the right leg.  She does have some burning sensations in the body but these are  not troubling and do not need to be treated.  She has some urinary urgency and rarely has had urge incontinence but this is stable.  Vision is fine.  Mood is doing well.  She denies any depression or anxiety though does feel a little bit stressed with her son and husband having to continue to work during the [place] and 19 pandemic.  She feels cognitive functions are stable.  Occasionally she has some word finding difficulties.  She is sleeping 7 to 8 hours a day but typically will split this into two 3 to 4-hour blocks with 2 to 3 hours of being awake in the middle of the night.  This does not bother her.  Update 07/07/2018: She feels her MS has generally done well since starting Ocrevus 2 years ago.    The 01/01/2018 labs showed normal IgG/igM/IgA and WBC.      MRI of the brain 01/09/2018 showed no changes compared to 02/10/2016 with a pattern of WM lesions c/w MS.    She notes a limp when tired or hot and the left leg occasionally gives out.    Last night, she has a burning sensation all over her body that has fluctuated since.  This has not happened in years.    She also noted a racing  heart beat and dull chest pain at times the last year.   HR is never above 110.   She is on carvedilol bid.     She has urinary urgency and rare urge incontinence.  Mood is doing well.   She denies anxiety.   She notes mild cognitive issues and occasionally states things wrong and she notes some difficulty remembering names.   Sleep is good most nights but she has nocturia.   Of note, she is on dyazide and prefers to take at night as she is always near a bathroom.        Update 01/01/2018: She is doing well and continues on Ocrelizumab (first infusion mid 2017 and last infusion 12/05/2017).  She tolerates Ocrevus well but not Benadryl (very sleepy and speech issues).   She denies exacerbation.   Gait is doing well with no falls.   She denies weakness.   She has bilateral foot numbness, left > right.   She stopped Flexeril as  spasms are better.  Bladder function is unchanged with frequency, urgency occasional incontinence, requiring wearing pads.  She prefers not to start another medication.    She has had cornea transplants but no MS related issues.   She sees ophthalmology regularly.     She notes more fatigue but notes more stress with her best friend in the hospital x 6 weeks and home repair issues.    She tries to sleep 7 hours.    She notes mild cognitive issues with word finding issues and decreased focus/attention and mild reduced STM.        From 10/11/2016: She was diagnosed with MS in 1991 after presenting with gait disturbance, weakness and vertigo. An MRI of the brain showed plaques and a lumbar puncture were reportedly consistent with MS. She saw Dr. Adolphus Birchwood a couple years later and was started her on Avonex.   She stayed on Avonex for about 10 years but then had new lesions on her MRI. She was referred to Dr. Jacqulynn Cadet. He placed her on Tysabri and she stayed on for a few years. 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 Unfortunately, on Gilenya, she had some MRI progression and Tysabri. Of note, her JCV antibody titers were initially less than 0.9. However, over the last couple years her titers have increased and her last JCV antibody was 1.03. Around June, she was switched to ocrelizumab. She had her first dose the end of June and her second dose in mid July. She has not noted any exacerbations while on ocrelizumab.  Gait/strength/sensation: Since her first exacerbation in 1991, she has had some difficulty with her balance and has had numbness in her feet she notes that her balance has worsened a little bit since then but has been fairly stable over the past few years. She stumbles but she has not had any recent falls. She notes that her legs sometimes feel a little bit weak. This used to be worse on the right but now is worse on the left.   Bladder/bowel: She has urinary urgency and frequency. She also has nocturia several times a night. She has had incontinence and wears pads. She denies constipation.  Vision: She denies any MS related visual problems and has not had optic neuritis or diplopia. 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  Fatigue/sleep: She does not report any major problems with fatigue. She does have some insomnia  at times but is able to sleep in late so this has not been troublesome. She wakes up several times a night to use the bathroom.   Mood/cognition: She denies any depression or anxiety. She has noted mild cognitive issues including problems coming up with the right words and decreased focus and attention  I personally reviewed the MRI images from 02/10/2016 I compare them to the MRI from 05/17/2014. She has classic T2/FLAIR hyperintense foci in the periventricular, juxtacortical and deep white matter of both hemispheres and also in the pons and right middle cerebellar peduncle in a pattern and configuration consistent with chronic demyelinating plaque associated with MS.   There were no acute findings and no change when compared to the previous MRI. The MRI of the cervical and thoracic spine showed foci in the upper cervical spine and in the mid to lower thoracic spine. None of these were acute.    I reviewed her labs. Her JCV antibody on 12/25/2015 was 1.03 (middle positive). She had normal CD4 and CD8 cells.   Hepatitis B labs and Quantiferon TB test were negative or nonreactive.   REVIEW OF SYSTEMS: Constitutional: No fevers, chills, sweats, or change in appetite.  Reports fatigue eyes: as abvove Ear, nose and throat: No hearing loss, ear pain, nasal congestion, sore throat Cardiovascular: No chest pain, palpitations Respiratory: No shortness of breath at rest or with exertion.   No wheezes GastrointestinaI: No nausea, vomiting, diarrhea, abdominal pain, fecal incontinence  Genitourinary: Notes frequency and nocturia with occasional urge incontinence Musculoskeletal: No neck pain, back pain Integumentary: No rash, pruritus, skin lesions Neurological: as above Psychiatric: No depression at this time.  She denies anxiety. Endocrine: No palpitations, diaphoresis, change in appetite, change in weigh or increased thirst Hematologic/Lymphatic: No anemia, purpura, petechiae. Allergic/Immunologic: No itchy/runny eyes, nasal congestion, recent allergic reactions, rashes  ALLERGIES: Allergies  Allergen Reactions  . Ace Inhibitors Swelling    lips  . Sulfa Antibiotics Hives and Itching  . Amoxicillin Diarrhea  . Benadryl [Diphenhydramine] Other (See Comments)    Difficulty swallowing  . Dilantin [Phenytoin Sodium Extended]   . Lisinopril   . Norvasc [Amlodipine Besylate]     HOME MEDICATIONS:  Current Outpatient Medications:  .  Biotin w/ Vitamins C & E (HAIR/SKIN/NAILS PO), Take by mouth., Disp: , Rfl:  .  carvedilol (COREG) 3.125 MG tablet, Take 3.125 mg by mouth 2 (two) times daily with a meal. Prn, Disp: , Rfl:  .  hydrochlorothiazide (MICROZIDE) 12.5 MG capsule, Take 12.5 mg by mouth daily as needed. , Disp: , Rfl:  .  levothyroxine (SYNTHROID, LEVOTHROID) 50 MCG tablet, Take 50 mcg by mouth daily before breakfast., Disp: , Rfl:  .  Multiple Vitamins-Minerals (MULTIVITAMIN ADULT PO), Take by mouth., Disp: , Rfl:  .  ocrelizumab 600 mg in sodium chloride 0.9 % 500 mL, Inject 600 mg into the vein every 6 (six) months., Disp: , Rfl:  .  prednisoLONE acetate (PRED MILD) 0.12 % ophthalmic suspension, 1 drop 4 (four) times daily., Disp: , Rfl:  .  timolol (BETIMOL) 0.5 % ophthalmic solution, Place 1 drop into the right eye daily., Disp: , Rfl:  .  triamterene-hydrochlorothiazide (DYAZIDE) 37.5-25 MG capsule, Take 1 capsule by mouth daily., Disp: , Rfl:  .  VITAMIN D PO, Take 5,000 Units by mouth daily., Disp: , Rfl:   PAST MEDICAL HISTORY: Past Medical  History:  Diagnosis Date  . Anemia   . Hypertension   . Multiple sclerosis ([place])   .  Neuromuscular disorder ([place])   . Vertigo   . Vision abnormalities     PAST SURGICAL HISTORY: Past Surgical History:  Procedure Laterality Date  . EYE SURGERY      FAMILY HISTORY: Family History  Problem Relation Age of Onset  . Hyperlipidemia Mother   . Hypertension Mother   . Stroke Father   . Diabetes Father     SOCIAL HISTORY:  Social History   Socioeconomic History  . Marital status: Married    Spouse name: Not on file  . Number of children: Not on file  . Years of education: Not on file  . Highest education level: Not on file  Occupational History  . Not on file  Social Needs  . Financial resource strain: Not on file  . Food insecurity    Worry: Not on file    Inability: Not on file  . Transportation needs    Medical: Not on file    Non-medical: Not on file  Tobacco Use  . Smoking status: Never Smoker  . Smokeless tobacco: Never Used  Substance and Sexual Activity  . Alcohol use: Not on file  . Drug use: Not on file  . Sexual activity: Not on file  Lifestyle  . Physical activity    Days per week: Not on file    Minutes per session: Not on file  . Stress: Not on file  Relationships  . Social Herbalist on phone: Not on file    Gets together: Not on file    Attends religious service: Not on file    Active member of club or organization: Not on file    Attends meetings of clubs or organizations: Not on file    Relationship status: Not on file  . Intimate partner violence    Fear of current or ex partner: Not on file    Emotionally abused: Not on file    Physically abused: Not on file    Forced sexual activity: Not on file  Other Topics Concern  . Not on file  Social History Narrative  . Not on file     PHYSICAL EXAM  Vitals:   07/08/19 1040  BP: 114/80  Pulse: 97  Temp: 97.8 F (36.6 C)  Weight: 157 lb 8 oz (71.4 kg)  Height: 5\' 3"  (1.6  m)    Body mass index is 27.9 kg/m.   General: The patient is well-developed and well-nourished and in no acute distress   Neurologic Exam  Mental status: The patient is alert and oriented x 3 at the time of the examination. The patient has apparent normal recent and remote memory, with an apparently normal attention span and concentration ability.   Speech is normal.  Cranial nerves: Extraocular movements are full.  Facial strength and sensation are normal.  Trapezius strength is good.  The tongue is midline, and the patient has symmetric elevation of the soft palate. No obvious hearing deficits are noted.  Motor:  Muscle bulk is normal.   Mildly increased tone in legs.. Strength is  5 / 5 in all 4 extremities.   Sensory: Intact touch and vibration in arms and legs  Coordination:   She has good FTN slightly reduced heel to shin bilaterally  Gait and station: Station is normal.   Gait is mildly wide and tandem gait is wide. Romberg is negative.   Reflexes: Deep tendon reflexes are symmetric and normal in both arms. DTRs are increased in legs with spread  at the knees.       Multiple sclerosis ([place]) - Plan: CBC with Differential/Platelet, Comprehensive metabolic panel, IgG, IgA, IgM  High risk medication use - Plan: CBC with Differential/Platelet, Comprehensive metabolic panel, IgG, IgA, IgM  Gait disturbance  Numbness  Vitamin D deficiency  Atypical facial pain    1.   Stay on [place] for RRMS but d/c benadryl (we can have her try Zyrtec).   We will check labs today.   Around time of next visit, I will want to have another brain MRI checked (last one in 2019 ws stable but she does have foci in her pons (one large one posterior right pons/middle cerebellar peduncle).   We will check sooner if facial pain worsens.   2.    Oxcarbazepine 300 mg bid and titrate up if tolerated for atypical facial pain (?trigeminal neuralgia) 3.    Stay active and exercise as tolerated. 4.     SHe will return to see me in 6 months or sooner if there are new or worsening neurologic symptoms. 3.   Stay active and exercise as tolerated.   She walks several days a week rtc 6 months or sooner if ne or worsening issues  Koen Antilla A. Felecia Shelling, MD, PhD, FAAN Certified in Neurology, Clinical Neurophysiology, Sleep Medicine, Pain Medicine and Neuroimaging Director, [place] [facility] [address] [place], [place] [address] [phone]

## 2019-07-09 ENCOUNTER — Telehealth: Payer: Self-pay | Admitting: *Deleted

## 2019-07-09 LAB — CBC WITH DIFFERENTIAL/PLATELET
Basophils Absolute: 0 10*3/uL (ref 0.0–0.2)
Basos: 1 %
EOS (ABSOLUTE): 0.2 10*3/uL (ref 0.0–0.4)
Eos: 4 %
Hematocrit: 35.5 % (ref 34.0–46.6)
Hemoglobin: 11.9 g/dL (ref 11.1–15.9)
Immature Grans (Abs): 0 10*3/uL (ref 0.0–0.1)
Immature Granulocytes: 0 %
Lymphocytes Absolute: 1.1 10*3/uL (ref 0.7–3.1)
Lymphs: 29 %
MCH: 26.9 pg (ref 26.6–33.0)
MCHC: 33.5 g/dL (ref 31.5–35.7)
MCV: 80 fL (ref 79–97)
Monocytes Absolute: 0.3 10*3/uL (ref 0.1–0.9)
Monocytes: 7 %
Neutrophils Absolute: 2.1 10*3/uL (ref 1.4–7.0)
Neutrophils: 59 %
Platelets: 294 10*3/uL (ref 150–450)
RBC: 4.43 x10E6/uL (ref 3.77–5.28)
RDW: 15.2 % (ref 11.7–15.4)
WBC: 3.7 10*3/uL (ref 3.4–10.8)

## 2019-07-09 LAB — COMPREHENSIVE METABOLIC PANEL
ALT: 13 IU/L (ref 0–32)
AST: 14 IU/L (ref 0–40)
Albumin/Globulin Ratio: 1.9 (ref 1.2–2.2)
Albumin: 4 g/dL (ref 3.8–4.9)
Alkaline Phosphatase: 149 IU/L — ABNORMAL HIGH (ref 39–117)
BUN/Creatinine Ratio: 17 (ref 9–23)
BUN: 18 mg/dL (ref 6–24)
Bilirubin Total: 0.3 mg/dL (ref 0.0–1.2)
CO2: 25 mmol/L (ref 20–29)
Calcium: 9.7 mg/dL (ref 8.7–10.2)
Chloride: 104 mmol/L (ref 96–106)
Creatinine, Ser: 1.09 mg/dL — ABNORMAL HIGH (ref 0.57–1.00)
GFR calc Af Amer: 67 mL/min/{1.73_m2} (ref >59)
GFR calc non Af Amer: 58 mL/min/{1.73_m2} — ABNORMAL LOW (ref >59)
Globulin, Total: 2.1 g/dL (ref 1.5–4.5)
Glucose: 91 mg/dL (ref 65–99)
Potassium: 4.5 mmol/L (ref 3.5–5.2)
Sodium: 142 mmol/L (ref 134–144)
Total Protein: 6.1 g/dL (ref 6.0–8.5)

## 2019-07-09 LAB — IGG, IGA, IGM
IgA/Immunoglobulin A, Serum: 73 mg/dL — ABNORMAL LOW (ref 87–352)
IgG (Immunoglobin G), Serum: 718 mg/dL (ref 586–1602)
IgM (Immunoglobulin M), Srm: 72 mg/dL (ref 26–217)

## 2019-07-09 NOTE — Telephone Encounter (Signed)
-----   Message from Britt Bottom, MD sent at 07/09/2019  9:54 AM EDT ----- Please let the patient know that the lab work is fine.

## 2019-07-09 NOTE — Telephone Encounter (Signed)
Called, LVM for pt that labs fine per Dr. Felecia Shelling. Gave [facility] phone number if she has further questions/concerns.

## 2019-07-17 ENCOUNTER — Telehealth: Payer: Self-pay | Admitting: Neurology

## 2019-07-17 NOTE — Telephone Encounter (Signed)
Pt called stating that her pharmacist informed her that no RX was sent in for her and she states that the provider was to send in medication for her nerve pain. Please advise.

## 2019-07-20 MED ORDER — OXCARBAZEPINE 300 MG PO TABS: 300.0000 mg | ORAL_TABLET | Freq: Two times a day (BID) | ORAL | 5 refills | Status: DC

## 2019-07-20 NOTE — Telephone Encounter (Signed)
Called, pt. Informed her MD called in oxcarbazepine 300mg  po BID for her. She verbalized understanding.

## 2019-07-20 NOTE — Telephone Encounter (Signed)
Please let her know that I will send in a prescription for oxcarbazepine 300 mg p.o. twice daily.  We may increase this further based on her response.

## 2019-07-28 ENCOUNTER — Other Ambulatory Visit: Payer: Self-pay | Admitting: Obstetrics and Gynecology

## 2019-09-15 ENCOUNTER — Telehealth: Payer: Self-pay | Admitting: *Deleted

## 2019-09-15 NOTE — Telephone Encounter (Signed)
Faxed updated Ocrevus prescriber form to [employer] at [phone]. Received fax confirmation.  Received notice that current one on file expiring 10/12/19.

## 2019-10-08 ENCOUNTER — Encounter: Payer: Self-pay | Admitting: Neurology

## 2019-10-15 ENCOUNTER — Telehealth: Payer: Self-pay | Admitting: Neurology

## 2019-10-15 NOTE — Telephone Encounter (Signed)
This is probably unrelated to the MS (constipation is more likely to be a problem from [place]) if her symptoms persist she may need to see GI for further evaluation.

## 2019-10-15 NOTE — Telephone Encounter (Signed)
I called pt back and relayed Dr. Garth Bigness message. She verbalized understanding. She will try to get in to see a GI specialist to be further evaluated.

## 2019-10-15 NOTE — Telephone Encounter (Signed)
Patient called in and stated she has been experiencing diarrhea since December 13 , she has been speaking with her PCP about the issue she has tried the [employer] with no improvement , she has had the most at 7 episodes a day. She wants to know if this is a symptom of her MS  CB# [phone]

## 2019-10-15 NOTE — Telephone Encounter (Signed)
Dr. Felecia Shelling- what do you recommend?

## 2019-12-15 ENCOUNTER — Telehealth: Payer: Self-pay | Admitting: Neurology

## 2019-12-15 NOTE — Telephone Encounter (Signed)
Called patient back. Last Ocrevus infusion about 4 weeks ago. Advised per Dr. Felecia Shelling she can get any of the three covid-19 vaccines available. She is a parent Psychologist, occupational at school. Thinks she will get vaccine this Thursday/Friday. She will call back if anything changes.

## 2019-12-15 NOTE — Telephone Encounter (Signed)
Pt called wanting to ask RN what is the time restriction between a infusion and the Covid Vaccine. Please advise.

## 2020-01-05 ENCOUNTER — Other Ambulatory Visit: Payer: Self-pay

## 2020-01-05 ENCOUNTER — Ambulatory Visit: Payer: 59 | Admitting: Neurology

## 2020-01-05 ENCOUNTER — Encounter: Payer: Self-pay | Admitting: Neurology

## 2020-01-05 VITALS — BP 119/79 | HR 87 | Temp 97.9°F | Ht 63.0 in | Wt 149.5 lb

## 2020-01-05 DIAGNOSIS — Z79899 Other long term (current) drug therapy: Secondary | ICD-10-CM | POA: Diagnosis not present

## 2020-01-05 DIAGNOSIS — N3941 Urge incontinence: Secondary | ICD-10-CM

## 2020-01-05 DIAGNOSIS — E559 Vitamin D deficiency, unspecified: Secondary | ICD-10-CM

## 2020-01-05 DIAGNOSIS — G35 Multiple sclerosis: Secondary | ICD-10-CM

## 2020-01-05 DIAGNOSIS — R269 Unspecified abnormalities of gait and mobility: Secondary | ICD-10-CM

## 2020-01-05 DIAGNOSIS — M25562 Pain in left knee: Secondary | ICD-10-CM | POA: Insufficient documentation

## 2020-01-05 NOTE — Progress Notes (Signed)
[facility]  PATIENT: Pamela Potter DOB: 1966/09/26  REFERRING DOCTOR OR PCP:  Dr. Starleen Blue    Dr. Maude Leriche is PCP Pamela Potter [facility] SOURCE: patient, records from Dr. Starleen Blue. Laboratory results, MRI results, multiple MRI images on PACS  _________________________________   HISTORICAL  CHIEF COMPLAINT:  Chief Complaint  Patient presents with  . Follow-up    RM 12, alone. Last seen 07/08/2019. She had gotten her covid-19 vaccine (both shots)  . Multiple Sclerosis    On Ocrevus, Last infusion 11/19/2019. next one due around 06/02/2020 at 8am. Verified she had correct appt info in her phone.     HISTORY OF PRESENT ILLNESS:  Pamela Potter is a 54 y.o. woman with relapsing remitting multiple sclerosis.  Update 01/05/2020: She feels her MS is doing ok.   No exacerbation or new MS symptoms.   She tolerates Ocrevus well but did not tolerate Benadryl IV.  She is noting more small moles on her face and chest.    She had diarrhea x 6 weeks.   Imodium helped a while at first and then switched to a probiotic.    She is walking fairly well.  She can go about a half mile without break but may stumble more after she walks longer distance.   She does worse in heat.   She feels her right leg does worse than her left leg.   She does note some left knee pain --- no x-rays.   She has some incontinence and wears pads.    Vision has done well.     She notes fatigue is mild unless she is more active.     She sleeps poorly some nights but other nights she does well.   She denies depression.  She notes mild verbal fluency issues.      Update 07/08/2019: She has been stable on Ocrevus and tolerates the infusions well.  However, she got very sleepy with slurred speech and a sensation of her throat closing with the Benadryl.    She has no definite exacerbations but does note pain in the right face that started last month.   Dentists have not found a cause.   Steroids helped temporarily.    She gets sharp pain, worse if she lays on her right side.   Pain lasts a few seconds to a minute.  She had a few spells yesterday, none yet today.        She is taking vit D  Update 01/06/2019 (virtual):  She feels her MS has been stable.  There have not been any exacerbations or new symptoms.  Her last Ocrevus infusion was in February.  She has now been on Ocrevus for over 2 years.  Last year we did check the IgG and IgM and they were normal.  Her creatinine was a little bit elevated and she has had follow-up lab work with her primary care and was told that the kidneys look good.  The MRI of the brain 01/09/2018 was stable compared to the 02/10/2016 MRI.   At the last visit, vitamin D was slightly low and she takes supplements.  Her gait is doing about the same.  She has a slight limp at times especially when she is tired or hot.  The left leg is slightly worse than the right leg.  She does have some burning sensations in the body but these are not troubling and do not need to be treated.  She has some urinary urgency and  rarely has had urge incontinence but this is stable.  Vision is fine.  Mood is doing well.  She denies any depression or anxiety though does feel a little bit stressed with her son and husband having to continue to work during the [place] and 19 pandemic.  She feels cognitive functions are stable.  Occasionally she has some word finding difficulties.  She is sleeping 7 to 8 hours a day but typically will split this into two 3 to 4-hour blocks with 2 to 3 hours of being awake in the middle of the night.  This does not bother her.  Update 07/07/2018: She feels her MS has generally done well since starting Ocrevus 2 years ago.    The 01/01/2018 labs showed normal IgG/igM/IgA and WBC.      MRI of the brain 01/09/2018 showed no changes compared to 02/10/2016 with a pattern of WM lesions c/w MS.    She notes a limp when tired or hot and the left leg occasionally gives out.    Last night, she has a burning  sensation all over her body that has fluctuated since.  This has not happened in years.    She also noted a racing heart beat and dull chest pain at times the last year.   HR is never above 110.   She is on carvedilol bid.     She has urinary urgency and rare urge incontinence.  Mood is doing well.   She denies anxiety.   She notes mild cognitive issues and occasionally states things wrong and she notes some difficulty remembering names.   Sleep is good most nights but she has nocturia.   Of note, she is on dyazide and prefers to take at night as she is always near a bathroom.        Update 01/01/2018: She is doing well and continues on Ocrelizumab (first infusion mid 2017 and last infusion 12/05/2017).  She tolerates Ocrevus well but not Benadryl (very sleepy and speech issues).   She denies exacerbation.   Gait is doing well with no falls.   She denies weakness.   She has bilateral foot numbness, left > right.   She stopped Flexeril as spasms are better.  Bladder function is unchanged with frequency, urgency occasional incontinence, requiring wearing pads.  She prefers not to start another medication.    She has had cornea transplants but no MS related issues.   She sees ophthalmology regularly.     She notes more fatigue but notes more stress with her best friend in the hospital x 6 weeks and home repair issues.    She tries to sleep 7 hours.    She notes mild cognitive issues with word finding issues and decreased focus/attention and mild reduced STM.        From 10/11/2016: She was diagnosed with MS in 1991 after presenting with gait disturbance, weakness and vertigo. An MRI of the brain showed plaques and a lumbar puncture were reportedly consistent with MS. She saw Dr. Adolphus Birchwood a couple years later and was started her on Avonex.   She stayed on Avonex for about 10 years but then had new lesions on her MRI. She was referred to Dr. Jacqulynn Cadet. He placed her on Tysabri and she stayed on for a few  years. 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 Unfortunately, on Gilenya, she had some MRI progression and Tysabri. Of note, her  JCV antibody titers were initially less than 0.9. However, over the last couple years her titers have increased and her last JCV antibody was 1.03. Around June, she was switched to ocrelizumab. She had her first dose the end of June and her second dose in mid July. She has not noted any exacerbations while on ocrelizumab.  Gait/strength/sensation: Since her first exacerbation in 1991, she has had some difficulty with her balance and has had numbness in her feet she notes that her balance has worsened a little bit since then but has been fairly stable over the past few years. She stumbles but she has not had any recent falls. She notes that her legs sometimes feel a little bit weak. This used to be worse on the right but now is worse on the left.  Bladder/bowel: She has urinary urgency and frequency. She also has nocturia several times a night. She has had incontinence and wears pads. She denies constipation.  Vision: She denies any MS related visual problems and has not had optic neuritis or diplopia. 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  Fatigue/sleep: She does not report any major problems with fatigue. She does have some insomnia at times but is able to sleep in late so this has not been troublesome. She wakes up several times a night to use the bathroom.   Mood/cognition: She denies any depression or anxiety. She has noted mild cognitive issues including problems coming up with the right words and decreased focus and attention  I personally reviewed the MRI images from 02/10/2016 I compare them to the MRI from 05/17/2014. She has classic T2/FLAIR hyperintense foci in the periventricular, juxtacortical and deep white matter of both hemispheres and also in the pons and right middle  cerebellar peduncle in a pattern and configuration consistent with chronic demyelinating plaque associated with MS.   There were no acute findings and no change when compared to the previous MRI. The MRI of the cervical and thoracic spine showed foci in the upper cervical spine and in the mid to lower thoracic spine. None of these were acute.    I reviewed her labs. Her JCV antibody on 12/25/2015 was 1.03 (middle positive). She had normal CD4 and CD8 cells.   Hepatitis B labs and Quantiferon TB test were negative or nonreactive.   REVIEW OF SYSTEMS: Constitutional: No fevers, chills, sweats, or change in appetite.  Reports fatigue eyes: as abvove Ear, nose and throat: No hearing loss, ear pain, nasal congestion, sore throat Cardiovascular: No chest pain, palpitations Respiratory: No shortness of breath at rest or with exertion.   No wheezes GastrointestinaI: No nausea, vomiting, diarrhea, abdominal pain, fecal incontinence Genitourinary: Notes frequency and nocturia with occasional urge incontinence Musculoskeletal: No neck pain, back pain Integumentary: No rash, pruritus, skin lesions Neurological: as above Psychiatric: No depression at this time.  She denies anxiety. Endocrine: No palpitations, diaphoresis, change in appetite, change in weigh or increased thirst Hematologic/Lymphatic: No anemia, purpura, petechiae. Allergic/Immunologic: No itchy/runny eyes, nasal congestion, recent allergic reactions, rashes  ALLERGIES: Allergies  Allergen Reactions  . Ace Inhibitors Swelling    lips  . Sulfa Antibiotics Hives and Itching  . Amoxicillin Diarrhea  . Benadryl [Diphenhydramine] Other (See Comments)    Difficulty swallowing  . Dilantin [Phenytoin Sodium Extended]   . Lisinopril   . Norvasc [Amlodipine Besylate]     HOME MEDICATIONS:  Current Outpatient Medications:  .  Biotin w/ Vitamins C & E (HAIR/SKIN/NAILS PO), Take by mouth.,  Disp: , Rfl:  .  carvedilol (COREG) 3.125 MG  tablet, Take 3.125 mg by mouth 2 (two) times daily with a meal. Prn, Disp: , Rfl:  .  hydrochlorothiazide (MICROZIDE) 12.5 MG capsule, Take 12.5 mg by mouth daily as needed. , Disp: , Rfl:  .  levothyroxine (SYNTHROID, LEVOTHROID) 50 MCG tablet, Take 50 mcg by mouth daily before breakfast., Disp: , Rfl:  .  Multiple Vitamins-Minerals (MULTIVITAMIN ADULT PO), Take by mouth., Disp: , Rfl:  .  ocrelizumab 600 mg in sodium chloride 0.9 % 500 mL, Inject 600 mg into the vein every 6 (six) months., Disp: , Rfl:  .  Oxcarbazepine (TRILEPTAL) 300 MG tablet, Take 1 tablet (300 mg total) by mouth 2 (two) times daily., Disp: 60 tablet, Rfl: 5 .  prednisoLONE acetate (PRED MILD) 0.12 % ophthalmic suspension, 1 drop 4 (four) times daily., Disp: , Rfl:  .  timolol (BETIMOL) 0.5 % ophthalmic solution, Place 1 drop into the right eye daily., Disp: , Rfl:  .  triamterene-hydrochlorothiazide (DYAZIDE) 37.5-25 MG capsule, Take 1 capsule by mouth daily., Disp: , Rfl:  .  VITAMIN D PO, Take 5,000 Units by mouth daily., Disp: , Rfl:   PAST MEDICAL HISTORY: Past Medical History:  Diagnosis Date  . Anemia   . Hypertension   . Multiple sclerosis ([place])   . Neuromuscular disorder ([place])   . Vertigo   . Vision abnormalities     PAST SURGICAL HISTORY: Past Surgical History:  Procedure Laterality Date  . EYE SURGERY      FAMILY HISTORY: Family History  Problem Relation Age of Onset  . Hyperlipidemia Mother   . Hypertension Mother   . Stroke Father   . Diabetes Father     SOCIAL HISTORY:  Social History   Socioeconomic History  . Marital status: Married    Spouse name: Not on file  . Number of children: Not on file  . Years of education: Not on file  . Highest education level: Not on file  Occupational History  . Not on file  Tobacco Use  . Smoking status: Never Smoker  . Smokeless tobacco: Never Used  Substance and Sexual Activity  . Alcohol use: Not on file  . Drug use: Not on file  . Sexual  activity: Not on file  Other Topics Concern  . Not on file  Social History Narrative  . Not on file   Social Determinants of Health   Financial Resource Strain:   . Difficulty of Paying Living Expenses:   Food Insecurity:   . Worried About Charity fundraiser in the Last Year:   . Arboriculturist in the Last Year:   Transportation Needs:   . Film/video editor (Medical):   Marland Kitchen Lack of Transportation (Non-Medical):   Physical Activity:   . Days of Exercise per Week:   . Minutes of Exercise per Session:   Stress:   . Feeling of Stress :   Social Connections:   . Frequency of Communication with Friends and Family:   . Frequency of Social Gatherings with Friends and Family:   . Attends Religious Services:   . Active Member of Clubs or Organizations:   . Attends Archivist Meetings:   Marland Kitchen Marital Status:   Intimate Partner Violence:   . Fear of Current or Ex-Partner:   . Emotionally Abused:   Marland Kitchen Physically Abused:   . Sexually Abused:      PHYSICAL EXAM  Vitals:  01/05/20 1255  BP: 119/79  Pulse: 87  Temp: 97.9 F (36.6 C)  Weight: 149 lb 8 oz (67.8 kg)  Height: 5\' 3"  (1.6 m)    Body mass index is 26.48 kg/m.   General: The patient is well-developed and well-nourished and in no acute distress   Neurologic Exam  Mental status: The patient is alert and oriented x 3 at the time of the examination. The patient has apparent normal recent and remote memory, with an apparently normal attention span and concentration ability.   Speech is normal.  Cranial nerves: Extraocular movements are full.  Facial strength and sensation are normal.  Trapezius strength is good.   No obvious hearing deficits are noted.  Motor:  Muscle bulk is normal but tone is mildly increased in legs.. Strength is  5 / 5 in all 4 extremities.   Sensory: Intact touch and vibration in arms and legs  Coordination:   She has good FTN but slightly  reduced heel to shin bilaterally  Gait  and station: Station is normal.   Gait is mildly wide and tandem gait is wide. Romberg is negative.   Reflexes: Deep tendon reflexes are symmetric and normal in both arms. DTRs are increased in legs with spread at the knees, left > right     Multiple sclerosis (HCC)  Vitamin D deficiency - Plan: VITAMIN D 25 Hydroxy (Vit-D Deficiency, Fractures), IgG, IgA, IgM, CBC with Differential/Platelet  High risk medication use - Plan: IgG, IgA, IgM, CBC with Differential/Platelet  Gait disturbance  Urge incontinence of urine  Left knee pain, unspecified chronicity   1.   Continue Ocrevus RRMS.  Due to a reaction, she will have Zyrtec pills instead of Benadryl IV.   Check brain and cervical spine MRI checked to determine if subclinical activity --- consider a different DMT if present.   2.    Stay active and exercise as tolerated. 3.    Check left knee x-ray.     4.    Ok to restart Vit D at lower dose - stop if diarrhea returns.   5.   SHe will return to see [place] in 6 months or sooner if there are new or worsening neurologic symptoms. 3.   Stay active and exercise as tolerated.   She walks several days a week rtc 6 months or sooner if ne or worsening issues  Riann Oman A. Felecia Shelling, MD, PhD, FAAN Certified in Neurology, Clinical Neurophysiology, Sleep Medicine, Pain Medicine and Neuroimaging Director, [facility] [address] [place], [place] [address] [phone]

## 2020-01-06 ENCOUNTER — Telehealth: Payer: Self-pay | Admitting: Neurology

## 2020-01-06 LAB — CBC WITH DIFFERENTIAL/PLATELET
Basophils Absolute: 0 10*3/uL (ref 0.0–0.2)
Basos: 1 %
EOS (ABSOLUTE): 0.1 10*3/uL (ref 0.0–0.4)
Eos: 3 %
Hematocrit: 39.8 % (ref 34.0–46.6)
Hemoglobin: 13 g/dL (ref 11.1–15.9)
Immature Grans (Abs): 0 10*3/uL (ref 0.0–0.1)
Immature Granulocytes: 0 %
Lymphocytes Absolute: 1.1 10*3/uL (ref 0.7–3.1)
Lymphs: 31 %
MCH: 27.6 pg (ref 26.6–33.0)
MCHC: 32.7 g/dL (ref 31.5–35.7)
MCV: 85 fL (ref 79–97)
Monocytes Absolute: 0.3 10*3/uL (ref 0.1–0.9)
Monocytes: 8 %
Neutrophils Absolute: 2.1 10*3/uL (ref 1.4–7.0)
Neutrophils: 57 %
Platelets: 342 10*3/uL (ref 150–450)
RBC: 4.71 x10E6/uL (ref 3.77–5.28)
RDW: 14.5 % (ref 11.7–15.4)
WBC: 3.6 10*3/uL (ref 3.4–10.8)

## 2020-01-06 LAB — IGG, IGA, IGM
IgA/Immunoglobulin A, Serum: 74 mg/dL — ABNORMAL LOW (ref 87–352)
IgG (Immunoglobin G), Serum: 799 mg/dL (ref 586–1602)
IgM (Immunoglobulin M), Srm: 73 mg/dL (ref 26–217)

## 2020-01-06 LAB — VITAMIN D 25 HYDROXY (VIT D DEFICIENCY, FRACTURES): Vit D, 25-Hydroxy: 43 ng/mL (ref 30.0–100.0)

## 2020-01-06 NOTE — Telephone Encounter (Signed)
[place]: [phone] & [phone] (exp. 01/06/20 to 02/20/20) order sent to GI. They will reach out to the patient to schedule.

## 2020-01-11 ENCOUNTER — Other Ambulatory Visit: Payer: Self-pay | Admitting: Neurology

## 2020-02-12 ENCOUNTER — Ambulatory Visit
Admission: RE | Admit: 2020-02-12 | Discharge: 2020-02-12 | Disposition: A | Discharge status: Home / Self Care | Payer: 59 | Source: Ambulatory Visit | Attending: Neurology | Admitting: Neurology

## 2020-02-12 ENCOUNTER — Other Ambulatory Visit: Payer: Self-pay

## 2020-02-12 DIAGNOSIS — G35 Multiple sclerosis: Secondary | ICD-10-CM

## 2020-03-15 ENCOUNTER — Telehealth: Payer: Self-pay | Admitting: Neurology

## 2020-03-15 NOTE — Telephone Encounter (Signed)
Pamela Potter called 03/15/2010 and relayed she needed to speak to some one in billing about a claim on this patient .  Telephone [phone].  She was calling from [facility].

## 2020-05-25 ENCOUNTER — Telehealth: Payer: Self-pay | Admitting: Neurology

## 2020-05-25 NOTE — Telephone Encounter (Addendum)
Spoke with Dr. Felecia Shelling, he recommends she get the third vaccination dose now and r/s her Ocrevus for 2-3 weeks later. I called pt and relayed this. She is going to try and get vaccination. She would like to cx Ocrevus infusion for 06/02/20 and will call back once she has had vaccine to get her Ocrevus r/s with infusion suite. I informed Liane of this.  She is still having issues with anxiety/loose bowels when going to the doctor. She is worried about court appt she has in a month. She is going to contact GI MD for recommendation.

## 2020-05-25 NOTE — Telephone Encounter (Signed)
Patient called wanting advice on getting the 3rd Covid vaccine. Please advise. FYI

## 2020-05-25 NOTE — Telephone Encounter (Signed)
Spoke with Liane, RN in the infusion suite. Pt scheduled 06/02/20 for next Ocrevus infusion.

## 2020-07-11 ENCOUNTER — Other Ambulatory Visit: Payer: Self-pay

## 2020-07-11 ENCOUNTER — Encounter: Payer: Self-pay | Admitting: Neurology

## 2020-07-11 ENCOUNTER — Ambulatory Visit (INDEPENDENT_AMBULATORY_CARE_PROVIDER_SITE_OTHER): Payer: 59 | Admitting: Neurology

## 2020-07-11 ENCOUNTER — Telehealth: Payer: Self-pay | Admitting: *Deleted

## 2020-07-11 VITALS — BP 120/84 | HR 89 | Ht 63.0 in | Wt 152.5 lb

## 2020-07-11 DIAGNOSIS — R269 Unspecified abnormalities of gait and mobility: Secondary | ICD-10-CM | POA: Diagnosis not present

## 2020-07-11 DIAGNOSIS — Z79899 Other long term (current) drug therapy: Secondary | ICD-10-CM | POA: Diagnosis not present

## 2020-07-11 DIAGNOSIS — M25562 Pain in left knee: Secondary | ICD-10-CM | POA: Diagnosis not present

## 2020-07-11 DIAGNOSIS — G35 Multiple sclerosis: Secondary | ICD-10-CM | POA: Diagnosis not present

## 2020-07-11 DIAGNOSIS — Z5181 Encounter for therapeutic drug level monitoring: Secondary | ICD-10-CM

## 2020-07-11 DIAGNOSIS — E559 Vitamin D deficiency, unspecified: Secondary | ICD-10-CM

## 2020-07-11 DIAGNOSIS — N3941 Urge incontinence: Secondary | ICD-10-CM

## 2020-07-11 DIAGNOSIS — R5383 Other fatigue: Secondary | ICD-10-CM

## 2020-07-11 NOTE — Telephone Encounter (Signed)
Gave completed/signed disability forms back to medical records to process for pt.

## 2020-07-11 NOTE — Progress Notes (Signed)
[facility]  PATIENT: Pamela Potter DOB: 1966-06-12  REFERRING DOCTOR OR PCP:  Dr. Starleen Blue    Dr. Maude Leriche is PCP Pamela Potter [facility] SOURCE: patient, records from Dr. Starleen Blue. Laboratory results, MRI results, multiple MRI images on PACS  _________________________________   HISTORICAL  CHIEF COMPLAINT:  Chief Complaint  Patient presents with  . Follow-up    RM 12, alone. Last seen 01/05/20. Has forms needing to be filled out.  . Multiple Sclerosis    On Ocrevus. Last: 07/06/20. Next: 01/18/21.     HISTORY OF PRESENT ILLNESS:  [name] is a 54 y.o. woman with relapsing remitting multiple sclerosis.  Update 07/11/2020: She is on Ocrevus.  She feels her MS is doing ok and has no recent exacerbation or new MS symptoms.   She tolerates Ocrevus well.     She is walking fairly well.  She can go about 1/2 to 40f mile without break but may stumble more after she walks longer distance.   She does worse in heat. She needs to hold the bannister on stairs.    She feels her right leg does worse than her left leg.   She does note some left knee pain..   She has urinary urgency and occasional incontinence and wears pads.   She gets frequent diarrhea, especially if nervous.  Imodium or kaopectate can help.   Vision has done well.     She notes fatigue, worse if hot.     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  She is often hot at night and has trouble getting comfortable.  She twists a lot at night even while asleep.     She denies depression.  She notes verbal fluency issues and is forgetful.    She stopped vitamin D supplements    MS History: She was diagnosed with MS in 1991 after presenting with gait disturbance, weakness and vertigo. An MRI of the brain showed plaques and a lumbar puncture were reportedly consistent with MS. She saw Dr. Adolphus Birchwood a couple years later and was started her on Avonex.   She stayed on Avonex for about 10 years but  then had new lesions on her MRI. She was referred to Dr. Jacqulynn Cadet. He placed her on Tysabri and she stayed on for a few years. 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 Unfortunately, on Gilenya, she had some MRI progression and Tysabri was restarted. . Of note, her JCV antibody titers were initially less than 0.9. However, over the last couple years her titers have increased and her last JCV antibody was 1.03. She was switched to ocrelizumab and had her first dose the end of June 2017 and her second dose in mid July. She has not noted any exacerbations while on ocrelizumab.   DATA: MRI images from 02/10/2016 show classic T2/FLAIR hyperintense foci in the periventricular, juxtacortical and deep white matter of both hemispheres and also in the pons and right middle cerebellar peduncle in a pattern and configuration consistent with chronic demyelinating plaque associated with MS.   There were no acute findings and no change when compared to the previous MRI, from 05/17/2014  The MRI of the cervical and thoracic spine 2017 showed foci in the upper cervical spine and in the mid to lower thoracic spine. None of these were acute.      I reviewed her labs. Her JCV antibody on 12/25/2015 was  1.03 (middle positive). She had normal CD4 and CD8 cells.   Hepatitis B labs and Quantiferon TB test were negative or nonreactive.  MRI 02/12/2020 cervical spine:  Patchy T2 hyperintense foci within the upper cervical spine as described above.  None of the foci appear to be acute and there are no definite new lesions compared to the previous MRI from 2015.   Small left paramedian disc protrusion at C6-C7, not present on the 2015 MRI, that does not lead to nerve root compression or spinal stenosis  MRI 02/12/2020 Brain:  Multiple T2/FLAIR hyperintense foci in the brainstem, middle cerebellar peduncles and hemispheres in a pattern and configuration consistent with chronic  demyelinating plaque associated with multiple sclerosis.  None of the foci appear to be acute.  Compared to the MRI from 01/08/2018, there are no new lesions.    REVIEW OF SYSTEMS: Constitutional: No fevers, chills, sweats, or change in appetite.  Reports fatigue eyes: as abvove Ear, nose and throat: No hearing loss, ear pain, nasal congestion, sore throat Cardiovascular: No chest pain, palpitations Respiratory: No shortness of breath at rest or with exertion.   No wheezes GastrointestinaI: No nausea, vomiting, diarrhea, abdominal pain, fecal incontinence Genitourinary: Notes frequency and nocturia with occasional urge incontinence Musculoskeletal: No neck pain, back pain Integumentary: No rash, pruritus, skin lesions Neurological: as above Psychiatric: No depression at this time.  She denies anxiety. Endocrine: No palpitations, diaphoresis, change in appetite, change in weigh or increased thirst Hematologic/Lymphatic: No anemia, purpura, petechiae. Allergic/Immunologic: No itchy/runny eyes, nasal congestion, recent allergic reactions, rashes  ALLERGIES: Allergies  Allergen Reactions  . Ace Inhibitors Swelling    lips  . Sulfa Antibiotics Hives and Itching  . Amoxicillin Diarrhea  . Benadryl [Diphenhydramine] Other (See Comments)    Difficulty swallowing  . Dilantin [Phenytoin Sodium Extended]   . Lisinopril   . Norvasc [Amlodipine Besylate]     HOME MEDICATIONS:  Current Outpatient Medications:  .  carvedilol (COREG) 3.125 MG tablet, Take 3.125 mg by mouth 2 (two) times daily with a meal. Prn, Disp: , Rfl:  .  levothyroxine (SYNTHROID, LEVOTHROID) 50 MCG tablet, Take 50 mcg by mouth daily before breakfast., Disp: , Rfl:  .  ocrelizumab 600 mg in sodium chloride 0.9 % 500 mL, Inject 600 mg into the vein every 6 (six) months., Disp: , Rfl:  .  prednisoLONE acetate (PRED MILD) 0.12 % ophthalmic suspension, 1 drop 4 (four) times daily., Disp: , Rfl:  .  timolol (BETIMOL) 0.5 %  ophthalmic solution, Place 1 drop into the right eye daily., Disp: , Rfl:  .  triamterene-hydrochlorothiazide (DYAZIDE) 37.5-25 MG capsule, Take 1 capsule by mouth daily., Disp: , Rfl:   PAST MEDICAL HISTORY: Past Medical History:  Diagnosis Date  . Anemia   . Hypertension   . Multiple sclerosis ([place])   . Neuromuscular disorder ([place])   . Vertigo   . Vision abnormalities     PAST SURGICAL HISTORY: Past Surgical History:  Procedure Laterality Date  . EYE SURGERY      FAMILY HISTORY: Family History  Problem Relation Age of Onset  . Hyperlipidemia Mother   . Hypertension Mother   . Stroke Father   . Diabetes Father     SOCIAL HISTORY:  Social History   Socioeconomic History  . Marital status: Married    Spouse name: Not on file  . Number of children: Not on file  . Years of education: Not on file  . Highest education level: Not on file  Occupational History  . Not on file  Tobacco Use  . Smoking status: Never Smoker  . Smokeless tobacco: Never Used  Substance and Sexual Activity  . Alcohol use: Not on file  . Drug use: Not on file  . Sexual activity: Not on file  Other Topics Concern  . Not on file  Social History Narrative  . Not on file   Social Determinants of Health   Financial Resource Strain:   . Difficulty of Paying Living Expenses: Not on file  Food Insecurity:   . Worried About Charity fundraiser in the Last Year: Not on file  . Ran Out of Food in the Last Year: Not on file  Transportation Needs:   . Lack of Transportation (Medical): Not on file  . Lack of Transportation (Non-Medical): Not on file  Physical Activity:   . Days of Exercise per Week: Not on file  . Minutes of Exercise per Session: Not on file  Stress:   . Feeling of Stress : Not on file  Social Connections:   . Frequency of Communication with Friends and Family: Not on file  . Frequency of Social Gatherings with Friends and Family: Not on file  . Attends Religious Services:  Not on file  . Active Member of Clubs or Organizations: Not on file  . Attends Archivist Meetings: Not on file  . Marital Status: Not on file  Intimate Partner Violence:   . Fear of Current or Ex-Partner: Not on file  . Emotionally Abused: Not on file  . Physically Abused: Not on file  . Sexually Abused: Not on file     PHYSICAL EXAM  Vitals:   07/11/20 1253  BP: 120/84  Pulse: 89  Weight: 152 lb 8 oz (69.2 kg)  Height: 5\' 3"  (1.6 m)    Body mass index is 27.01 kg/m.   General: The patient is well-developed and well-nourished and in no acute distress   Neurologic Exam  Mental status: The patient is alert and oriented x 3 at the time of the examination. The patient has apparent normal recent and remote memory, with an apparently normal attention span and concentration ability.   Speech is normal.  Cranial nerves: Extraocular movements are full.  Facial strength and sensation are normal.  Trapezius strength is good.   No obvious hearing deficits are noted.  Motor:  Muscle bulk is normal but tone is mildly increased in legs.. Strength is  5 / 5 in all 4 extremities except 4 distal right foot.   Sensory: Intact touch and vibration in arms and legs  Coordination:   She has good FTN but reduced heel to shin bilaterally  Gait and station: Station is normal.   Gait is wide and tandem gait is very wide. Mild right foot drop.  Romberg is negative.   Reflexes: Deep tendon reflexes are symmetric and normal in both arms. DTRs are increased in legs with spread at the knees.        Multiple sclerosis ([place]) - Plan: IgG, IgA, IgM, CBC with Differential/Platelet, Comprehensive metabolic panel, TSH, Hemoglobin A1c  Left knee pain, unspecified chronicity - Plan: DG Knee 3 Views Left  Gait disturbance  High risk medication use - Plan: IgG, IgA, IgM, CBC with Differential/Platelet, Comprehensive metabolic panel, TSH, Hemoglobin A1c  Vitamin D deficiency - Plan: VITAMIN D  25 Hydroxy (Vit-D Deficiency, Fractures)  Urge incontinence of urine  Other fatigue - Plan: TSH, Hemoglobin A1c  Encounter for monitoring immunomodulating  therapy - Plan: SAR CoV2 Serology (COVID 19)AB(IGG)IA   1.   Continue Ocrevus RRMS.  Check labs.    2.    Stay active and exercise as tolerated.   Due to physical and cognitive impairment and fatigue she is disabled and unable to return to work.   3.    Check left knee x-ray.     4.   Check vit D and suppl. if needed (may do better with lower dose as high dose caused diarrhea.   Melatonin for insomnia 3 mg.   5.   SHe will return to see [place] in 6 months or sooner if there are new or worsening neurologic symptoms. 3.   We will complete her disability forms. rtc 6 months or sooner if ne or worsening issues  Kirsti Mcalpine A. Felecia Shelling, MD, PhD, FAAN Certified in Neurology, Clinical Neurophysiology, Sleep Medicine, Pain Medicine and Neuroimaging Director, [place] [facility] [address] [place], [place] [address] [phone]

## 2020-07-12 ENCOUNTER — Telehealth: Payer: Self-pay | Admitting: Neurology

## 2020-07-12 DIAGNOSIS — Z0289 Encounter for other administrative examinations: Secondary | ICD-10-CM

## 2020-07-12 LAB — HEMOGLOBIN A1C
Est. average glucose Bld gHb Est-mCnc: 120 mg/dL
Hgb A1c MFr Bld: 5.8 % — ABNORMAL HIGH (ref 4.8–5.6)

## 2020-07-12 LAB — CBC WITH DIFFERENTIAL/PLATELET
Basophils Absolute: 0 10*3/uL (ref 0.0–0.2)
Basos: 1 %
EOS (ABSOLUTE): 0.2 10*3/uL (ref 0.0–0.4)
Eos: 6 %
Hematocrit: 42.3 % (ref 34.0–46.6)
Hemoglobin: 13.4 g/dL (ref 11.1–15.9)
Immature Grans (Abs): 0 10*3/uL (ref 0.0–0.1)
Immature Granulocytes: 0 %
Lymphocytes Absolute: 1.6 10*3/uL (ref 0.7–3.1)
Lymphs: 37 %
MCH: 26.3 pg — ABNORMAL LOW (ref 26.6–33.0)
MCHC: 31.7 g/dL (ref 31.5–35.7)
MCV: 83 fL (ref 79–97)
Monocytes Absolute: 0.3 10*3/uL (ref 0.1–0.9)
Monocytes: 7 %
Neutrophils Absolute: 2.2 10*3/uL (ref 1.4–7.0)
Neutrophils: 49 %
Platelets: 352 10*3/uL (ref 150–450)
RBC: 5.1 x10E6/uL (ref 3.77–5.28)
RDW: 15 % (ref 11.7–15.4)
WBC: 4.4 10*3/uL (ref 3.4–10.8)

## 2020-07-12 LAB — IGG, IGA, IGM
IgA/Immunoglobulin A, Serum: 90 mg/dL (ref 87–352)
IgG (Immunoglobin G), Serum: 767 mg/dL (ref 586–1602)
IgM (Immunoglobulin M), Srm: 79 mg/dL (ref 26–217)

## 2020-07-12 LAB — COMPREHENSIVE METABOLIC PANEL
ALT: 11 IU/L (ref 0–32)
AST: 14 IU/L (ref 0–40)
Albumin/Globulin Ratio: 2 (ref 1.2–2.2)
Albumin: 4.6 g/dL (ref 3.8–4.9)
Alkaline Phosphatase: 178 IU/L — ABNORMAL HIGH (ref 44–121)
BUN/Creatinine Ratio: 20 (ref 9–23)
BUN: 21 mg/dL (ref 6–24)
Bilirubin Total: 0.4 mg/dL (ref 0.0–1.2)
CO2: 24 mmol/L (ref 20–29)
Calcium: 10 mg/dL (ref 8.7–10.2)
Chloride: 99 mmol/L (ref 96–106)
Creatinine, Ser: 1.04 mg/dL — ABNORMAL HIGH (ref 0.57–1.00)
GFR calc Af Amer: 70 mL/min/{1.73_m2} (ref >59)
GFR calc non Af Amer: 61 mL/min/{1.73_m2} (ref >59)
Globulin, Total: 2.3 g/dL (ref 1.5–4.5)
Glucose: 86 mg/dL (ref 65–99)
Potassium: 4.2 mmol/L (ref 3.5–5.2)
Sodium: 138 mmol/L (ref 134–144)
Total Protein: 6.9 g/dL (ref 6.0–8.5)

## 2020-07-12 LAB — VITAMIN D 25 HYDROXY (VIT D DEFICIENCY, FRACTURES): Vit D, 25-Hydroxy: 30.7 ng/mL (ref 30.0–100.0)

## 2020-07-12 LAB — TSH: TSH: 1.25 u[IU]/mL (ref 0.450–4.500)

## 2020-07-12 LAB — SAR COV2 SEROLOGY (COVID19)AB(IGG),IA: DiaSorin SARS-CoV-2 Ab, IgG: NEGATIVE

## 2020-07-12 NOTE — Telephone Encounter (Signed)
I spoke to Pamela Potter about the lab results: 1.   The antibodies for COVID-19 were negative.  Despite her having the original vaccination and a booster about 6 weeks ago.  We discussed that that implies that she had a suboptimal response to the vaccination which is frequently seen with Ocrevus.  We do not know if she has had a little response that was not enough to test positive.  However, she should try to take a little bit extra caution and avoid places where she will the close to multiple other people.  2.   Vitamin D was borderline low so she will take 5000 units daily 3/.  Hemoglobin A1c was borderline high which has happened in the past.  She will let her primary care know.

## 2020-10-03 ENCOUNTER — Telehealth: Payer: Self-pay | Admitting: Neurology

## 2020-10-03 NOTE — Telephone Encounter (Signed)
Pt called and stated that she has been diagnosed with Covid and she is wanting to know if provider recommends a regeneron infusion. Please advise.

## 2020-10-03 NOTE — Telephone Encounter (Signed)
Called and informed pt that provider recommends the antibody infusion and to reach out to her PCP to get set up for it. Pt agreed to reach out to her PCP.

## 2020-10-03 NOTE — Telephone Encounter (Signed)
Spoke with Jael, she will call pt back to let her know Dr. Epimenio Foot recommends monoclonal antibody infusion. She should contact PCP to get set up for this.

## 2020-10-04 ENCOUNTER — Other Ambulatory Visit (HOSPITAL_COMMUNITY): Payer: Self-pay | Admitting: Pulmonary Disease

## 2020-10-04 ENCOUNTER — Telehealth: Payer: Self-pay | Admitting: Unknown Physician Specialty

## 2020-10-04 NOTE — Telephone Encounter (Signed)
I connected by phone with Pamela Potter on 10/04/2020 at 3:49 PM to discuss the potential use of a new treatment for mild to moderate COVID-19 viral infection in non-hospitalized patients.  This patient is a 54 y.o. female that meets the FDA criteria for Emergency Use Authorization of COVID monoclonal antibody casirivimab/imdevimab, bamlanivimab/etesevimab, or sotrovimab.  Has a (+) direct SARS-CoV-2 viral test result  Has mild or moderate COVID-19   Is NOT hospitalized due to COVID-19  Is within 10 days of symptom onset  Has at least one of the high risk factor(s) for progression to severe COVID-19 and/or hospitalization as defined in EUA.  Specific high risk criteria : Immunosuppressive Disease or Treatment   I have spoken and communicated the following to the patient or parent/caregiver regarding COVID monoclonal antibody treatment:  1. FDA has authorized the emergency use for the treatment of mild to moderate COVID-19 in adults and pediatric patients with positive results of direct SARS-CoV-2 viral testing who are 46 years of age and older weighing at least 40 kg, and who are at high risk for progressing to severe COVID-19 and/or hospitalization.  2. The significant known and potential risks and benefits of COVID monoclonal antibody, and the extent to which such potential risks and benefits are unknown.  3. Information on available alternative treatments and the risks and benefits of those alternatives, including clinical trials.  4. Patients treated with COVID monoclonal antibody should continue to self-isolate and use infection control measures (e.g., wear mask, isolate, social distance, avoid sharing personal items, clean and disinfect "high touch" surfaces, and frequent handwashing) according to CDC guidelines.   5. The patient or parent/caregiver has the option to accept or refuse COVID monoclonal antibody treatment.  After reviewing this information with the patient, the  patient has agreed to receive one of the available covid 19 monoclonal antibodies and will be provided an appropriate fact sheet prior to infusion. Gabriel Cirri, NP 10/04/2020 3:49 PM  Sx onset 12/25 However prioritizing unvaccinated patients.  Will put on wait list

## 2020-10-05 ENCOUNTER — Other Ambulatory Visit: Payer: Self-pay | Admitting: Nurse Practitioner

## 2020-10-05 ENCOUNTER — Ambulatory Visit (HOSPITAL_COMMUNITY)
Admission: RE | Admit: 2020-10-05 | Discharge: 2020-10-05 | Disposition: A | Discharge status: Home / Self Care | Payer: 59 | Source: Ambulatory Visit | Attending: Pulmonary Disease | Admitting: Pulmonary Disease

## 2020-10-05 DIAGNOSIS — U071 COVID-19: Secondary | ICD-10-CM

## 2020-10-05 MED ORDER — DIPHENHYDRAMINE HCL 50 MG/ML IJ SOLN: 50.0000 mg | Freq: Once | INTRAMUSCULAR | Status: DC | PRN

## 2020-10-05 MED ORDER — FAMOTIDINE IN NACL 20-0.9 MG/50ML-% IV SOLN: 20.0000 mg | Freq: Once | INTRAVENOUS | Status: DC | PRN

## 2020-10-05 MED ORDER — METHYLPREDNISOLONE SODIUM SUCC 125 MG IJ SOLR: 125.0000 mg | Freq: Once | INTRAMUSCULAR | Status: DC | PRN

## 2020-10-05 MED ORDER — SODIUM CHLORIDE 0.9 % IV SOLN: INTRAVENOUS | Status: DC | PRN

## 2020-10-05 MED ORDER — ALBUTEROL SULFATE HFA 108 (90 BASE) MCG/ACT IN AERS: 2.0000 | INHALATION_SPRAY | Freq: Once | RESPIRATORY_TRACT | Status: DC | PRN

## 2020-10-05 MED ORDER — SODIUM CHLORIDE 0.9 % IV SOLN: Freq: Once | INTRAVENOUS | Status: AC

## 2020-10-05 MED ORDER — SODIUM CHLORIDE 0.9 % IV BOLUS
500.0000 mL | Freq: Once | INTRAVENOUS | Status: AC
  Administered 2020-10-05: 500 mL via INTRAVENOUS

## 2020-10-05 MED ORDER — EPINEPHRINE 0.3 MG/0.3ML IJ SOAJ: 0.3000 mg | Freq: Once | INTRAMUSCULAR | Status: DC | PRN

## 2020-10-05 NOTE — Progress Notes (Signed)
10/05/2020  Patient c/o dehydration, decrease fluid intake. bolus of NS given.

## 2020-10-05 NOTE — Progress Notes (Signed)
Patient qualifies for Mab treatment due to immunosuppressive illness/medications.

## 2020-10-05 NOTE — Progress Notes (Signed)
°  Diagnosis: COVID-19 ° °Physician: Dr. Patrick Wright ° °Procedure: Covid Infusion Clinic Med: casirivimab\imdevimab infusion - Provided patient with casirivimab\imdevimab fact sheet for patients, parents and caregivers prior to infusion. ° °Complications: No immediate complications noted. ° °Discharge: Discharged home  ° °Pamela Potter °10/05/2020 ° °

## 2020-10-05 NOTE — Progress Notes (Signed)
Patient reviewed Fact Sheet for Patients, Parents, and Caregivers for Emergency Use Authorization (EUA) of Casirivimab and Imdevimab for the Treatment of Coronavirus. Patient also reviewed and is agreeable to the estimated cost of treatment. Patient is agreeable to proceed.   

## 2020-10-05 NOTE — Discharge Instructions (Signed)
10 Things You Can Do to Manage Your COVID-19 Symptoms at Home If you have possible or confirmed COVID-19: 1. Stay home from work and school. And stay away from other public places. If you must go out, avoid using any kind of public transportation, ridesharing, or taxis. 2. Monitor your symptoms carefully. If your symptoms get worse, call your healthcare provider immediately. 3. Get rest and stay hydrated. 4. If you have a medical appointment, call the healthcare provider ahead of time and tell them that you have or may have COVID-19. 5. For medical emergencies, call 911 and notify the dispatch personnel that you have or may have COVID-19. 6. Cover your cough and sneezes with a tissue or use the inside of your elbow. 7. Wash your hands often with soap and water for at least 20 seconds or clean your hands with an alcohol-based hand sanitizer that contains at least 60% alcohol. 8. As much as possible, stay in a specific room and away from other people in your home. Also, you should use a separate bathroom, if available. If you need to be around other people in or outside of the home, wear a mask. 9. Avoid sharing personal items with other people in your household, like dishes, towels, and bedding. 10. Clean all surfaces that are touched often, like counters, tabletops, and doorknobs. Use household cleaning sprays or wipes according to the label instructions. cdc.gov/coronavirus 04/08/2019 This information is not intended to replace advice given to you by your health care provider. Make sure you discuss any questions you have with your health care provider. Document Revised: 09/10/2019 Document Reviewed: 09/10/2019 Elsevier Patient Education  2020 Elsevier Inc. What types of side effects do monoclonal antibody drugs cause?  Common side effects  In general, the more common side effects caused by monoclonal antibody drugs include: . Allergic reactions, such as hives or itching . Flu-like signs and  symptoms, including chills, fatigue, fever, and muscle aches and pains . Nausea, vomiting . Diarrhea . Skin rashes . Low blood pressure   The CDC is recommending patients who receive monoclonal antibody treatments wait at least 90 days before being vaccinated.  Currently, there are no data on the safety and efficacy of mRNA COVID-19 vaccines in persons who received monoclonal antibodies or convalescent plasma as part of COVID-19 treatment. Based on the estimated half-life of such therapies as well as evidence suggesting that reinfection is uncommon in the 90 days after initial infection, vaccination should be deferred for at least 90 days, as a precautionary measure until additional information becomes available, to avoid interference of the antibody treatment with vaccine-induced immune responses. If you have any questions or concerns after the infusion please call the Advanced Practice Provider on call at [phone]. This number is ONLY intended for your use regarding questions or concerns about the infusion post-treatment side-effects.  Please do not provide this number to others for use. For return to work notes please contact your primary care provider.   If someone you know is interested in receiving treatment please have them call the COVID hotline at [phone].   

## 2020-11-10 ENCOUNTER — Other Ambulatory Visit: Payer: 59 | Admitting: Physician Assistant

## 2020-11-10 DIAGNOSIS — R0789 Other chest pain: Secondary | ICD-10-CM

## 2020-11-24 ENCOUNTER — Other Ambulatory Visit: Payer: Self-pay

## 2020-11-24 ENCOUNTER — Ambulatory Visit
Admission: RE | Admit: 2020-11-24 | Discharge: 2020-11-24 | Disposition: A | Discharge status: Home / Self Care | Payer: 59 | Source: Ambulatory Visit | Attending: Physician Assistant | Admitting: Physician Assistant

## 2020-11-24 DIAGNOSIS — R0789 Other chest pain: Secondary | ICD-10-CM

## 2020-11-24 MED ORDER — IOPAMIDOL (ISOVUE-370) INJECTION 76%
75.0000 mL | Freq: Once | INTRAVENOUS | Status: AC | PRN
  Administered 2020-11-24: 75 mL via INTRAVENOUS

## 2020-11-27 ENCOUNTER — Emergency Department (HOSPITAL_BASED_OUTPATIENT_CLINIC_OR_DEPARTMENT_OTHER)
Admission: EM | Admit: 2020-11-27 | Discharge: 2020-11-27 | Disposition: A | Discharge status: Home / Self Care | Payer: 59 | Attending: Emergency Medicine | Admitting: Emergency Medicine

## 2020-11-27 ENCOUNTER — Encounter (HOSPITAL_BASED_OUTPATIENT_CLINIC_OR_DEPARTMENT_OTHER): Payer: Self-pay | Admitting: Emergency Medicine

## 2020-11-27 ENCOUNTER — Other Ambulatory Visit: Payer: Self-pay

## 2020-11-27 DIAGNOSIS — U071 COVID-19: Secondary | ICD-10-CM | POA: Diagnosis not present

## 2020-11-27 DIAGNOSIS — U099 Post covid-19 condition, unspecified: Secondary | ICD-10-CM

## 2020-11-27 DIAGNOSIS — I10 Essential (primary) hypertension: Secondary | ICD-10-CM | POA: Diagnosis not present

## 2020-11-27 DIAGNOSIS — Z79899 Other long term (current) drug therapy: Secondary | ICD-10-CM | POA: Diagnosis not present

## 2020-11-27 DIAGNOSIS — R0602 Shortness of breath: Secondary | ICD-10-CM | POA: Diagnosis present

## 2020-11-27 NOTE — ED Provider Notes (Signed)
[facility] Provider Note   CSN: [id number] Arrival date & time: 11/27/20  1052     History Chief Complaint  Patient presents with  . Cough    Pamela Potter is a 55 y.o. female.  HPI 55 year old female with a history of anemia, hypertension, MS, recent COVID-19 infection in December presents to the ER with complaints of shortness of breath, intermittent headaches, metallic taste, low-grade fevers of 99/100.  Patient has been following with her PCP for her symptoms, he ordered CT angio of her chest which was performed on 2/17.  Patient states that when she read the results, she thought that there was something abnormal and she needed to be treated but had not heard from her primary care doctor.  Per chart review, CT scan showed groundglass opacities which could reflect areas of atelectasis, atypical infection or alveolitis.  She has had no change in her symptoms, symptoms have not been worsening.  She currently has no chest pain, it has been several days since she has had chest pain.  She has been getting tension headaches which have been relieved by Tylenol over the last few months as well.  She does have a history of MS, but does not feel like she is having any MS flares.  She also complains of a metallic taste in her mouth which has been ongoing since her Covid diagnosis.  She reports that she has not been eating as much because she has no taste.  Past Medical History:  Diagnosis Date  . Anemia   . Hypertension   . Multiple sclerosis ([place])   . Neuromuscular disorder ([place])   . Vertigo   . Vision abnormalities     Patient Active Problem List   Diagnosis Date Noted  . High risk medication use 01/05/2020  . Left knee pain 01/05/2020  . Atypical facial pain 07/08/2019  . Vitamin D deficiency 07/07/2018  . Multiple sclerosis ([place]) 10/11/2016  . Gait disturbance 10/11/2016  . Urge incontinence of urine 10/11/2016  . Numbness 10/11/2016  . 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 EYE SURGERY       OB History   No obstetric history on file.     Family History  Problem Relation Age of Onset  . Hyperlipidemia Mother   . Hypertension Mother   . Stroke Father   . Diabetes Father     Social History   Tobacco Use  . Smoking status: Never Smoker  . Smokeless tobacco: Never Used  Substance Use Topics  . Alcohol use: Never    Alcohol/week: 0.0 standard drinks  . Drug use: Never    Home Medications Prior to Admission medications   Medication Sig Start Date End Date Taking? Authorizing Provider  carvedilol (COREG) 3.125 MG tablet Take 3.125 mg by mouth 2 (two) times daily with a meal. Prn    [provider]  levothyroxine (SYNTHROID, LEVOTHROID) 50 MCG tablet Take 50 mcg by mouth daily before breakfast.    [provider]  ocrelizumab 600 mg in sodium chloride 0.9 % 500 mL Inject 600 mg into the vein every 6 (six) months.    [provider]  prednisoLONE acetate (PRED MILD) 0.12 % ophthalmic suspension 1 drop 4 (four) times daily.    [provider]  timolol (BETIMOL) 0.5 % ophthalmic solution Place 1 drop into the right eye daily.    [provider]  triamterene-hydrochlorothiazide (DYAZIDE) 37.5-25 MG capsule Take 1 capsule by mouth daily.    [provider]    Allergies    Ace inhibitors, Sulfa antibiotics, Amoxicillin, Benadryl [diphenhydramine], Dilantin [phenytoin sodium extended], Lisinopril, and Norvasc [amlodipine besylate]  Review of Systems   Review of Systems  Constitutional: Negative for chills and fever.  HENT: Negative for ear pain and sore throat.   Eyes: Negative for pain and visual disturbance.  Respiratory: Positive for shortness of breath. Negative for cough.   Cardiovascular: Negative for chest pain and palpitations.  Gastrointestinal: Negative for abdominal pain  and vomiting.  Genitourinary: Negative for dysuria and hematuria.  Musculoskeletal: Negative for arthralgias and back pain.  Skin: Negative for color change and rash.  Neurological: Positive for headaches. Negative for dizziness, seizures, syncope and weakness.  All other systems reviewed and are negative.   Physical Exam Updated Vital Signs BP 123/84 (BP Location: Right Arm)   Pulse 77   Temp 98.5 F (36.9 C)   Resp 18   Ht 5\' 3"  (1.6 m)   Wt 65.3 kg   LMP 01/01/2017 (Exact Date)   SpO2 99%   BMI 25.51 kg/m   Physical Exam Vitals and nursing note reviewed.  Constitutional:      General: She is not in acute distress.    Appearance: She is well-developed and well-nourished. She is not ill-appearing or diaphoretic.  HENT:     Head: Normocephalic and atraumatic.  Eyes:     Conjunctiva/sclera: Conjunctivae normal.  Cardiovascular:     Rate and Rhythm: Normal rate and regular rhythm.     Pulses: Normal pulses.     Heart sounds: Normal heart sounds. No murmur heard.   Pulmonary:     Effort: Pulmonary effort is normal. No respiratory distress.     Breath sounds: Normal breath sounds.  Abdominal:     General: Abdomen is flat.     Palpations: Abdomen is soft.     Tenderness: There is no abdominal tenderness.  Musculoskeletal:        General: No edema. Normal range of motion.     Cervical back: Neck supple.     Right lower leg: No edema.     Left lower leg: No edema.  Skin:    General: Skin is warm and dry.  Neurological:     General: No focal deficit present.     Mental Status: She is alert and oriented to person, place, and time.  Psychiatric:        Mood and Affect: Mood and affect and mood normal.        Behavior: Behavior normal.     ED Results / Procedures / Treatments   Labs (all labs ordered are listed, but only abnormal results are displayed) Labs Reviewed - No data to display  EKG EKG Interpretation  Date/Time:  Sunday November 27 2020 11:13:09  EST Ventricular Rate:  76 PR Interval:    QRS Duration: 71 QT Interval:  373 QTC Calculation: 420 R Axis:   54 Text Interpretation: Sinus rhythm No significant change since prior 3/03 Confirmed by Aletta Edouard [phone]) on 11/27/2020 11:20:14 AM   Radiology No results found.  Procedures Procedures   Medications Ordered in ED Medications - No data to display  ED Course  I have reviewed the triage vital signs and the nursing notes.  Pertinent labs & imaging results that were available during my care of the patient were reviewed by me and considered in my medical  decision making (see chart for details).    MDM Rules/Calculators/A&P                          55 year old female who presents to the ER with ongoing shortness of breath, headaches, metallic taste since her [id number] diagnosis in December 2021.Marland Kitchen  She has been closely followed by her PCP, who recently ordered a PE study given ongoing shortness of breath.  I personally reviewed this, which showed some groundglass opacities which may be consistent with atelectasis, atypical infection.  Findings likely consistent s/p  COVID-19 infection.  No evidence of bacterial pneumonia or PE.  Vitals on arrival here overall very reassuring, no evidence of sepsis, not tachycardic hypoxic or tachypneic.  Low suspicion for PE given reassuring CT scan just several days ago.  EKG here normal sinus rhythm.  Low suspicion for ACS at this time as her symptoms have been ongoing for several months.  No evidence of sepsis at this time.  Partook in a shared decision-making conversation with the patient for further work-up, patient reports that she has had extensive lab work done per her PCP recently, though unfortunately I am not able to review this in her chart.  I did offer potential further lab work, however I believe that this would be redundant given she has had lab work done recently and the patient states that she would rather not repeat lab work today.   We did discuss the CT scan findings, I explained that these are likely changes status post COVID, there is no indication of any new superimposed bacterial infection that would require antibiotic treatment.  She is overall reassured, and is agreeable to foregoing any further work-up at this time.  She plans to follow-up with her PCP within the next week.  We did discuss return precautions, patient voiced understanding and is agreeable.  Stable for discharge at this time.  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

## 2020-11-27 NOTE — ED Notes (Signed)
Pt discharged to home. Discharge instructions have been discussed with patient and/or family members. Pt verbally acknowledges understanding d/c instructions, and endorses comprehension to checkout at registration before leaving.  °

## 2020-11-27 NOTE — Discharge Instructions (Addendum)
Please make sure to follow-up with your primary care doctor soon as possible.  Return to the ER for any new or worsening symptoms.

## 2020-11-27 NOTE — ED Triage Notes (Signed)
Pt arrives pov with c/o cough, fever and CP. Pt endorses Covid in December, still having symptoms. Pt awaiting results of CT to be discussed with PCP

## 2020-11-27 NOTE — ED Notes (Signed)
Pt ambulated to room on Pulse ox-o2 sats 97%-100%, HR 110

## 2020-11-27 NOTE — ED Notes (Signed)
Pt also endorses HA

## 2020-11-29 ENCOUNTER — Telehealth: Payer: Self-pay | Admitting: Neurology

## 2020-11-29 NOTE — Telephone Encounter (Signed)
Pt called, having chest pain in breast area. PCP sent me for CT scan Thursday, 2/17, came back normal, checked for blood clots everything fine. Went ER Sunday, 2/20, did a EKG and was normal. I'm lying down right now, I have a dull aching pain in my chest. I believe it is MS related.  Have scheduled a appt to see Dr. Felecia Shelling 3/2. Would like a call from the nurse.

## 2020-11-29 NOTE — Telephone Encounter (Signed)
Dull aching sensation is usually is not MS related.  However, this seems to be more in the skin it could be MS related.  I would not recommend any medication until she is seen.  We will try to figure this out when she comes in on 3/2

## 2020-11-29 NOTE — Telephone Encounter (Signed)
Called pt back to further discuss. She has had ongoing dull chest pain since 08/2020. She was told then that there was some inflammation in her chest wall and to take ibuprofen. 09/2019 she got covid-19. She has been through a lot of testing via PCP and cannot find reason for sx. Wondering if it is her MS/MS hug? She has scheduled appt to see Dr. Felecia Shelling on 12/07/20 at 10am but would like to know what MD would recommend first. Wants him to review what tests have been done and if he thinks it is her MS or something else. Advised I will send message to MD and call back once he lets me know what he recommends.

## 2020-11-29 NOTE — Telephone Encounter (Signed)
Called and spoke with pt. Relayed Dr. Garth Bigness message. She verbalized understanding. She will keep appt next week with Dr. Felecia Shelling. I cx appt 01/09/21 w/ Amy since she will be seen sooner by MD. Pt agreed to this.

## 2020-12-07 ENCOUNTER — Encounter: Payer: Self-pay | Admitting: Neurology

## 2020-12-07 ENCOUNTER — Ambulatory Visit: Payer: 59 | Admitting: Neurology

## 2020-12-07 VITALS — BP 119/79 | HR 99 | Ht 63.0 in | Wt 144.0 lb

## 2020-12-07 DIAGNOSIS — N3941 Urge incontinence: Secondary | ICD-10-CM

## 2020-12-07 DIAGNOSIS — Z79899 Other long term (current) drug therapy: Secondary | ICD-10-CM | POA: Diagnosis not present

## 2020-12-07 DIAGNOSIS — E559 Vitamin D deficiency, unspecified: Secondary | ICD-10-CM

## 2020-12-07 DIAGNOSIS — R269 Unspecified abnormalities of gait and mobility: Secondary | ICD-10-CM

## 2020-12-07 DIAGNOSIS — R5383 Other fatigue: Secondary | ICD-10-CM

## 2020-12-07 DIAGNOSIS — G35 Multiple sclerosis: Secondary | ICD-10-CM

## 2020-12-07 DIAGNOSIS — G35D Multiple sclerosis, unspecified: Secondary | ICD-10-CM

## 2020-12-07 NOTE — Progress Notes (Signed)
[facility]  PATIENT: Pamela Potter DOB: 06-04-1966  REFERRING DOCTOR OR PCP:  Dr. Starleen Blue    Dr. Maude Leriche is PCP [name] SOURCE: patient, records from Dr. Starleen Blue. Laboratory results, MRI results, multiple MRI images on PACS  _________________________________   HISTORICAL  CHIEF COMPLAINT:  Chief Complaint  Patient presents with  . Follow-up    RM 12. Last seen 07/11/2020. On Ocrevus for MS. Last infusion: 07/06/20, next: 01/18/21. Receives at [facility] w/ intrafusion. Here today to be evaluated for ongoing dull chest pain. Recently placed on doxycycline d/t recent CT chest findings. Feeling better since starting this. Has no appetite. Has worsened since she had covid-19.     HISTORY OF PRESENT ILLNESS:  Pamela Potter is a 55 y.o. woman with relapsing remitting multiple sclerosis.  Update 12/07/2020 She is on Ocrevus.  She feels her MS is doing ok and has no recent exacerbation or new MS symptoms.   She tolerates Ocrevus well.     She had Covid-25 September 2020.  She had her vaccinations and a booster.  She just had mild symptoms (stuffynose cough and HA).    She was treated with the RegeneronShe was experiencing chest discomfort in the sternum a month later that worsened with eating or coughing.   She was placed on a steroid pack and doxycycline after CT showed opacities (atelectasis vs. Atypical infection).   She feels better since starting.   She is starting to feel better but is not baseline.   Of note despite the vaccination and booster, her SARS-CoV2 IgG was negative 07/2020.     Gait is ok but she fatigues easily with activities.  She can go about 1/2 to 1 mile without break but may stumble more after she walks longer distance.   She does worse in heat. She needs to hold the bannister on stairs.    She feels her right leg does worse than her left leg.   She does note some left knee pain..   She has urinary urgency and occasional incontinence and wears  pads.  She prefers not to be on a bladder medication.   She also gets frequent diarrhea, especially if nervous.  Imodium or kaopectate can help.   Vision has done well.     She notes more fatigue since Covid-19.     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  She is often hot at night and has trouble getting comfortable.  She twists a lot at night even while asleep.     She denies depression.  She notes verbal fluency issues and is forgetful.    She takes Vit D supplements 2000 U  daily .   Last level was 30.7.   IgG and IgM were fine 07/11/20.   MS History: She was diagnosed with MS in 1991 after presenting with gait disturbance, weakness and vertigo. An MRI of the brain showed plaques and a lumbar puncture were reportedly consistent with MS. She saw Dr. Adolphus Birchwood a couple years later and was started her on Avonex.   She stayed on Avonex for about 10 years but then had new lesions on her MRI. She was referred to Dr. Jacqulynn Cadet. He placed her on Tysabri and she stayed on for a few years. 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 Unfortunately, on Gilenya, she had some MRI progression and Tysabri was restarted. . Of note, her JCV  antibody titers were initially less than 0.9. However, over the last couple years her titers have increased and her last JCV antibody was 1.03. She was switched to ocrelizumab and had her first dose the end of June 2017 and her second dose in mid July. She has not noted any exacerbations while on ocrelizumab.   DATA: MRI images from 02/10/2016 show classic T2/FLAIR hyperintense foci in the periventricular, juxtacortical and deep white matter of both hemispheres and also in the pons and right middle cerebellar peduncle in a pattern and configuration consistent with chronic demyelinating plaque associated with MS.   There were no acute findings and no change when compared to the previous MRI, from  05/17/2014  The MRI of the cervical and thoracic spine 2017 showed foci in the upper cervical spine and in the mid to lower thoracic spine. None of these were acute.      I reviewed her labs. Her JCV antibody on 12/25/2015 was 1.03 (middle positive). She had normal CD4 and CD8 cells.   Hepatitis B labs and Quantiferon TB test were negative or nonreactive.  MRI 02/12/2020 cervical spine:  Patchy T2 hyperintense foci within the upper cervical spine as described above.  None of the foci appear to be acute and there are no definite new lesions compared to the previous MRI from 2015.   Small left paramedian disc protrusion at C6-C7, not present on the 2015 MRI, that does not lead to nerve root compression or spinal stenosis  MRI 02/12/2020 Brain:  Multiple T2/FLAIR hyperintense foci in the brainstem, middle cerebellar peduncles and hemispheres in a pattern and configuration consistent with chronic demyelinating plaque associated with multiple sclerosis.  None of the foci appear to be acute.  Compared to the MRI from 01/08/2018, there are no new lesions.    REVIEW OF SYSTEMS: Constitutional: No fevers, chills, sweats, or change in appetite.  Reports fatigue eyes: as abvove Ear, nose and throat: No hearing loss, ear pain, nasal congestion, sore throat Cardiovascular: No chest pain, palpitations Respiratory: No shortness of breath at rest or with exertion.   No wheezes GastrointestinaI: No nausea, vomiting, diarrhea, abdominal pain, fecal incontinence Genitourinary: Notes frequency and nocturia with occasional urge incontinence Musculoskeletal: No neck pain, back pain Integumentary: No rash, pruritus, skin lesions Neurological: as above Psychiatric: No depression at this time.  She denies anxiety. Endocrine: No palpitations, diaphoresis, change in appetite, change in weigh or increased thirst Hematologic/Lymphatic: No anemia, purpura, petechiae. Allergic/Immunologic: No itchy/runny eyes, nasal  congestion, recent allergic reactions, rashes  ALLERGIES: Allergies  Allergen Reactions  . Ace Inhibitors Swelling    lips  . Sulfa Antibiotics Hives and Itching  . Amoxicillin Diarrhea  . Benadryl [Diphenhydramine] Other (See Comments)    Difficulty swallowing  . Dilantin [Phenytoin Sodium Extended]   . Lisinopril   . Norvasc [Amlodipine Besylate]     HOME MEDICATIONS:  Current Outpatient Medications:  .  carvedilol (COREG) 6.25 MG tablet, Take 6.25 mg by mouth 2 (two) times daily with a meal., Disp: , Rfl:  .  doxycycline (VIBRAMYCIN) 100 MG capsule, Take 100 mg by mouth 2 (two) times daily. Taking for three more days (12/07/20), Disp: , Rfl:  .  levothyroxine (SYNTHROID, LEVOTHROID) 50 MCG tablet, Take 50 mcg by mouth daily before breakfast., Disp: , Rfl:  .  ocrelizumab 600 mg in sodium chloride 0.9 % 500 mL, Inject 600 mg into the vein every 6 (six) months., Disp: , Rfl:  .  prednisoLONE acetate (PRED MILD) 0.12 %  ophthalmic suspension, 1 drop 4 (four) times daily., Disp: , Rfl:  .  timolol (BETIMOL) 0.5 % ophthalmic solution, Place 1 drop into the right eye daily., Disp: , Rfl:  .  triamterene-hydrochlorothiazide (DYAZIDE) 37.5-25 MG capsule, Take 1 capsule by mouth daily., Disp: , Rfl:   PAST MEDICAL HISTORY: Past Medical History:  Diagnosis Date  . Anemia   . Hypertension   . Multiple sclerosis ([place])   . Neuromuscular disorder ([place])   . Vertigo   . Vision abnormalities     PAST SURGICAL HISTORY: Past Surgical History:  Procedure Laterality Date  . EYE SURGERY      FAMILY HISTORY: Family History  Problem Relation Age of Onset  . Hyperlipidemia Mother   . Hypertension Mother   . Stroke Father   . Diabetes Father     SOCIAL HISTORY:  Social History   Socioeconomic History  . Marital status: Married    Spouse name: Not on file  . Number of children: Not on file  . Years of education: Not on file  . Highest education level: Not on file  Occupational  History  . Not on file  Tobacco Use  . Smoking status: Never Smoker  . Smokeless tobacco: Never Used  Substance and Sexual Activity  . Alcohol use: Never    Alcohol/week: 0.0 standard drinks  . Drug use: Never  . Sexual activity: Not on file  Other Topics Concern  . Not on file  Social History Narrative  . Not on file   Social Determinants of Health   Financial Resource Strain: Not on file  Food Insecurity: Not on file  Transportation Needs: Not on file  Physical Activity: Not on file  Stress: Not on file  Social Connections: Not on file  Intimate Partner Violence: Not on file     PHYSICAL EXAM  Vitals:   12/07/20 0941  BP: 119/79  Pulse: 99  Weight: 144 lb (65.3 kg)  Height: 5\' 3"  (1.6 m)    Body mass index is 25.51 kg/m.   General: The patient is well-developed and well-nourished and in no acute distress   Neurologic Exam  Mental status: The patient is alert and oriented x 3 at the time of the examination. The patient has apparent normal recent and remote memory, with an apparently normal attention span and concentration ability.   Speech is normal.  Cranial nerves: Extraocular movements are full.  Facial strength and sensation are normal.  Trapezius strength is good.   No obvious hearing deficits are noted.  Motor:  Muscle bulk is normal but tone is mildly increased in legs.. Strength is  5 / 5 in all 4 extremities except 4 distal right foot.   Sensory: Intact touch and vibration in arms and legs  Coordination:   Good bilateral FTN but reduced heel to shin bilaterally  Gait and station: Station is normal.   Gait is wide and tandem gait is very wide. Mild right foot drop.  Romberg is negative.   Reflexes: Deep tendon reflexes are symmetric and normal in both arms. DTRs are increased in legs with spread at the knees.        Multiple sclerosis (HCC)  High risk medication use  Gait disturbance  Vitamin D deficiency  Urge incontinence of  urine  Other fatigue   1.   Continue Ocrevus RRMS.   2.    Stay active and exercise as tolerated.   Due to physical and cognitive impairment and fatigue she is disabled  and unable to return to work.   3.   SHe will return to see [place] in 6 months or sooner if there are new or worsening neurologic symptoms. 4.  rtc 6 months or sooner if ne or worsening issues  Richard A. Felecia Shelling, MD, PhD, FAAN Certified in Neurology, Clinical Neurophysiology, Sleep Medicine, Pain Medicine and Neuroimaging Director, [place] [facility] [address], [place], [place] [address] [phone]

## 2021-01-09 ENCOUNTER — Ambulatory Visit: Payer: 59 | Admitting: Family Medicine

## 2021-02-07 ENCOUNTER — Other Ambulatory Visit: Payer: Self-pay | Admitting: Pulmonary Disease

## 2021-02-07 ENCOUNTER — Other Ambulatory Visit: Payer: Self-pay

## 2021-02-07 ENCOUNTER — Ambulatory Visit: Payer: 59 | Admitting: Pulmonary Disease

## 2021-02-07 ENCOUNTER — Encounter: Payer: Self-pay | Admitting: Pulmonary Disease

## 2021-02-07 VITALS — BP 116/80 | HR 92 | Temp 97.5°F | Ht 63.0 in | Wt 142.8 lb

## 2021-02-07 DIAGNOSIS — R0609 Other forms of dyspnea: Secondary | ICD-10-CM

## 2021-02-07 DIAGNOSIS — R059 Cough, unspecified: Secondary | ICD-10-CM

## 2021-02-07 DIAGNOSIS — J302 Other seasonal allergic rhinitis: Secondary | ICD-10-CM | POA: Diagnosis not present

## 2021-02-07 DIAGNOSIS — R0600 Dyspnea, unspecified: Secondary | ICD-10-CM | POA: Diagnosis not present

## 2021-02-07 MED ORDER — MONTELUKAST SODIUM 10 MG PO TABS: 10.0000 mg | ORAL_TABLET | Freq: Every day | ORAL | 11 refills | Status: DC

## 2021-02-07 MED ORDER — BREO ELLIPTA 200-25 MCG/INH IN AEPB: 1.0000 | INHALATION_SPRAY | Freq: Every day | RESPIRATORY_TRACT | 11 refills | Status: DC

## 2021-02-07 MED ORDER — FLUTICASONE PROPIONATE 50 MCG/ACT NA SUSP: 1.0000 | Freq: Every day | NASAL | 2 refills | Status: DC

## 2021-02-07 NOTE — Patient Instructions (Addendum)
Nice to meet you  I am sorry that you have had such trouble with a cough and shortness of breath after your COVID infection  I think there are a couple reasons you are experiencing these symptoms.  1 is the nasal congestion leading to cough.  Use Flonase 1 spray in each nose once daily.  In addition, take montelukast 1 pill at night.  This is to help with the congestion and hopefully the cough.  2 I worry that the COVID infection create inflammation in your lungs similar to asthma.  Use Breo 1 puff daily to help with the cough and shortness of breath.  Lastly for further evaluation we will obtain PFTs or breathing test in the coming weeks.  If symptoms or not improving at next visit we may need to repeat a CT scan to make sure the infiltrates seen back and February 2022 are gone.  Return to clinic in 3 months or sooner as needed

## 2021-02-07 NOTE — Progress Notes (Signed)
@Patient  ID: Pamela Potter, female    DOB: January 26, 1966, 55 y.o.   MRN: [id number]  Chief Complaint  Patient presents with  . Consult    Referred by PCP for SOB. Was diagnosed with COVID back in December 2021. States she has had increased SOB ever since. Heat and humidity will cause the SOB to increase. Constantly clearing her throat.     Referring provider: Shirline Frees, MD  HPI:   55 year old whom we are seeing in consultation for evaluation of dyspnea on exertion and cough since COVID-19 infection 09/2020.  PCP note reviewed.  ED note 11/2020 reviewed.  Most recent neurology note reviewed.  Patient contracted COVID 09/2020.  She received antibody infusion.  She had COVID-vaccine x3 prior to infection.  Symptoms were nasal congestion, sore throat.  Mild dyspnea.  Though symptoms gradually improved except congestion in the nasal passages has persisted.  Dyspnea exertion has worsened.  Dyspnea worse up inclines or stairs.  Can be present on flat surfaces as well.  No timing during day when things are better or worse.  No seasonal or environmental changes make things better or worse.  She did receive prednisone a few days ago and this seemed to help somewhat.  Similarly, no rhyme or reason to her cough.  No relieving or exacerbating factors that she can readily identify.  No timing during day were things are better or worse.  No seasonal or environmental changes.  With the symptoms she went to the ED 11/2020 and had a CTA chest PE protocol performed which on my interpretation reveals no evidence of PE, scattered faint groundglass opacities bilaterally throughout all lobes consistent with atypical pneumonia versus resolving COVID-19 pneumonia.  She was given doxycycline.  Per neurology notes 12/2020 symptoms improved quite a bit with this intervention.  However patient does not recall this and states nothing is really helped.  PMH: Hypertension, MS Surgical history: Reviewed reviewed with patient,  she denies surgeries Family history: Reviewed with patient, she denies respiratory illnesses in first-degree relatives Social history: Never smoker, lives with husband, lives in [place] / Pulmonary Flowsheets:   ACT:  No flowsheet data found.  MMRC: No flowsheet data found.  Epworth:  No flowsheet data found.  Tests:   FENO:  No results found for: NITRICOXIDE  PFT: No flowsheet data found.  WALK:  No flowsheet data found.  Imaging: Personally reviewed and as per EMR discussion this note  Lab Results: Personally reviewed, no significant elevation of eosinophils and no anemia CBC    Component Value Date/Time   WBC 4.4 07/11/2020 1343   RBC 5.10 07/11/2020 1343   HGB 13.4 07/11/2020 1343   HCT 42.3 07/11/2020 1343   PLT 352 07/11/2020 1343   MCV 83 07/11/2020 1343   MCH 26.3 (L) 07/11/2020 1343   MCHC 31.7 07/11/2020 1343   RDW 15.0 07/11/2020 1343   LYMPHSABS 1.6 07/11/2020 1343   EOSABS 0.2 07/11/2020 1343   BASOSABS 0.0 07/11/2020 1343    BMET    Component Value Date/Time   NA 138 07/11/2020 1343   K 4.2 07/11/2020 1343   CL 99 07/11/2020 1343   CO2 24 07/11/2020 1343   GLUCOSE 86 07/11/2020 1343   BUN 21 07/11/2020 1343   CREATININE 1.04 (H) 07/11/2020 1343   CALCIUM 10.0 07/11/2020 1343   GFRNONAA 61 07/11/2020 1343   GFRAA 70 07/11/2020 1343    BNP No results found for: BNP  ProBNP No results found for: PROBNP  Specialty Problems   None     Allergies  Allergen Reactions  . Ace Inhibitors Swelling    lips  . Sulfa Antibiotics Hives and Itching  . Amoxicillin Diarrhea  . Benadryl [Diphenhydramine] Other (See Comments)    Difficulty swallowing  . Dilantin [Phenytoin Sodium Extended]   . Lisinopril   . Norvasc [Amlodipine Besylate]      There is no immunization history on file for this patient.  Past Medical History:  Diagnosis Date  . Anemia   . Hypertension   . Multiple sclerosis ([place])   . Neuromuscular  disorder ([place])   . Vertigo   . Vision abnormalities     Tobacco History: Social History   Tobacco Use  Smoking Status Never Smoker  Smokeless Tobacco Never Used   Counseling given: Not Answered   Continue to not smoke  Outpatient Encounter Medications as of 02/07/2021  Medication Sig  . albuterol (VENTOLIN HFA) 108 (90 Base) MCG/ACT inhaler Inhale 1 puff into the lungs every 4 (four) hours.  . carvedilol (COREG) 6.25 MG tablet Take 6.25 mg by mouth 2 (two) times daily with a meal.  . fluticasone (FLONASE) 50 MCG/ACT nasal spray Place 1 spray into both nostrils daily.  . fluticasone furoate-vilanterol (BREO ELLIPTA) 200-25 MCG/INH AEPB Inhale 1 puff into the lungs daily.  Marland Kitchen levothyroxine (SYNTHROID, LEVOTHROID) 50 MCG tablet Take 50 mcg by mouth daily before breakfast.  . montelukast (SINGULAIR) 10 MG tablet Take 1 tablet (10 mg total) by mouth at bedtime.  Marland Kitchen ocrelizumab 600 mg in sodium chloride 0.9 % 500 mL Inject 600 mg into the vein every 6 (six) months.  . prednisoLONE acetate (PRED MILD) 0.12 % ophthalmic suspension 1 drop 4 (four) times daily.  . timolol (BETIMOL) 0.5 % ophthalmic solution Place 1 drop into the right eye daily.  Marland Kitchen triamterene-hydrochlorothiazide (DYAZIDE) 37.5-25 MG capsule Take 1 capsule by mouth daily.  . [DISCONTINUED] doxycycline (VIBRAMYCIN) 100 MG capsule Take 100 mg by mouth 2 (two) times daily. Taking for three more days (12/07/20)   No facility-administered encounter medications on file as of 02/07/2021.     Review of Systems  Review of Systems  No chest pain with exertion.  No orthopnea or PND.  Comprehensive review of systems otherwise negative. Physical Exam  BP 116/80   Pulse 92   Temp (!) 97.5 F (36.4 C) (Temporal)   Ht 5\' 3"  (1.6 m)   Wt 142 lb 12.8 oz (64.8 kg)   LMP 01/01/2017 (Exact Date)   SpO2 96% Comment: on RA  BMI 25.30 kg/m   Wt Readings from Last 5 Encounters:  02/07/21 142 lb 12.8 oz (64.8 kg)  12/07/20 144 lb (65.3 kg)   11/27/20 144 lb (65.3 kg)  07/11/20 152 lb 8 oz (69.2 kg)  01/05/20 149 lb 8 oz (67.8 kg)    BMI Readings from Last 5 Encounters:  02/07/21 25.30 kg/m  12/07/20 25.51 kg/m  11/27/20 25.51 kg/m  07/11/20 27.01 kg/m  01/05/20 26.48 kg/m     Physical Exam General: Well-appearing, no acute distress Eyes: EOMI, no icterus Neck: Supple no JVP Cardiovascular: Regular rate and rhythm, no murmur Pulmonary: Clear to oscillation bilaterally, no wheeze or crackle, normal for breathing Abdomen: Nondistended, bowel sounds present MSK: No synovitis, no joint effusion Neuro: Normal gait, no weakness Psych: Normal mood, full affect   Assessment & Plan:   Cough: Likely multifactorial related to nasal congestion new since COVID infection.  Also concern for asthma as below.  Start  montelukast 1 pill daily and Flonase 1 spray each nare daily.  She has repeated multiple throat clearing's.  Do worry about upper airway cough syndrome and likely have formed a cough habit over the last few months based on observation of this.  This often is quite difficult to treat.  Dyspnea on exertion: Concern related to asthma given some improvement with recent steroid therapy.  Exacerbated or triggered by COVID infection.  Also likely component of deconditioning.  PFTs ordered to be obtained in the coming days.  Presumed asthma: Triggered by COVID-19 infection.  Breo high-dose for ICS/LABA therapy.  Continue albuterol as needed.  Abnormal CT scan: Scattered faint groundglass opacities 11/2020 likely reflective of resolving COVID-19 infection.  Pending results and symptomatic response to therapy as above, consider repeating CT scan to ensure infiltrates have resolved.  Return in about 3 months (around 05/10/2021).   Lanier Clam, MD 02/07/2021

## 2021-02-08 ENCOUNTER — Telehealth: Payer: Self-pay

## 2021-02-08 NOTE — Telephone Encounter (Signed)
Received fax from [facility] stating the patient said the prescription for BREO was too expensive and would like to be prescribed something else.  [place] please advise

## 2021-02-09 ENCOUNTER — Other Ambulatory Visit (HOSPITAL_COMMUNITY): Payer: Self-pay

## 2021-02-09 MED ORDER — FLUTICASONE-SALMETEROL 500-50 MCG/ACT IN AEPB: 1.0000 | INHALATION_SPRAY | Freq: Two times a day (BID) | RESPIRATORY_TRACT | 11 refills | Status: DC

## 2021-02-09 NOTE — Telephone Encounter (Signed)
Called and spoke with Pharmacy to let them know the quantity was wrong on order that was sent in. They changed it. I called and spoke with patient to let her know the information and that Dr. Silas Flood sent in RX for Advair diskus. She stated that she has already talked to the pharmacy and was going to just go ahead with the Breo inhaler since it was 1 time a day. Advised her that I would let Dr. Silas Flood know and for her to let us know if she needed anything else. She expressed understanding. Nothing further needed at this time.

## 2021-02-09 NOTE — Telephone Encounter (Signed)
Thank you! Adviar diskus high dose prescribed.

## 2021-02-09 NOTE — Telephone Encounter (Signed)
Ran through the list and it looks like Advair Diskus is going to be the best, with a copay of $50. Breo, Symbicort, and Dulera all carry a $75 copay.

## 2021-02-17 ENCOUNTER — Other Ambulatory Visit (HOSPITAL_COMMUNITY)
Admission: RE | Admit: 2021-02-17 | Discharge: 2021-02-17 | Disposition: A | Discharge status: Home / Self Care | Payer: 59 | Source: Ambulatory Visit | Attending: Pulmonary Disease | Admitting: Pulmonary Disease

## 2021-02-17 DIAGNOSIS — Z20822 Contact with and (suspected) exposure to covid-19: Secondary | ICD-10-CM | POA: Diagnosis not present

## 2021-02-17 DIAGNOSIS — Z01812 Encounter for preprocedural laboratory examination: Secondary | ICD-10-CM | POA: Insufficient documentation

## 2021-02-18 LAB — SARS CORONAVIRUS 2 (TAT 6-24 HRS): SARS Coronavirus 2: NEGATIVE

## 2021-02-20 ENCOUNTER — Ambulatory Visit (INDEPENDENT_AMBULATORY_CARE_PROVIDER_SITE_OTHER): Payer: 59 | Admitting: Pulmonary Disease

## 2021-02-20 ENCOUNTER — Other Ambulatory Visit: Payer: Self-pay

## 2021-02-20 DIAGNOSIS — R0600 Dyspnea, unspecified: Secondary | ICD-10-CM

## 2021-02-20 DIAGNOSIS — R0609 Other forms of dyspnea: Secondary | ICD-10-CM

## 2021-02-20 LAB — PULMONARY FUNCTION TEST
DL/VA % pred: 68 %
DL/VA: 2.95 ml/min/mmHg/L
DLCO cor % pred: 49 %
DLCO cor: 9.65 ml/min/mmHg
DLCO unc % pred: 49 %
DLCO unc: 9.65 ml/min/mmHg
FEF 25-75 Post: 2.93 L/sec
FEF 25-75 Pre: 2.7 L/sec
FEF2575-%Change-Post: 8 %
FEF2575-%Pred-Post: 138 %
FEF2575-%Pred-Pre: 127 %
FEV1-%Change-Post: 1 %
FEV1-%Pred-Post: 100 %
FEV1-%Pred-Pre: 99 %
FEV1-Post: 2.06 L
FEV1-Pre: 2.03 L
FEV1FVC-%Change-Post: 1 %
FEV1FVC-%Pred-Pre: 108 %
FEV6-%Change-Post: 0 %
FEV6-%Pred-Post: 93 %
FEV6-%Pred-Pre: 93 %
FEV6-Post: 2.34 L
FEV6-Pre: 2.35 L
FEV6FVC-%Pred-Post: 103 %
FEV6FVC-%Pred-Pre: 103 %
FVC-%Change-Post: 0 %
FVC-%Pred-Post: 90 %
FVC-%Pred-Pre: 90 %
FVC-Post: 2.34 L
FVC-Pre: 2.35 L
Post FEV1/FVC ratio: 88 %
Post FEV6/FVC ratio: 100 %
Pre FEV1/FVC ratio: 87 %
Pre FEV6/FVC Ratio: 100 %
RV % pred: 48 %
RV: 0.88 L
TLC % pred: 64 %
TLC: 3.12 L

## 2021-02-20 NOTE — Progress Notes (Signed)
PFT done today. 

## 2021-02-28 ENCOUNTER — Other Ambulatory Visit: Payer: Self-pay | Admitting: Pulmonary Disease

## 2021-02-28 DIAGNOSIS — R942 Abnormal results of pulmonary function studies: Secondary | ICD-10-CM

## 2021-02-28 NOTE — Progress Notes (Signed)
PFTs suggestive of ILD. CT high resolution ordered.

## 2021-02-28 NOTE — Progress Notes (Signed)
Called patient but she did not answer. Left message for patient to call back.  

## 2021-03-01 ENCOUNTER — Telehealth: Payer: Self-pay | Admitting: Pulmonary Disease

## 2021-03-01 NOTE — Telephone Encounter (Signed)
Spoke with pt who is wanting results of most recent PFT. Dr. Silas Flood could you please advise on PFT results?

## 2021-03-02 NOTE — Telephone Encounter (Signed)
PFTs consistent with restriction. I ordered a CT scan to look into why this may be. Possibly related to prior covid infection.

## 2021-03-02 NOTE — Telephone Encounter (Signed)
Patient is aware of results and voiced her understanding.  Nothing further needed at this time.

## 2021-03-16 ENCOUNTER — Ambulatory Visit
Admission: RE | Admit: 2021-03-16 | Discharge: 2021-03-16 | Disposition: A | Discharge status: Home / Self Care | Payer: 59 | Source: Ambulatory Visit | Attending: Pulmonary Disease | Admitting: Pulmonary Disease

## 2021-03-16 ENCOUNTER — Other Ambulatory Visit: Payer: Self-pay

## 2021-03-16 DIAGNOSIS — R942 Abnormal results of pulmonary function studies: Secondary | ICD-10-CM

## 2021-03-28 ENCOUNTER — Telehealth: Payer: Self-pay | Admitting: Pulmonary Disease

## 2021-03-28 NOTE — Telephone Encounter (Signed)
Called spoke with patient but CT was not resulted yet.   Will sent to Dr. Silas Flood to get recommendations for results

## 2021-03-29 MED ORDER — TRELEGY ELLIPTA 200-62.5-25 MCG/INH IN AEPB: 1.0000 | INHALATION_SPRAY | Freq: Every day | RESPIRATORY_TRACT | 11 refills | Status: DC

## 2021-03-29 NOTE — Telephone Encounter (Signed)
Called and spoke with patient. She verbalized understanding of results. She has concerns over the size of her heart. In the CT scan from February 2022 it stated that her heart size was normal. The most recent CT states her heart size is approaching the upper limit of normal. She is not established with a  cardiologist. She was actually in the waiting room of her PCP and wanted to discuss the results with them as well.   She also stated that the inhaler she is using is not helping. She was prescribed Advair from our office but was switched to Breo 200 (there is no documentation of [place] switching her to [facility]). The inhaler works well in the mornings but in the afternoon, she feels like she hasn't used anything. She is also using the Flonase.   She also wants to know if she needs to make a sooner appt. She was advised to follow up in August but feels that is too far away.   Dr. Silas Flood, please advise. Thanks!

## 2021-03-29 NOTE — Telephone Encounter (Signed)
CT scan is improved from prior, but does show signs of mild damage left over from COVID infection earlier in 2022. It is possible with time some of the damage will continue to improve. However, there will likely be some scarring left over once the healing is finished.

## 2021-03-29 NOTE — Telephone Encounter (Signed)
We can escalate the Breo to Trelegy 1 puff daily (stop Breo). I am happy to see her sooner but would advise giving the inhaler a few weeks before we meet. Mid to late July would be fine.

## 2021-03-30 NOTE — Telephone Encounter (Signed)
Called and spoke to pt. Informed pt of the change. Rx has already been sent to pharmacy. Appt scheduled with Dr. Silas Flood on 7/26. Pt verbalized understanding and denied any further questions or concerns at this time.

## 2021-05-02 ENCOUNTER — Other Ambulatory Visit: Payer: Self-pay

## 2021-05-02 ENCOUNTER — Ambulatory Visit (INDEPENDENT_AMBULATORY_CARE_PROVIDER_SITE_OTHER): Payer: 59 | Admitting: Pulmonary Disease

## 2021-05-02 ENCOUNTER — Encounter: Payer: Self-pay | Admitting: Pulmonary Disease

## 2021-05-02 VITALS — BP 120/80 | HR 99 | Ht 62.0 in | Wt 142.4 lb

## 2021-05-02 DIAGNOSIS — J8410 Pulmonary fibrosis, unspecified: Secondary | ICD-10-CM | POA: Diagnosis not present

## 2021-05-02 DIAGNOSIS — R0981 Nasal congestion: Secondary | ICD-10-CM | POA: Diagnosis not present

## 2021-05-02 MED ORDER — CETIRIZINE HCL 10 MG PO TABS: 10.0000 mg | ORAL_TABLET | Freq: Every day | ORAL | 11 refills | Status: DC

## 2021-05-02 MED ORDER — TRELEGY ELLIPTA 200-62.5-25 MCG/INH IN AEPB: 1.0000 | INHALATION_SPRAY | Freq: Every day | RESPIRATORY_TRACT | 3 refills | Status: DC

## 2021-05-02 MED ORDER — AZELASTINE HCL 0.1 % NA SOLN: 1.0000 | Freq: Two times a day (BID) | NASAL | 12 refills | Status: DC

## 2021-05-02 NOTE — Patient Instructions (Signed)
I am glad that the Trelegy seems to help.  Continue to use this once daily.  Rinse your mouth out after every use.  For the nasal congestion, use azelastine spray 1 spray each nostril twice a day.  Take Zyrtec at night.  Zyrtec may be cheaper over-the-counter, check prices at the pharmacy.  Will be to get in a few months to see if your breathing is any better.  The changes on the CT scan are related to COVID.  My hope is this continues to heals and your symptoms improved.  It is possible that it can leave behind some scar as it heals.  Return to clinic in 6 months or sooner as needed with Dr. Silas Flood

## 2021-05-02 NOTE — Progress Notes (Signed)
$'@Patient'b$  ID: Pamela Potter, female    DOB: 1966/04/02, 55 y.o.   MRN: [id number]  Chief Complaint  Patient presents with   Shortness of Breath     Trelegy Inhaler working ok. Still have nasal in nose and feeling nasal congestion.    Referring provider: [name], [name]  HPI:   55 year old whom we are seeing in follow-up for evaluation of dyspnea on exertion and cough since COVID-19 infection 09/2020.    She is doing okay.  Trial of Breo for a few weeks without improvement.  Switch to Trelegy about 4 weeks ago.  This has helped her get more breath.Get deeper breaths.  Has ongoing nasal congestion.  Flonase seem to make her feel worse, felt she cannot get a deep breath, stop breathing for few seconds with pickups.  She stopped this and this is improved.  Was taking montelukast but felt like this caused mucus to be too thick and difficult to expel.  She has ongoing cough.  Occasionally productive of thick mucus.  Seems like mucus worse in the mornings.  Still has residual dyspnea exertion but somewhat improved with Trelegy as above.  Reviewed her PFTs which demonstrated concerning findings of restriction with reduced DLCO.  This prompted CT high-res 03/2021.  This demonstrated on my interpretation scattered groundglass opacities with mild bronchiectasis scattered throughout concerning for ongoing inflammation from COVID-19 with the presence of bronchiectasis developing fibrosis although there is no frank fibrosis demonstrated on the scan.   HPI at initial visit Patient contracted COVID 09/2020.  She received antibody infusion.  She had COVID-vaccine x3 prior to infection.  Symptoms were nasal congestion, sore throat.  Mild dyspnea.  Though symptoms gradually improved except congestion in the nasal passages has persisted.  Dyspnea exertion has worsened.  Dyspnea worse up inclines or stairs.  Can be present on flat surfaces as well.  No timing during day when things are better or worse.  No  seasonal or environmental changes make things better or worse.  She did receive prednisone a few days ago and this seemed to help somewhat.  Similarly, no rhyme or reason to her cough.  No relieving or exacerbating factors that she can readily identify.  No timing during day were things are better or worse.  No seasonal or environmental changes.  With the symptoms she went to the ED 11/2020 and had a CTA chest PE protocol performed which on my interpretation reveals no evidence of PE, scattered faint groundglass opacities bilaterally throughout all lobes consistent with atypical pneumonia versus resolving COVID-19 pneumonia.  She was given doxycycline.  Per neurology notes 12/2020 symptoms improved quite a bit with this intervention.  However patient does not recall this and states nothing is really helped.  PMH: Hypertension, MS Surgical history: Reviewed reviewed with patient, she denies surgeries Family history: Reviewed with patient, she denies respiratory illnesses in first-degree relatives Social history: Never smoker, lives with husband, lives in [place] / Pulmonary Flowsheets:   ACT:  No flowsheet data found.  MMRC: No flowsheet data found.  Epworth:  No flowsheet data found.  Tests:   FENO:  No results found for: NITRICOXIDE  PFT: PFT Results Latest Ref Rng & Units 02/20/2021  FVC-Pre L 2.35  FVC-Predicted Pre % 90  FVC-Post L 2.34  FVC-Predicted Post % 90  Pre FEV1/FVC % % 87  Post FEV1/FCV % % 88  FEV1-Pre L 2.03  FEV1-Predicted Pre % 99  FEV1-Post L 2.06  DLCO uncorrected ml/min/mmHg 9.65  DLCO UNC% % 49  DLCO corrected ml/min/mmHg 9.65  DLCO COR %Predicted % 49  DLVA Predicted % 68  TLC L 3.12  TLC % Predicted % 64  RV % Predicted % 48  Personally reviewed and interpreted as normal spirometry with moderate restriction based on TLC with severely reduced DLCO.  WALK:  No flowsheet data found.  Imaging: Personally reviewed and as per EMR  discussion this note  Lab Results: Personally reviewed, no significant elevation of eosinophils and no anemia CBC    Component Value Date/Time   WBC 4.4 07/11/2020 1343   RBC 5.10 07/11/2020 1343   HGB 13.4 07/11/2020 1343   HCT 42.3 07/11/2020 1343   PLT 352 07/11/2020 1343   MCV 83 07/11/2020 1343   MCH 26.3 (L) 07/11/2020 1343   MCHC 31.7 07/11/2020 1343   RDW 15.0 07/11/2020 1343   LYMPHSABS 1.6 07/11/2020 1343   EOSABS 0.2 07/11/2020 1343   BASOSABS 0.0 07/11/2020 1343    BMET    Component Value Date/Time   NA 138 07/11/2020 1343   K 4.2 07/11/2020 1343   CL 99 07/11/2020 1343   CO2 24 07/11/2020 1343   GLUCOSE 86 07/11/2020 1343   BUN 21 07/11/2020 1343   CREATININE 1.04 (H) 07/11/2020 1343   CALCIUM 10.0 07/11/2020 1343   GFRNONAA 61 07/11/2020 1343   GFRAA 70 07/11/2020 1343    BNP No results found for: BNP  ProBNP No results found for: PROBNP  Specialty Problems   None  Allergies  Allergen Reactions   Ace Inhibitors Swelling    lips   Sulfa Antibiotics Hives and Itching   Amoxicillin Diarrhea   Benadryl [Diphenhydramine] Other (See Comments)    Difficulty swallowing   Dilantin [Phenytoin Sodium Extended]    Lisinopril    Norvasc [Amlodipine Besylate]      There is no immunization history on file for this patient.  Past Medical History:  Diagnosis Date   Anemia    Hypertension    Multiple sclerosis ([place])    Neuromuscular disorder ([place])    Vertigo    Vision abnormalities     Tobacco History: Social History   Tobacco Use  Smoking Status Never  Smokeless Tobacco Never   Counseling given: Not Answered   Continue to not smoke  Outpatient Encounter Medications as of 05/02/2021  Medication Sig   albuterol (VENTOLIN HFA) 108 (90 Base) MCG/ACT inhaler Inhale 1 puff into the lungs every 4 (four) hours.   azelastine (ASTELIN) 0.1 % nasal spray Place 1 spray into both nostrils 2 (two) times daily. Use in each nostril as directed    carvedilol (COREG) 6.25 MG tablet Take 6.25 mg by mouth 2 (two) times daily with a meal.   cetirizine (ZYRTEC ALLERGY) 10 MG tablet Take 1 tablet (10 mg total) by mouth daily.   Fluticasone-Umeclidin-Vilant (TRELEGY ELLIPTA) 200-62.5-25 MCG/INH AEPB Inhale 1 puff into the lungs daily.   levothyroxine (SYNTHROID, LEVOTHROID) 50 MCG tablet Take 50 mcg by mouth daily before breakfast.   ocrelizumab 600 mg in sodium chloride 0.9 % 500 mL Inject 600 mg into the vein every 6 (six) months.   prednisoLONE acetate (PRED MILD) 0.12 % ophthalmic suspension 1 drop 4 (four) times daily.   timolol (BETIMOL) 0.5 % ophthalmic solution Place 1 drop into the right eye daily.   triamterene-hydrochlorothiazide (DYAZIDE) 37.5-25 MG capsule Take 1 capsule by mouth daily.   [DISCONTINUED] fluticasone (FLONASE) 50 MCG/ACT nasal spray Place 1 spray into both nostrils daily.   [  DISCONTINUED] montelukast (SINGULAIR) 10 MG tablet Take 1 tablet (10 mg total) by mouth at bedtime. (Patient not taking: Reported on 05/02/2021)   No facility-administered encounter medications on file as of 05/02/2021.     Review of Systems  Review of Systems  N/a Physical Exam  BP 120/80   Pulse 99   Ht '5\' 2"'$  (1.575 m)   Wt 142 lb 6.4 oz (64.6 kg)   LMP 01/01/2017 (Exact Date)   SpO2 98%   BMI 26.05 kg/m   Wt Readings from Last 5 Encounters:  05/02/21 142 lb 6.4 oz (64.6 kg)  02/07/21 142 lb 12.8 oz (64.8 kg)  12/07/20 144 lb (65.3 kg)  11/27/20 144 lb (65.3 kg)  07/11/20 152 lb 8 oz (69.2 kg)    BMI Readings from Last 5 Encounters:  05/02/21 26.05 kg/m  02/07/21 25.30 kg/m  12/07/20 25.51 kg/m  11/27/20 25.51 kg/m  07/11/20 27.01 kg/m     Physical Exam General: Well-appearing, no acute distress Eyes: EOMI, no icterus Neck: Supple no JVP Cardiovascular: Regular rate and rhythm, no murmur Pulmonary: Clear to oscillation bilaterally, no wheeze or crackle, normal for breathing Abdomen: Nondistended, bowel sounds  present MSK: No synovitis, no joint effusion Neuro: Normal gait, no weakness Psych: Normal mood, full affect   Assessment & Plan:   Cough: Likely multifactorial related to nasal congestion new since COVID infection.  Also concern for asthma as below.  Did not improve the montelukast.  Flonase with side effects.  Trial Zyrtec and azelastine spray.  Dyspnea on exertion: Multifactorial related to prior COVID infection and likely asthma.  Possible deconditioning.  CT scan with areas of groundglass and bronchiectasis that is concerning for development of fibrosis in my opinion.  Presumed asthma: Triggered by COVID-19 infection.  Breo without much improvement.  Trelegy did yield improvement in breathing and dyspnea on exertion. To continue trelegy.   Return in about 6 months (around 11/02/2021).   Lanier Clam, MD 05/02/2021

## 2021-06-13 NOTE — Progress Notes (Addendum)
Chief Complaint  Patient presents with   Follow-up    New room, alone. Last seen 12/07/20.  Got covid last December. Got scarring on lungs/still trying to recover from this. Having continued breathing issues. Follows w/ pulmonologist. She is always tired. Still having issues w/ L knee. Had injection 05/22/21 ([facility] did inj). Trying to get her approved for another injection.      HISTORY OF PRESENT ILLNESS: 06/14/21 ALL:  Pamela Potter is a 55 y.o. female here today for follow up for RRMS. She continues Ocrevus infusions. Last infusion in April, next scheduled in October. Last brain and cervical MRI were stable 02/2020.   She feels that she is fairly stable from last visit in 12/2020. She continues to have difficulty with shortness of breath s/p Covid in 09/2020. She is following closely with pulmonology and reports recently being started on Trelegy that seems to be helping. She continues to have left knee pain. She had a cortisone injeciton that helped some but it continues to ache. She is hoping to be approved for gel injections. Gait is fairly stable. She can walk about 1/2 mile before knee starts hurting. She has not been able to exercise recently due to knee pain. She does not use assistive device. No falls.   She is sleeping really well. She feels that she is now able to get to sleep much easier. She always feels really tired. She feels that she never gets enough sleep. She does not wake refreshed. She is not sure if she snores. She feels that she stops breathing at night if she sleeps on her back. She goes to bed around 12am, wakes at 6 to let dog out then sleeps until 11am. She feels that once she is able to get up and move around she feels ok. She does not usually need to take naps during the day. Mom has OSA.   Mood is good. She denies depression.   Bowel/bladder habits are unchanged. She denies urinary frequency but does have some urgency. She wears a pad. She is not interested  in medicaitons to help with urgency.   She continues vitamin D supplements, 2000iu, OTC.   HISTORY (copied from Dr Garth Bigness previous note)  12/07/2020 She is on Ocrevus.  She feels her MS is doing ok and has no recent exacerbation or new MS symptoms.   She tolerates Ocrevus well.      She had Covid-25 September 2020.  She had her vaccinations and a booster.  She just had mild symptoms (stuffynose cough and HA).    She was treated with the RegeneronShe was experiencing chest discomfort in the sternum a month later that worsened with eating or coughing.   She was placed on a steroid pack and doxycycline after CT showed opacities (atelectasis vs. Atypical infection).   She feels better since starting.   She is starting to feel better but is not baseline.   Of note despite the vaccination and booster, her SARS-CoV2 IgG was negative 07/2020.      Gait is ok but she fatigues easily with activities.  She can go about 1/2 to 1 mile without break but may stumble more after she walks longer distance.   She does worse in heat. She needs to hold the bannister on stairs.    She feels her right leg does worse than her left leg.   She does note some left knee pain..   She has urinary urgency and occasional incontinence and  wears pads.  She prefers not to be on a bladder medication.   She also gets frequent diarrhea, especially if nervous.  Imodium or kaopectate can help.   Vision has done well.      She notes more fatigue since Covid-19.     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  She is often hot at night and has trouble getting comfortable.  She twists a lot at night even while asleep.     She denies depression.  She notes verbal fluency issues and is forgetful.     She takes Vit D supplements 2000 U  daily .   Last level was 30.7.   IgG and IgM were fine 07/11/20.    MS History: She was diagnosed with MS in 1991 after presenting with gait disturbance, weakness and vertigo. An MRI of the brain  showed plaques and a lumbar puncture were reportedly consistent with MS. She saw Dr. Adolphus Birchwood a couple years later and was started her on Avonex.   She stayed on Avonex for about 10 years but then had new lesions on her MRI. She was referred to Dr. Jacqulynn Cadet. He placed her on Tysabri and she stayed on for a few years. 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 Unfortunately, on Gilenya, she had some MRI progression and Tysabri was restarted. . Of note, her JCV antibody titers were initially less than 0.9. However, over the last couple years her titers have increased and her last JCV antibody was 1.03. She was switched to ocrelizumab and had her first dose the end of June 2017 and her second dose in mid July. She has not noted any exacerbations while on ocrelizumab.   DATA: MRI images from 02/10/2016 show classic T2/FLAIR hyperintense foci in the periventricular, juxtacortical and deep white matter of both hemispheres and also in the pons and right middle cerebellar peduncle in a pattern and configuration consistent with chronic demyelinating plaque associated with MS.   There were no acute findings and no change when compared to the previous MRI, from 05/17/2014   The MRI of the cervical and thoracic spine 2017 showed foci in the upper cervical spine and in the mid to lower thoracic spine. None of these were acute.       I reviewed her labs. Her JCV antibody on 12/25/2015 was 1.03 (middle positive). She had normal CD4 and CD8 cells.   Hepatitis B labs and Quantiferon TB test were negative or nonreactive.   MRI 02/12/2020 cervical spine:  Patchy T2 hyperintense foci within the upper cervical spine as described above.  None of the foci appear to be acute and there are no definite new lesions compared to the previous MRI from 2015.   Small left paramedian disc protrusion at C6-C7, not present on the 2015 MRI, that does not lead to nerve root  compression or spinal stenosis   MRI 02/12/2020 Brain:  Multiple T2/FLAIR hyperintense foci in the brainstem, middle cerebellar peduncles and hemispheres in a pattern and configuration consistent with chronic demyelinating plaque associated with multiple sclerosis.  None of the foci appear to be acute.  Compared to the MRI from 01/08/2018, there are no new lesions.   REVIEW OF SYSTEMS: Out of a complete 14 system review of symptoms, the patient complains only of the following symptoms, fatigue, shortness of breath, knee pain, and all other reviewed systems are negative.   ALLERGIES: Allergies  Allergen Reactions   Ace Inhibitors Swelling    lips   Sulfa Antibiotics Hives and Itching   Amoxicillin Diarrhea   Benadryl [Diphenhydramine] Other (See Comments)    Difficulty swallowing   Dilantin [Phenytoin Sodium Extended]    Lisinopril    Norvasc [Amlodipine Besylate]      HOME MEDICATIONS: Outpatient Medications Prior to Visit  Medication Sig Dispense Refill   albuterol (VENTOLIN HFA) 108 (90 Base) MCG/ACT inhaler Inhale 1 puff into the lungs every 4 (four) hours.     azelastine (ASTELIN) 0.1 % nasal spray Place 1 spray into both nostrils 2 (two) times daily. Use in each nostril as directed 30 mL 12   carvedilol (COREG) 6.25 MG tablet Take 6.25 mg by mouth 2 (two) times daily with a meal.     cetirizine (ZYRTEC ALLERGY) 10 MG tablet Take 1 tablet (10 mg total) by mouth daily. 30 tablet 11   Fluticasone-Umeclidin-Vilant (TRELEGY ELLIPTA) 200-62.5-25 MCG/INH AEPB Inhale 1 puff into the lungs daily. 60 each 11   levothyroxine (SYNTHROID, LEVOTHROID) 50 MCG tablet Take 50 mcg by mouth daily before breakfast.     ocrelizumab 600 mg in sodium chloride 0.9 % 500 mL Inject 600 mg into the vein every 6 (six) months.     prednisoLONE acetate (PRED MILD) 0.12 % ophthalmic suspension 1 drop 4 (four) times daily.     timolol (BETIMOL) 0.5 % ophthalmic solution Place 1 drop into the right eye daily.      triamterene-hydrochlorothiazide (DYAZIDE) 37.5-25 MG capsule Take 1 capsule by mouth as needed.     No facility-administered medications prior to visit.     PAST MEDICAL HISTORY: Past Medical History:  Diagnosis Date   Anemia    Hypertension    Multiple sclerosis ([place])    Neuromuscular disorder ([place])    Vertigo    Vision abnormalities      PAST SURGICAL HISTORY: Past Surgical History:  Procedure Laterality Date   EYE SURGERY       FAMILY HISTORY: Family History  Problem Relation Age of Onset   Hyperlipidemia Mother    Hypertension Mother    Stroke Father    Diabetes Father      SOCIAL HISTORY: Social History   Socioeconomic History   Marital status: Married    Spouse name: Not on file   Number of children: Not on file   Years of education: Not on file   Highest education level: Not on file  Occupational History   Not on file  Tobacco Use   Smoking status: Never   Smokeless tobacco: Never  Substance and Sexual Activity   Alcohol use: Never    Alcohol/week: 0.0 standard drinks   Drug use: Never   Sexual activity: Not on file  Other Topics Concern   Not on file  Social History Narrative   Not on file   Social Determinants of Health   Financial Resource Strain: Not on file  Food Insecurity: Not on file  Transportation Needs: Not on file  Physical Activity: Not on file  Stress: Not on file  Social Connections: Not on file  Intimate Partner Violence: Not on file     PHYSICAL EXAM  Vitals:   06/14/21 1025  BP: 122/84  Pulse: (!) 102  SpO2: 97%  Weight: 141 lb 12.8 oz (64.3 kg)  Height: '5\' 2"'$  (1.575 m)   Body mass index is 25.94 kg/m.   Generalized: Well developed, in no acute distress  Cardiology: normal rate and rhythm,  no murmur auscultated  Respiratory: clear to auscultation bilaterally    Neurological examination  Mentation: Alert oriented to time, place, history taking. Follows all commands speech and language fluent Cranial  nerve II-XII: Pupils were equal round reactive to light. Extraocular movements were full, visual field were full on confrontational test. Facial sensation and strength were normal. Head turning and shoulder shrug  were normal and symmetric. Motor: The motor testing reveals 5 over 5 strength of all 4 extremities with exception of 4/5 right foot.  Sensory: Sensory testing is intact to soft touch on all 4 extremities. No evidence of extinction is noted.  Gait and station: Station normal. Gait is slightly wide.     DIAGNOSTIC DATA (LABS, IMAGING, TESTING) - I reviewed patient records, labs, notes, testing and imaging myself where available.  Lab Results  Component Value Date   WBC 4.4 07/11/2020   HGB 13.4 07/11/2020   HCT 42.3 07/11/2020   MCV 83 07/11/2020   PLT 352 07/11/2020      Component Value Date/Time   NA 138 07/11/2020 1343   K 4.2 07/11/2020 1343   CL 99 07/11/2020 1343   CO2 24 07/11/2020 1343   GLUCOSE 86 07/11/2020 1343   BUN 21 07/11/2020 1343   CREATININE 1.04 (H) 07/11/2020 1343   CALCIUM 10.0 07/11/2020 1343   PROT 6.9 07/11/2020 1343   ALBUMIN 4.6 07/11/2020 1343   AST 14 07/11/2020 1343   ALT 11 07/11/2020 1343   ALKPHOS 178 (H) 07/11/2020 1343   BILITOT 0.4 07/11/2020 1343   GFRNONAA 61 07/11/2020 1343   GFRAA 70 07/11/2020 1343   No results found for: CHOL, HDL, LDLCALC, LDLDIRECT, TRIG, CHOLHDL Lab Results  Component Value Date   HGBA1C 5.8 (H) 07/11/2020   No results found for: [id number] Lab Results  Component Value Date   TSH 1.250 07/11/2020    No flowsheet data found.   No flowsheet data found.   ASSESSMENT AND PLAN  56 y.o. year old female  has a past medical history of Anemia, Hypertension, Multiple sclerosis ([place]), Neuromuscular disorder ([place]), Vertigo, and Vision abnormalities. here with    Relapsing remitting multiple sclerosis ([place]) - Plan: IgG, IgA, IgM, CBC with Differential/Platelets  High risk medication use - Plan: IgG,  IgA, IgM, CBC with Differential/Platelets  Vitamin D deficiency - Plan: Vitamin D, 25-hydroxy  Gait disturbance  Urge incontinence of urine  Other fatigue  [place] feels that MS symptoms are stable, overall. She will continue Ocrevus infusions ever 6 months. We will update labs, today. She will continue focusing on healthy lifestyle habits. We have discussed concerns of fatigue and possible apneic events when she sleeps on her back. We discussed HST but EDS 5/24. I have encouraged her to avoid sleeping on her back. She may call if she wishes to pursue sleep apnea testing. Regular physical and mental activity advised. She will follow up with Dr Felecia Shelling in 6 months.    Orders Placed This Encounter  Procedures   IgG, IgA, IgM   Vitamin D, 25-hydroxy   CBC with Differential/Platelets      No orders of the defined types were placed in this encounter.     Debbora Presto, MSN, FNP-C 06/14/2021, 12:14 PM  [facility] [address], [place], [place] [address] [phone]   I have read the note, and I agree with the clinical assessment and plan.  Richard A. Felecia Shelling, MD, PhD, FAAN Certified in Neurology, Clinical Neurophysiology, Sleep Medicine, Pain Medicine and  Neuroimaging  [facility] [address] [place] [employer] [place], [place] [address] [phone]

## 2021-06-13 NOTE — Patient Instructions (Signed)
Below is our plan:  We will continue Ocrevus infusions every 6 months. I will update labs, today. Continue vitamin D supplements OTC.   Please make sure you are staying well hydrated. I recommend 50-60 ounces daily. Well balanced diet and regular exercise encouraged. Consistent sleep schedule with 6-8 hours recommended.   Please continue follow up with care team as directed.   Follow up with Dr Felecia Shelling in 6 months   You may receive a survey regarding today's visit. I encourage you to leave honest feed back as I do use this information to improve patient care. Thank you for seeing me today!

## 2021-06-14 ENCOUNTER — Ambulatory Visit: Payer: 59 | Admitting: Family Medicine

## 2021-06-14 ENCOUNTER — Encounter: Payer: Self-pay | Admitting: Family Medicine

## 2021-06-14 ENCOUNTER — Other Ambulatory Visit: Payer: Self-pay

## 2021-06-14 VITALS — BP 122/84 | HR 102 | Ht 62.0 in | Wt 141.8 lb

## 2021-06-14 DIAGNOSIS — Z79899 Other long term (current) drug therapy: Secondary | ICD-10-CM | POA: Diagnosis not present

## 2021-06-14 DIAGNOSIS — E559 Vitamin D deficiency, unspecified: Secondary | ICD-10-CM

## 2021-06-14 DIAGNOSIS — G35 Multiple sclerosis: Secondary | ICD-10-CM

## 2021-06-14 DIAGNOSIS — N3941 Urge incontinence: Secondary | ICD-10-CM

## 2021-06-14 DIAGNOSIS — R269 Unspecified abnormalities of gait and mobility: Secondary | ICD-10-CM

## 2021-06-14 DIAGNOSIS — R5383 Other fatigue: Secondary | ICD-10-CM

## 2021-06-15 ENCOUNTER — Telehealth: Payer: Self-pay | Admitting: *Deleted

## 2021-06-15 LAB — CBC WITH DIFFERENTIAL/PLATELET
Basophils Absolute: 0 10*3/uL (ref 0.0–0.2)
Basos: 0 %
EOS (ABSOLUTE): 0.1 10*3/uL (ref 0.0–0.4)
Eos: 2 %
Hematocrit: 34.7 % (ref 34.0–46.6)
Hemoglobin: 11 g/dL — ABNORMAL LOW (ref 11.1–15.9)
Immature Grans (Abs): 0 10*3/uL (ref 0.0–0.1)
Immature Granulocytes: 0 %
Lymphocytes Absolute: 0.9 10*3/uL (ref 0.7–3.1)
Lymphs: 16 %
MCH: 24.3 pg — ABNORMAL LOW (ref 26.6–33.0)
MCHC: 31.7 g/dL (ref 31.5–35.7)
MCV: 77 fL — ABNORMAL LOW (ref 79–97)
Monocytes Absolute: 0.4 10*3/uL (ref 0.1–0.9)
Monocytes: 7 %
Neutrophils Absolute: 4.3 10*3/uL (ref 1.4–7.0)
Neutrophils: 75 %
Platelets: 390 10*3/uL (ref 150–450)
RBC: 4.53 x10E6/uL (ref 3.77–5.28)
RDW: 16.5 % — ABNORMAL HIGH (ref 11.7–15.4)
WBC: 5.7 10*3/uL (ref 3.4–10.8)

## 2021-06-15 LAB — IGG, IGA, IGM
IgA/Immunoglobulin A, Serum: 75 mg/dL — ABNORMAL LOW (ref 87–352)
IgG (Immunoglobin G), Serum: 745 mg/dL (ref 586–1602)
IgM (Immunoglobulin M), Srm: 59 mg/dL (ref 26–217)

## 2021-06-15 LAB — VITAMIN D 25 HYDROXY (VIT D DEFICIENCY, FRACTURES): Vit D, 25-Hydroxy: 45.5 ng/mL (ref 30.0–100.0)

## 2021-06-15 NOTE — Telephone Encounter (Signed)
I spoke to the patient. She verbalized understanding of her lab findings. She was in agreement to follow up w/ her PCP for her decreased hemoglobin.  She also wanted to know if she should get the new covid booster. Per vo by Dr. Felecia Shelling, it is okay for her to get the injection. She is aware of his response.

## 2021-06-15 NOTE — Telephone Encounter (Signed)
-----   Message from Debbora Presto, NP sent at 06/15/2021  7:14 AM EDT ----- Please let her know that her labs look stable from [place] standpoint. Her Hg has dropped from a year ago. Although it is not overly worrisome, I would like for her to keep a close eye on this. Labs look like she could be developing an anemia. I would have her follow up with PCP in 1-2 months to recheck CBC and check for iron deficiency anemia if needed. Otherwise, she can continue current treatment plan for MS.

## 2021-08-03 DIAGNOSIS — Z0289 Encounter for other administrative examinations: Secondary | ICD-10-CM

## 2021-08-08 ENCOUNTER — Telehealth: Payer: Self-pay | Admitting: *Deleted

## 2021-08-08 NOTE — Telephone Encounter (Signed)
Pt [place] form faxed on 08/03/2021 [phone]

## 2021-08-16 IMAGING — CT CT CHEST HIGH RESOLUTION W/O CM
1 of 4 series · 15 of 30 positions shown, 19 images · non-contrast
Comparison: 11/24/2020.

CLINICAL DATA: Abnormal pulmonary function tests, post COVID
shortness of breath on exertion. Congestion chest tightness.

EXAM:
CT CHEST WITHOUT CONTRAST
TECHNIQUE: Multidetector CT imaging of the chest was performed following the
standard protocol without intravenous contrast. High resolution
imaging of the lungs, as well as inspiratory and expiratory imaging,
was performed.

[Series 2: chest · axial · 0.63mm/px · z∈[-392,-176]mm · 15 of 121 slices shown, 19 images]
[im 7/121  mediastinal]
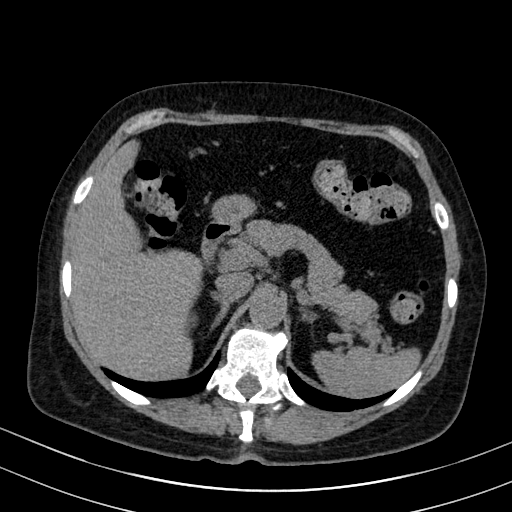
[im 7/121  lung]
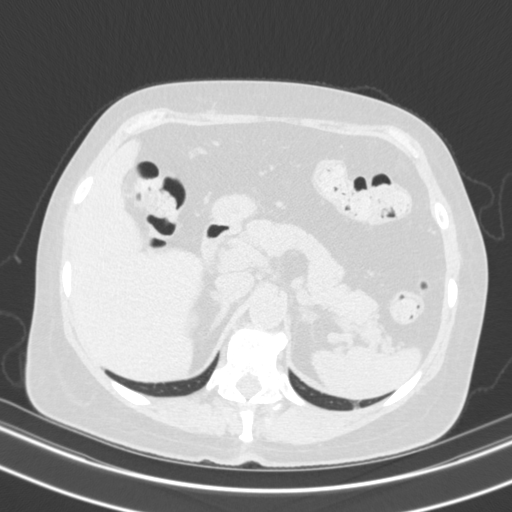
[im 19/121  lung]
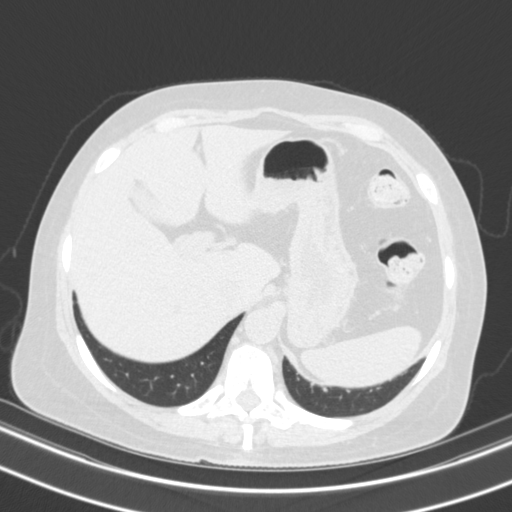
[im 25/121  lung]
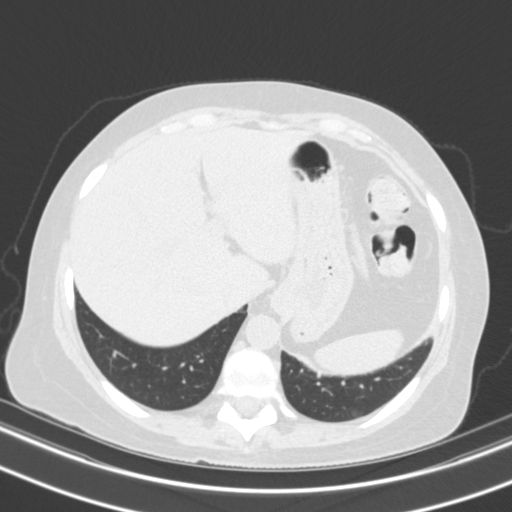
[im 31/121  lung]
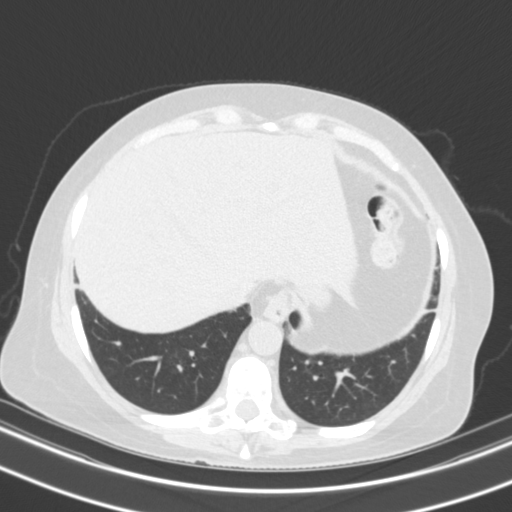
[im 43/121  mediastinal]
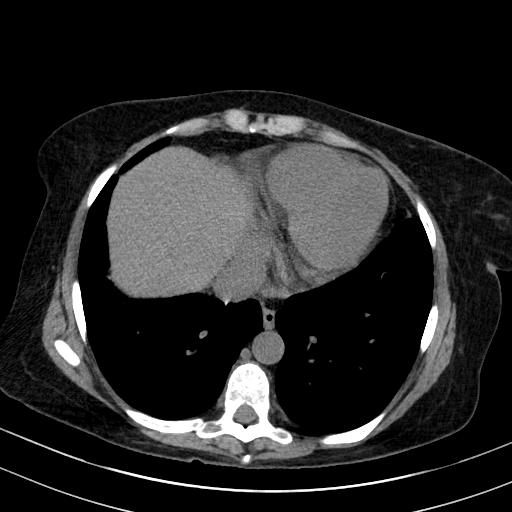
[im 43/121  lung]
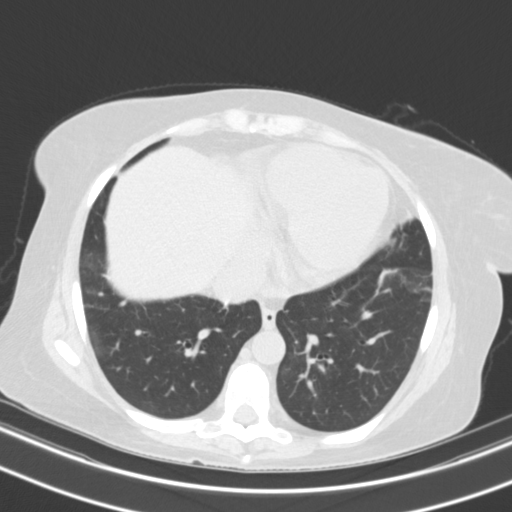
[im 49/121  lung]
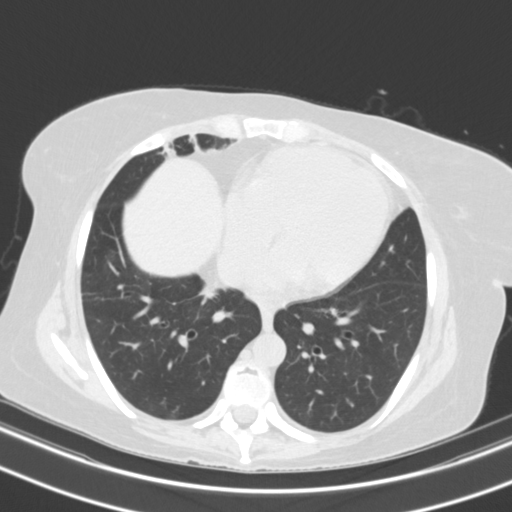
[im 55/121  lung]
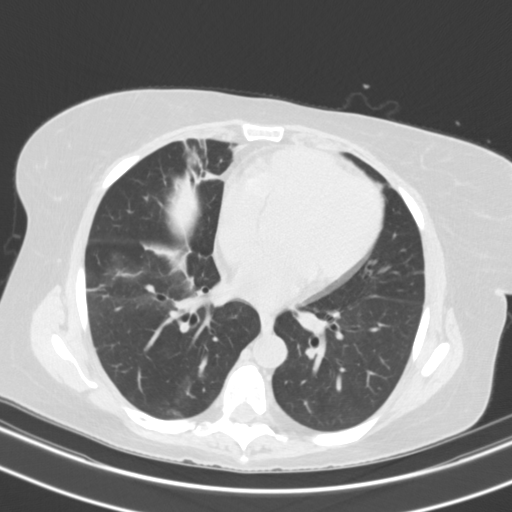
[im 61/121  lung]
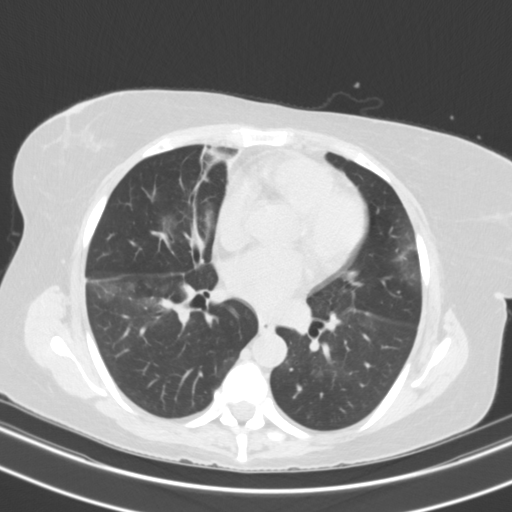
[im 67/121  mediastinal]
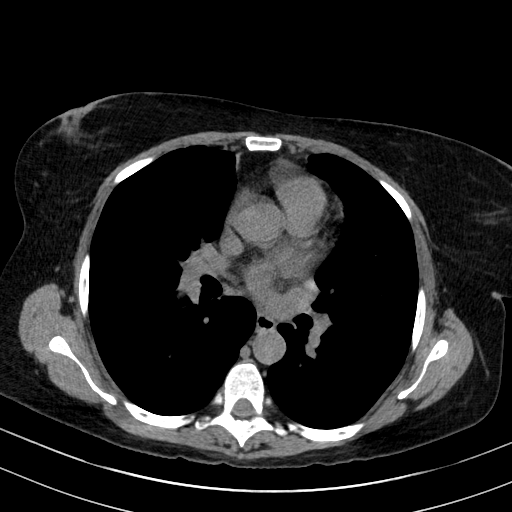
[im 67/121  lung]
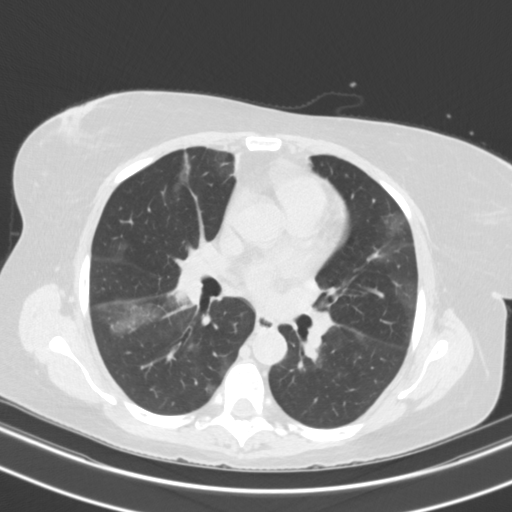
[im 73/121  lung]
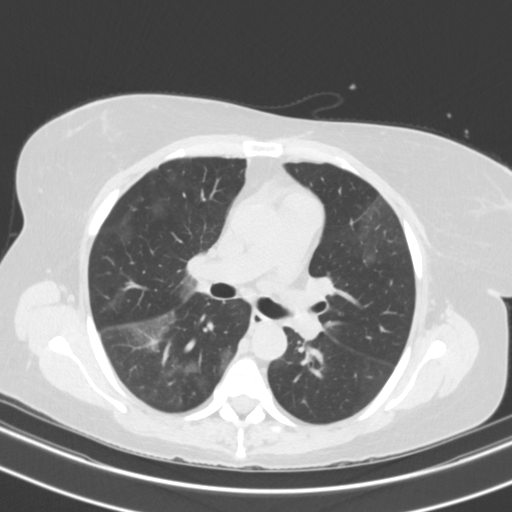
[im 85/121  lung]
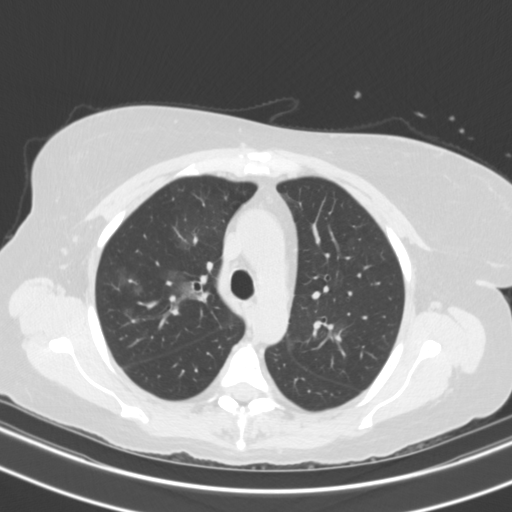
[im 91/121  lung]
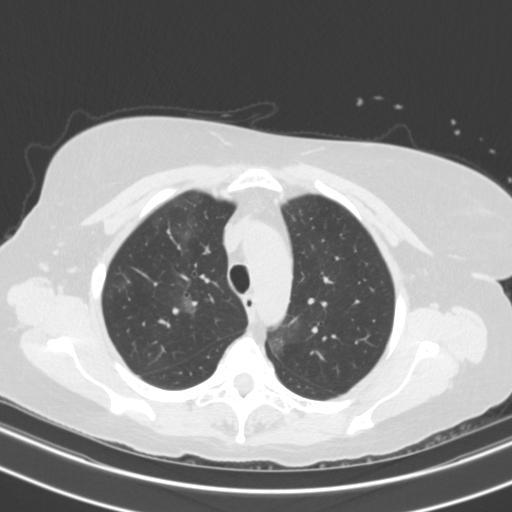
[im 97/121  mediastinal]
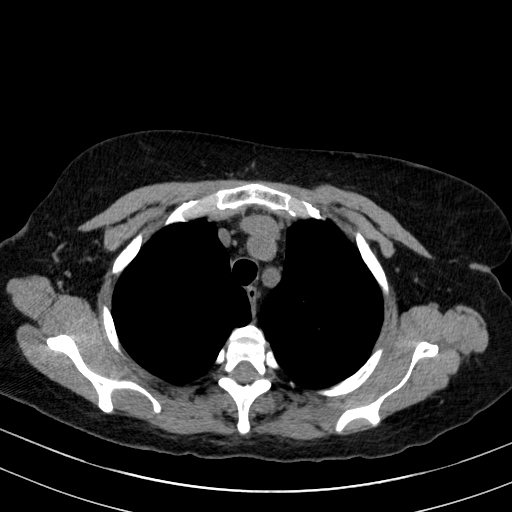
[im 97/121  lung]
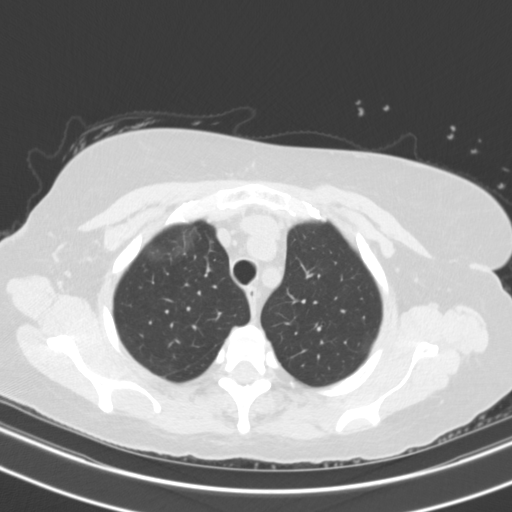
[im 109/121  lung]
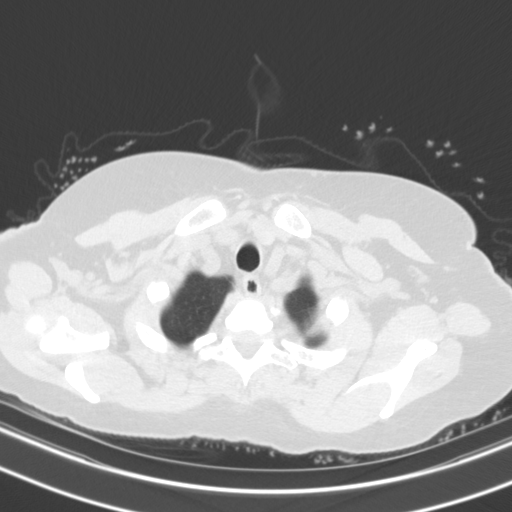
[im 115/121  lung]
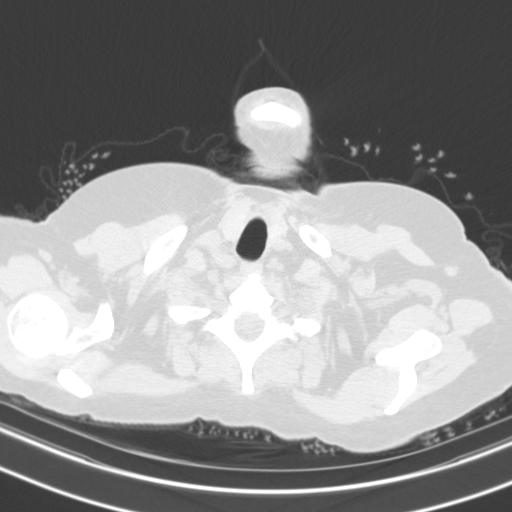

[15 of 30 positions shown; findings below may reference images not displayed]

FINDINGS: Cardiovascular: Heart is at the upper limits of normal in size to
mildly enlarged. No pericardial effusion.

Mediastinum/Nodes: No pathologically enlarged mediastinal or
axillary lymph nodes. Hilar regions are difficult to definitively
evaluate without IV contrast. Esophagus is grossly unremarkable.

Lungs/Pleura: There are patchy areas of residual pulmonary
parenchymal ground-glass, overall improved from 11/24/2020. Mild
associated bronchiectasis. No appreciable architectural distortion.
There is air trapping.

Upper Abdomen: Subcentimeter low-attenuation lesions in the liver
are too small to characterize. Visualized portions of the liver,
gallbladder and right adrenal gland are unremarkable. Nodular
thickening of the left adrenal gland. Visualized portions of the
kidneys, spleen, pancreas, stomach and bowel are otherwise
unremarkable.

Musculoskeletal: No worrisome lytic or sclerotic lesions.
IMPRESSION: 1. Pulmonary parenchymal pattern of patchy bilateral ground-glass
and mild bronchiectasis, overall improved from 11/24/2020. Findings
are consistent with the sequelae of [id number] related acute
respiratory distress syndrome.
2. Air trapping is indicative of small airways disease.

## 2021-09-12 ENCOUNTER — Encounter: Payer: Self-pay | Admitting: Pulmonary Disease

## 2021-09-12 ENCOUNTER — Other Ambulatory Visit: Payer: Self-pay

## 2021-09-12 ENCOUNTER — Ambulatory Visit: Payer: 59 | Admitting: Pulmonary Disease

## 2021-09-12 ENCOUNTER — Ambulatory Visit (INDEPENDENT_AMBULATORY_CARE_PROVIDER_SITE_OTHER): Payer: 59

## 2021-09-12 VITALS — BP 120/76 | HR 93 | Temp 98.7°F | Ht 62.0 in | Wt 143.8 lb

## 2021-09-12 DIAGNOSIS — R053 Chronic cough: Secondary | ICD-10-CM

## 2021-09-12 MED ORDER — IPRATROPIUM BROMIDE 0.06 % NA SOLN: 2.0000 | Freq: Two times a day (BID) | NASAL | 12 refills | Status: DC

## 2021-09-12 NOTE — Patient Instructions (Addendum)
Nice to see you again  Continue the Zyrtec and Trelegy as prescribed  I added the new nasal spray, ipratropium.  Use 2 sprays each nostril twice a day.  Lets see if this helps with a tickle in the throat, mucus, sinus congestion.  We will get a chest x-ray today since is been 1 year since your  COVID infection to check on the lungs.  Make sure not missing something else for your cough.  Return to clinic in 3 months or sooner as needed with Dr. Silas Flood

## 2021-09-12 NOTE — Progress Notes (Signed)
@Patient  ID: Pamela Potter, female    DOB: 04-06-1966, 55 y.o.   MRN: [id number]  Chief Complaint  Patient presents with   Follow-up    Pt states her breathing is fine. But she is still struggling with coughing up the mucus.     Referring provider: [name], [name]  HPI:   55 y.o. whom we are seeing in follow-up for evaluation of dyspnea on exertion and cough since COVID-19 infection 09/2020.    Overall, doing okay.  She continues to report good adherence to Trelegy.  This helped her cough and breathing overall.  Dyspnea not too bad.  Still with tickle in throat, need to clear her throat.  Patient productive of phlegm.  Endorses sensation of postnasal drip.  Some mild nasal congestion.   HPI at initial visit Patient contracted COVID 09/2020.  She received antibody infusion.  She had COVID-vaccine x3 prior to infection.  Symptoms were nasal congestion, sore throat.  Mild dyspnea.  Though symptoms gradually improved except congestion in the nasal passages has persisted.  Dyspnea exertion has worsened.  Dyspnea worse up inclines or stairs.  Can be present on flat surfaces as well.  No timing during day when things are better or worse.  No seasonal or environmental changes make things better or worse.  She did receive prednisone a few days ago and this seemed to help somewhat.  Similarly, no rhyme or reason to her cough.  No relieving or exacerbating factors that she can readily identify.  No timing during day were things are better or worse.  No seasonal or environmental changes.  With the symptoms she went to the ED 11/2020 and had a CTA chest PE protocol performed which on my interpretation reveals no evidence of PE, scattered faint groundglass opacities bilaterally throughout all lobes consistent with atypical pneumonia versus resolving COVID-19 pneumonia.  She was given doxycycline.  Per neurology notes 12/2020 symptoms improved quite a bit with this intervention.  However patient does  not recall this and states nothing is really helped.  PMH: Hypertension, MS Surgical history: Reviewed reviewed with patient, she denies surgeries Family history: Reviewed with patient, she denies respiratory illnesses in first-degree relatives Social history: Never smoker, lives with husband, lives in [place] / Pulmonary Flowsheets:   ACT:  No flowsheet data found.  MMRC: No flowsheet data found.  Epworth:  No flowsheet data found.  Tests:   FENO:  No results found for: NITRICOXIDE  PFT: PFT Results Latest Ref Rng & Units 02/20/2021  FVC-Pre L 2.35  FVC-Predicted Pre % 90  FVC-Post L 2.34  FVC-Predicted Post % 90  Pre FEV1/FVC % % 87  Post FEV1/FCV % % 88  FEV1-Pre L 2.03  FEV1-Predicted Pre % 99  FEV1-Post L 2.06  DLCO uncorrected ml/min/mmHg 9.65  DLCO UNC% % 49  DLCO corrected ml/min/mmHg 9.65  DLCO COR %Predicted % 49  DLVA Predicted % 68  TLC L 3.12  TLC % Predicted % 64  RV % Predicted % 48  Personally reviewed and interpreted as normal spirometry with moderate restriction based on TLC with severely reduced DLCO.  WALK:  No flowsheet data found.  Imaging: Personally reviewed and as per EMR discussion this note CT high-res 03/2021.  This demonstrated on my interpretation scattered groundglass opacities with mild bronchiectasis scattered throughout concerning for ongoing inflammation from COVID-19 with the presence of bronchiectasis developing fibrosis although there is no frank fibrosis demonstrated on the scan.  Lab Results: Personally reviewed, no  significant elevation of eosinophils and no anemia CBC    Component Value Date/Time   WBC 5.7 06/14/2021 1056   RBC 4.53 06/14/2021 1056   HGB 11.0 (L) 06/14/2021 1056   HCT 34.7 06/14/2021 1056   PLT 390 06/14/2021 1056   MCV 77 (L) 06/14/2021 1056   MCH 24.3 (L) 06/14/2021 1056   MCHC 31.7 06/14/2021 1056   RDW 16.5 (H) 06/14/2021 1056   LYMPHSABS 0.9 06/14/2021 1056   EOSABS 0.1  06/14/2021 1056   BASOSABS 0.0 06/14/2021 1056    BMET    Component Value Date/Time   NA 138 07/11/2020 1343   K 4.2 07/11/2020 1343   CL 99 07/11/2020 1343   CO2 24 07/11/2020 1343   GLUCOSE 86 07/11/2020 1343   BUN 21 07/11/2020 1343   CREATININE 1.04 (H) 07/11/2020 1343   CALCIUM 10.0 07/11/2020 1343   GFRNONAA 61 07/11/2020 1343   GFRAA 70 07/11/2020 1343    BNP No results found for: BNP  ProBNP No results found for: PROBNP  Specialty Problems   None  Allergies  Allergen Reactions   Ace Inhibitors Swelling    lips   Sulfa Antibiotics Hives and Itching   Amoxicillin Diarrhea   Benadryl [Diphenhydramine] Other (See Comments)    Difficulty swallowing   Dilantin [Phenytoin Sodium Extended]    Lisinopril    Norvasc [Amlodipine Besylate]     Immunization History  Administered Date(s) Administered   Influenza,inj,Quad PF,6+ Mos 08/13/2018   Influenza-Unspecified 08/08/2014, 07/14/2021   Zoster, Live 09/07/2013    Past Medical History:  Diagnosis Date   Anemia    Hypertension    Multiple sclerosis ([place])    Neuromuscular disorder ([place])    Vertigo    Vision abnormalities     Tobacco History: Social History   Tobacco Use  Smoking Status Never  Smokeless Tobacco Never   Counseling given: Not Answered   Continue to not smoke  Outpatient Encounter Medications as of 09/12/2021  Medication Sig   albuterol (VENTOLIN HFA) 108 (90 Base) MCG/ACT inhaler Inhale 1 puff into the lungs every 4 (four) hours.   carvedilol (COREG) 6.25 MG tablet Take 6.25 mg by mouth 2 (two) times daily with a meal.   cetirizine (ZYRTEC ALLERGY) 10 MG tablet Take 1 tablet (10 mg total) by mouth daily.   Fluticasone-Umeclidin-Vilant (TRELEGY ELLIPTA) 200-62.5-25 MCG/INH AEPB Inhale 1 puff into the lungs daily.   ipratropium (ATROVENT) 0.06 % nasal spray Place 2 sprays into both nostrils 2 (two) times daily.   levothyroxine (SYNTHROID, LEVOTHROID) 50 MCG tablet Take 50 mcg by mouth  daily before breakfast.   ocrelizumab 600 mg in sodium chloride 0.9 % 500 mL Inject 600 mg into the vein every 6 (six) months.   prednisoLONE acetate (PRED MILD) 0.12 % ophthalmic suspension 1 drop 4 (four) times daily.   timolol (BETIMOL) 0.5 % ophthalmic solution Place 1 drop into the right eye daily.   triamterene-hydrochlorothiazide (DYAZIDE) 37.5-25 MG capsule Take 1 capsule by mouth as needed.   [DISCONTINUED] azelastine (ASTELIN) 0.1 % nasal spray Place 1 spray into both nostrils 2 (two) times daily. Use in each nostril as directed (Patient not taking: Reported on 09/12/2021)   No facility-administered encounter medications on file as of 09/12/2021.     Review of Systems  Review of Systems  N/a Physical Exam  BP 120/76 (BP Location: Left Arm, Patient Position: Sitting, Cuff Size: Normal)   Pulse 93   Temp 98.7 F (37.1 C) (Oral)  Ht 5\' 2"  (1.575 m)   Wt 143 lb 12.8 oz (65.2 kg)   LMP 01/01/2017 (Exact Date)   SpO2 98%   BMI 26.30 kg/m   Wt Readings from Last 5 Encounters:  09/12/21 143 lb 12.8 oz (65.2 kg)  06/14/21 141 lb 12.8 oz (64.3 kg)  05/02/21 142 lb 6.4 oz (64.6 kg)  02/07/21 142 lb 12.8 oz (64.8 kg)  12/07/20 144 lb (65.3 kg)    BMI Readings from Last 5 Encounters:  09/12/21 26.30 kg/m  06/14/21 25.94 kg/m  05/02/21 26.05 kg/m  02/07/21 25.30 kg/m  12/07/20 25.51 kg/m     Physical Exam General: Well-appearing, no acute distress Eyes: EOMI, no icterus Neck: Supple no JVP Cardiovascular: Regular rate and rhythm, no murmur Pulmonary: Clear to auscultation bilaterally, no wheeze or crackle, normal for breathing Abdomen: Nondistended, bowel sounds present MSK: No synovitis, no joint effusion Neuro: Normal gait, no weakness Psych: Normal mood, full affect   Assessment & Plan:   Cough: Overall improved.  Likely multifactorial but mostly related to nasal congestion new since COVID infection.  Also concern for asthma as below. Zyrtec with some  mild improvement.  Did not tolerate Flonase nor azelastine.  Montelukast not effective.  Trial ipratropium nasal spray given concern for postnasal drip.  Recommend sinus rinses as well if ipratropium not effective.  Dyspnea on exertion: Multifactorial related to prior COVID infection and likely asthma.  Possible deconditioning.  CT scan with areas of groundglass and bronchiectasis that is concerning for development of fibrosis in my opinion.  Overall, dyspnea is relatively minimal.  Presumed asthma: Triggered by COVID-19 infection.  Breo without much improvement.  Trelegy did yield improvement in breathing and dyspnea on exertion. To continue trelegy.   Return in about 3 months (around 12/11/2021).   Lanier Clam, MD 09/12/2021

## 2021-10-08 HISTORY — PX: COLONOSCOPY: SHX174

## 2021-11-17 ENCOUNTER — Telehealth: Payer: Self-pay | Admitting: Pulmonary Disease

## 2021-11-17 NOTE — Telephone Encounter (Signed)
Pt is on trellegy and has been for a long time. She states that she has noticed her blood pressure going up as of late. It has been in the 160-170 range. Does not feel bad when this happenes, but wanted [place] aware. Wants to know if this is normal and if this could be because of taking trellegy. Notices that it is 115-125 prior to taking trellegy during the day. Pt states we may leave detailed VM if she doesn't answer.

## 2021-11-17 NOTE — Telephone Encounter (Signed)
Called and spoke with patient. She verbalized understanding and will contact her PCP.   Nothing further needed at time of call.

## 2021-11-17 NOTE — Telephone Encounter (Signed)
Advise continuing Trelegy. Recommend she have appt with PCP and start or increase BP medications at PCP discretion.

## 2021-11-17 NOTE — Telephone Encounter (Signed)
Called and spoke with patient. She stated that she has noticed that her BP has been elevated since starting the Trelegy last summer. Confirmed that her systolic has been running in the 160-170 range. She assumed it was her BP monitor at home but had it confirmed when she went to her PCP's office for a BP check.   She denied any symptoms such as chest pain or heart palpations.   She would like to stay on the Trelegy if possible but is concerned about her BP.   MH, can you please advise? Thanks!

## 2021-12-13 ENCOUNTER — Other Ambulatory Visit: Payer: Self-pay

## 2021-12-13 ENCOUNTER — Ambulatory Visit: Payer: 59 | Admitting: Neurology

## 2021-12-13 ENCOUNTER — Encounter: Payer: Self-pay | Admitting: Neurology

## 2021-12-13 VITALS — BP 131/89 | HR 85 | Ht 63.5 in | Wt 147.0 lb

## 2021-12-13 DIAGNOSIS — Z79899 Other long term (current) drug therapy: Secondary | ICD-10-CM

## 2021-12-13 DIAGNOSIS — R5383 Other fatigue: Secondary | ICD-10-CM

## 2021-12-13 DIAGNOSIS — G4700 Insomnia, unspecified: Secondary | ICD-10-CM

## 2021-12-13 DIAGNOSIS — N3941 Urge incontinence: Secondary | ICD-10-CM | POA: Diagnosis not present

## 2021-12-13 DIAGNOSIS — M25562 Pain in left knee: Secondary | ICD-10-CM

## 2021-12-13 DIAGNOSIS — G35 Multiple sclerosis: Secondary | ICD-10-CM | POA: Diagnosis not present

## 2021-12-13 DIAGNOSIS — N390 Urinary tract infection, site not specified: Secondary | ICD-10-CM

## 2021-12-13 MED ORDER — TRAZODONE HCL 50 MG PO TABS
50.0000 mg | ORAL_TABLET | Freq: Every day | ORAL | 11 refills | Status: DC
Start: 2021-12-13 — End: 2022-01-10

## 2021-12-13 NOTE — Progress Notes (Signed)
[facility]  PATIENT: Pamela Potter DOB: 29-Jun-1966  REFERRING DOCTOR OR PCP:  Dr. Starleen Blue    Dr. Maude Leriche is PCP [name] SOURCE: patient, records from Dr. Starleen Blue. Laboratory results, MRI results, multiple MRI images on PACS  _________________________________   HISTORICAL  CHIEF COMPLAINT:  Chief Complaint  Patient presents with   Follow-up    Rm 2, MS f/u. Pt on ocrevus through intrafusion and last dose was 08/02/21 next infusion due 01/31/2022. She has noticed increase in short term memory concerns. Having to concentrate a lot more. Has residual lung concerns from covid that she had in 2020. Has been having UTI which she has been treated with Keflex. Her last dose is today. She doesn't feel symptoms have improved    HISTORY OF PRESENT ILLNESS:  Pamela Potter is a 56 y.o. woman with relapsing remitting multiple sclerosis.  Update 12/13/2021 She is on Ocrevus.  She feels her MS is doing ok and has no recent exacerbation or new MS symptoms.   She tolerates Ocrevus well.     Gait is stable.   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  .   She does worse in heat. She needs to hold the bannister on stairs.    She feels her right leg does worse than her left leg.       She has urinary urgency and occasional incontinence and wears pads.    She currently has a UTI - these are rare, 1st one in > 3 years.   Vision has done well.   She has keratoconus and wears special contacts.   She had OD surgery  She has daily fatigue taht has seemed worse since Covid in late 2021.       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  She often does not fall asleep for a few hours and ten sleeps in late.    She is often hot at night and has trouble getting comfortable.  She twists a lot at night even while asleep.     She has never taken trazodone or other sleep aid.  She denies depression.    She notes verbal fluency ans short term memory issues are worse.     She has right hip pain and left knee pain.   She feels bloated at times.        MS History: She was diagnosed with MS in 1991 after presenting with gait disturbance, weakness and vertigo. An MRI of the brain showed plaques and a lumbar puncture were reportedly consistent with MS. She saw Dr. Adolphus Birchwood a couple years later and was started her on Avonex.   She stayed on Avonex for about 10 years but then had new lesions on her MRI. She was referred to Dr. Jacqulynn Cadet. He placed her on Tysabri and she stayed on for a few years. 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 Unfortunately, on Gilenya, she had some MRI progression and Tysabri was restarted. . Of note, her JCV antibody titers were initially less than 0.9. However, over the last couple years her titers have increased and her last JCV antibody was 1.03. She was switched to ocrelizumab and had her first dose the end of June 2017 and her second dose in mid July. She has not noted any exacerbations while on ocrelizumab.   DATA: MRI images from 02/10/2016 show classic T2/FLAIR hyperintense foci  in the periventricular, juxtacortical and deep white matter of both hemispheres and also in the pons and right middle cerebellar peduncle in a pattern and configuration consistent with chronic demyelinating plaque associated with MS.   There were no acute findings and no change when compared to the previous MRI, from 05/17/2014  The MRI of the cervical and thoracic spine 2017 showed foci in the upper cervical spine and in the mid to lower thoracic spine. None of these were acute.      I reviewed her labs. Her JCV antibody on 12/25/2015 was 1.03 (middle positive). She had normal CD4 and CD8 cells.   Hepatitis B labs and Quantiferon TB test were negative or nonreactive.  MRI 02/12/2020 cervical spine:  Patchy T2 hyperintense foci within the upper cervical spine as described above.  None of the foci  appear to be acute and there are no definite new lesions compared to the previous MRI from 2015.   Small left paramedian disc protrusion at C6-C7, not present on the 2015 MRI, that does not lead to nerve root compression or spinal stenosis  MRI 02/12/2020 Brain:  Multiple T2/FLAIR hyperintense foci in the brainstem, middle cerebellar peduncles and hemispheres in a pattern and configuration consistent with chronic demyelinating plaque associated with multiple sclerosis.  None of the foci appear to be acute.  Compared to the MRI from 01/08/2018, there are no new lesions.    REVIEW OF SYSTEMS: Constitutional: No fevers, chills, sweats, or change in appetite.  Reports fatigue eyes: as abvove Ear, nose and throat: No hearing loss, ear pain, nasal congestion, sore throat Cardiovascular: No chest pain, palpitations Respiratory:  No shortness of breath at rest or with exertion.   No wheezes GastrointestinaI: No nausea, vomiting, diarrhea, abdominal pain, fecal incontinence Genitourinary:  Notes frequency and nocturia with occasional urge incontinence Musculoskeletal:  No neck pain, back pain Integumentary: No rash, pruritus, skin lesions Neurological: as above Psychiatric: No depression at this time.  She denies anxiety. Endocrine: No palpitations, diaphoresis, change in appetite, change in weigh or increased thirst Hematologic/Lymphatic:  No anemia, purpura, petechiae. Allergic/Immunologic: No itchy/runny eyes, nasal congestion, recent allergic reactions, rashes  ALLERGIES: Allergies  Allergen Reactions   Ace Inhibitors Swelling    lips   Sulfa Antibiotics Hives and Itching   Amoxicillin Diarrhea   Benadryl [Diphenhydramine] Other (See Comments)    Difficulty swallowing   Dilantin [Phenytoin Sodium Extended]    Lisinopril    Norvasc [Amlodipine Besylate]     HOME MEDICATIONS:  Current Outpatient Medications:    albuterol (VENTOLIN HFA) 108 (90 Base) MCG/ACT inhaler, Inhale 1 puff into the  lungs every 4 (four) hours., Disp: , Rfl:    carvedilol (COREG) 6.25 MG tablet, Take 6.25 mg by mouth 2 (two) times daily with a meal., Disp: , Rfl:    cetirizine (ZYRTEC ALLERGY) 10 MG tablet, Take 1 tablet (10 mg total) by mouth daily., Disp: 30 tablet, Rfl: 11   Fluticasone-Umeclidin-Vilant (TRELEGY ELLIPTA) 200-62.5-25 MCG/INH AEPB, Inhale 1 puff into the lungs daily., Disp: 60 each, Rfl: 11   ipratropium (ATROVENT) 0.06 % nasal spray, Place 2 sprays into both nostrils 2 (two) times daily., Disp: 15 mL, Rfl: 12   levothyroxine (SYNTHROID, LEVOTHROID) 50 MCG tablet, Take 50 mcg by mouth daily before breakfast., Disp: , Rfl:    ocrelizumab 600 mg in sodium chloride 0.9 % 500 mL, Inject 600 mg into the vein every 6 (six) months., Disp: , Rfl:    prednisoLONE acetate (PRED MILD) 0.12 %  ophthalmic suspension, 1 drop 4 (four) times daily., Disp: , Rfl:    timolol (BETIMOL) 0.5 % ophthalmic solution, Place 1 drop into the right eye daily., Disp: , Rfl:    triamterene-hydrochlorothiazide (DYAZIDE) 37.5-25 MG capsule, Take 1 capsule by mouth as needed., Disp: , Rfl:   PAST MEDICAL HISTORY: Past Medical History:  Diagnosis Date   Anemia    Hypertension    Multiple sclerosis ([place])    Neuromuscular disorder ([place])    Vertigo    Vision abnormalities     PAST SURGICAL HISTORY: Past Surgical History:  Procedure Laterality Date   EYE SURGERY      FAMILY HISTORY: Family History  Problem Relation Age of Onset   Hyperlipidemia Mother    Hypertension Mother    Stroke Father    Diabetes Father     SOCIAL HISTORY:  Social History   Socioeconomic History   Marital status: Married    Spouse name: Not on file   Number of children: Not on file   Years of education: Not on file   Highest education level: Not on file  Occupational History   Not on file  Tobacco Use   Smoking status: Never   Smokeless tobacco: Never  Substance and Sexual Activity   Alcohol use: Never    Alcohol/week: 0.0  standard drinks   Drug use: Never   Sexual activity: Not on file  Other Topics Concern   Not on file  Social History Narrative   Not on file   Social Determinants of Health   Financial Resource Strain: Not on file  Food Insecurity: Not on file  Transportation Needs: Not on file  Physical Activity: Not on file  Stress: Not on file  Social Connections: Not on file  Intimate Partner Violence: Not on file     PHYSICAL EXAM  Vitals:   12/13/21 1054  BP: 131/89  Pulse: 85  Weight: 147 lb (66.7 kg)  Height: 5' 3.5" (1.613 m)    Body mass index is 25.63 kg/m.   General: The patient is well-developed and well-nourished and in no acute distress   Neurologic Exam  Mental status: The patient is alert and oriented x 3 at the time of the examination. The patient has apparent normal recent and remote memory, with an apparently normal attention span and concentration ability.   Speech is normal.  Cranial nerves: Extraocular movements are full.  Facial strength and sensation are normal.  Trapezius strength is good.   No obvious hearing deficits are noted.  Motor:  Muscle bulk is normal but tone is mildly increased in legs.. Strength is  5 / 5 in all 4 extremities except 4 distal right foot.   Sensory: Intact touch and vibration in arms and legs  Coordination:   Good bilateral FTN but reduced heel to shin bilaterally  Gait and station: Station is normal.   Her gait is wide.  Tandem gait is very wide.  She walks better with support.. Mild right foot drop.  Romberg is negative.   Reflexes: Deep tendon reflexes are symmetric and normal in both arms. DTRs are increased in legs with spread at the knees.        Multiple sclerosis ([place]) - Plan: IgG, IgA, IgM, CD19 and CD20, Flow Cytometry  High risk medication use - Plan: IgG, IgA, IgM, CD19 and CD20, Flow Cytometry  Left knee pain, unspecified chronicity  Urge incontinence of urine  Other fatigue  Insomnia, unspecified  type  Urinary tract infection  without hematuria, site unspecified   1.   Continue Ocrevus RRMS.     We will check CD19/20 and IgG/IgM today 2.    Stay active and exercise as tolerated.   Due to physical and cognitive impairment and fatigue she is disabled and unable to return to work.   3.   SHe will return to see [place] in 6 months or sooner if there are new or worsening neurologic symptoms. 4.  She is being treated for UTI but notes that she is still having almost as much burning.  I will go ahead and recheck a UA and a urine culture to make sure that her antibiotic is giving good coverage  5.   Trazodone as needed insomnia  6.   rtc 6 months or sooner if ne or worsening issues  Lastacia Solum A. Felecia Shelling, MD, PhD, FAAN Certified in Neurology, Clinical Neurophysiology, Sleep Medicine, Pain Medicine and Neuroimaging Director, [place] [facility] [address] [place], [place] [address] [phone]

## 2021-12-14 ENCOUNTER — Encounter: Payer: Self-pay | Admitting: Pulmonary Disease

## 2021-12-14 ENCOUNTER — Other Ambulatory Visit: Payer: Self-pay

## 2021-12-14 ENCOUNTER — Ambulatory Visit: Payer: 59 | Admitting: Pulmonary Disease

## 2021-12-14 VITALS — BP 134/72 | HR 84 | Temp 98.5°F | Ht 63.0 in | Wt 147.0 lb

## 2021-12-14 DIAGNOSIS — R053 Chronic cough: Secondary | ICD-10-CM

## 2021-12-14 DIAGNOSIS — J4520 Mild intermittent asthma, uncomplicated: Secondary | ICD-10-CM

## 2021-12-14 NOTE — Patient Instructions (Signed)
Nice to see you again ? ?I am glad things are better on the Trelegy ? ?Your blood pressure today looks okay.  Continue to take the Trelegy in the evenings as opposed to the morning. ? ?Given the concern about blood pressure and the fluctuation today, ask your primary doctor about an ambulatory blood pressure monitor to further assess these changes and to assess if you need more blood pressure medicine or not. ? ?We will get a chest x-ray at your next visit ? ?Return to clinic in 3 months or sooner as needed with Dr. Silas Flood ?

## 2021-12-14 NOTE — Progress Notes (Signed)
$'@Patient'V$  ID: Pamela Potter, female    DOB: 12/20/1965, 56 y.o.   MRN: [id number]  Chief Complaint  Patient presents with   Follow-up    Follo wup for cough. Pt states that she feels like the cough is doing better. Pt states her BP is going up and down at time     Referring provider: [name], [name]  HPI:   56 y.o. whom we are seeing in follow-up for evaluation of dyspnea on exertion and cough since COVID-19 infection 09/2020.    Overall, doing okay.  She continues to report good adherence to Trelegy.  This helped her cough and breathing overall.  Dyspnea not too bad.  Discussed concern with elevated blood pressure on Trelegy.  Notably she has baseline hypertension and had been on medicine in the past.  This was stopped due to reports of low blood pressure at home.  She has resumed BP medication about 1 month ago with improved blood pressures at home since starting this.  Also switch to Trelegy to the evenings just prior to taking her nightly antihypertensive which seems to help.  Still has some fluctuating readings to the day which seem to be higher per her report.  Review chest x-ray 09/12/2021 at last visit on my review interpretation shows relatively clear lungs with may be linear infiltrates left side likely effect of atelectasis, no real correlate on the lateral view.   HPI at initial visit Patient contracted COVID 09/2020.  She received antibody infusion.  She had COVID-vaccine x3 prior to infection.  Symptoms were nasal congestion, sore throat.  Mild dyspnea.  Though symptoms gradually improved except congestion in the nasal passages has persisted.  Dyspnea exertion has worsened.  Dyspnea worse up inclines or stairs.  Can be present on flat surfaces as well.  No timing during day when things are better or worse.  No seasonal or environmental changes make things better or worse.  She did receive prednisone a few days ago and this seemed to help somewhat.  Similarly, no rhyme or  reason to her cough.  No relieving or exacerbating factors that she can readily identify.  No timing during day were things are better or worse.  No seasonal or environmental changes.  With the symptoms she went to the ED 11/2020 and had a CTA chest PE protocol performed which on my interpretation reveals no evidence of PE, scattered faint groundglass opacities bilaterally throughout all lobes consistent with atypical pneumonia versus resolving COVID-19 pneumonia.  She was given doxycycline.  Per neurology notes 12/2020 symptoms improved quite a bit with this intervention.  However patient does not recall this and states nothing is really helped.  PMH: Hypertension, MS Surgical history: Reviewed reviewed with patient, she denies surgeries Family history: Reviewed with patient, she denies respiratory illnesses in first-degree relatives Social history: Never smoker, lives with husband, lives in [place] / Pulmonary Flowsheets:   ACT:  No flowsheet data found.  MMRC: No flowsheet data found.  Epworth:  No flowsheet data found.  Tests:   FENO:  No results found for: NITRICOXIDE  PFT: PFT Results Latest Ref Rng & Units 02/20/2021  FVC-Pre L 2.35  FVC-Predicted Pre % 90  FVC-Post L 2.34  FVC-Predicted Post % 90  Pre FEV1/FVC % % 87  Post FEV1/FCV % % 88  FEV1-Pre L 2.03  FEV1-Predicted Pre % 99  FEV1-Post L 2.06  DLCO uncorrected ml/min/mmHg 9.65  DLCO UNC% % 49  DLCO corrected ml/min/mmHg 9.65  DLCO COR %Predicted % 49  DLVA Predicted % 68  TLC L 3.12  TLC % Predicted % 64  RV % Predicted % 48  Personally reviewed and interpreted as normal spirometry with moderate restriction based on TLC with severely reduced DLCO.  WALK:  No flowsheet data found.  Imaging: Personally reviewed and as per EMR discussion this note CT high-res 03/2021.  This demonstrated on my interpretation scattered groundglass opacities with mild bronchiectasis scattered throughout  concerning for ongoing inflammation from COVID-19 with the presence of bronchiectasis developing fibrosis although there is no frank fibrosis demonstrated on the scan.  Lab Results: Personally reviewed, no significant elevation of eosinophils and no anemia CBC    Component Value Date/Time   WBC 5.7 06/14/2021 1056   RBC 4.53 06/14/2021 1056   HGB 11.0 (L) 06/14/2021 1056   HCT 34.7 06/14/2021 1056   PLT 390 06/14/2021 1056   MCV 77 (L) 06/14/2021 1056   MCH 24.3 (L) 06/14/2021 1056   MCHC 31.7 06/14/2021 1056   RDW 16.5 (H) 06/14/2021 1056   LYMPHSABS 0.9 06/14/2021 1056   EOSABS 0.1 06/14/2021 1056   BASOSABS 0.0 06/14/2021 1056    BMET    Component Value Date/Time   NA 138 07/11/2020 1343   K 4.2 07/11/2020 1343   CL 99 07/11/2020 1343   CO2 24 07/11/2020 1343   GLUCOSE 86 07/11/2020 1343   BUN 21 07/11/2020 1343   CREATININE 1.04 (H) 07/11/2020 1343   CALCIUM 10.0 07/11/2020 1343   GFRNONAA 61 07/11/2020 1343   GFRAA 70 07/11/2020 1343    BNP No results found for: BNP  ProBNP No results found for: PROBNP  Specialty Problems   None  Allergies  Allergen Reactions   Ace Inhibitors Swelling    lips   Sulfa Antibiotics Hives and Itching   Amoxicillin Diarrhea   Benadryl [Diphenhydramine] Other (See Comments)    Difficulty swallowing   Dilantin [Phenytoin Sodium Extended]    Lisinopril    Norvasc [Amlodipine Besylate]     Immunization History  Administered Date(s) Administered   Influenza,inj,Quad PF,6+ Mos 08/13/2018   Influenza-Unspecified 08/08/2014, 07/14/2021   Zoster, Live 09/07/2013    Past Medical History:  Diagnosis Date   Anemia    Hypertension    Multiple sclerosis ([place])    Neuromuscular disorder ([place])    Vertigo    Vision abnormalities     Tobacco History: Social History   Tobacco Use  Smoking Status Never  Smokeless Tobacco Never   Counseling given: Not Answered   Continue to not smoke  Outpatient Encounter Medications  as of 12/14/2021  Medication Sig   albuterol (VENTOLIN HFA) 108 (90 Base) MCG/ACT inhaler Inhale 1 puff into the lungs every 4 (four) hours.   carvedilol (COREG) 6.25 MG tablet Take 6.25 mg by mouth 2 (two) times daily with a meal.   cetirizine (ZYRTEC ALLERGY) 10 MG tablet Take 1 tablet (10 mg total) by mouth daily.   Fluticasone-Umeclidin-Vilant (TRELEGY ELLIPTA) 200-62.5-25 MCG/INH AEPB Inhale 1 puff into the lungs daily.   ipratropium (ATROVENT) 0.06 % nasal spray Place 2 sprays into both nostrils 2 (two) times daily.   levothyroxine (SYNTHROID, LEVOTHROID) 50 MCG tablet Take 50 mcg by mouth daily before breakfast.   ocrelizumab 600 mg in sodium chloride 0.9 % 500 mL Inject 600 mg into the vein every 6 (six) months.   prednisoLONE acetate (PRED MILD) 0.12 % ophthalmic suspension 1 drop 4 (four) times daily.   timolol (BETIMOL) 0.5 %  ophthalmic solution Place 1 drop into the right eye daily.   traZODone (DESYREL) 50 MG tablet Take 1 tablet (50 mg total) by mouth at bedtime.   triamterene-hydrochlorothiazide (DYAZIDE) 37.5-25 MG capsule Take 1 capsule by mouth as needed.   No facility-administered encounter medications on file as of 12/14/2021.     Review of Systems  Review of Systems  N/a Physical Exam  BP 134/72 (BP Location: Left Arm, Patient Position: Sitting, Cuff Size: Normal)    Pulse 84    Temp 98.5 F (36.9 C) (Oral)    Ht '5\' 3"'$  (1.6 m)    Wt 147 lb (66.7 kg)    LMP 01/01/2017 (Exact Date)    SpO2 97%    BMI 26.04 kg/m   Wt Readings from Last 5 Encounters:  12/14/21 147 lb (66.7 kg)  12/13/21 147 lb (66.7 kg)  09/12/21 143 lb 12.8 oz (65.2 kg)  06/14/21 141 lb 12.8 oz (64.3 kg)  05/02/21 142 lb 6.4 oz (64.6 kg)    BMI Readings from Last 5 Encounters:  12/14/21 26.04 kg/m  12/13/21 25.63 kg/m  09/12/21 26.30 kg/m  06/14/21 25.94 kg/m  05/02/21 26.05 kg/m     Physical Exam General: Well-appearing, no acute distress Eyes: EOMI, no icterus Neck: Supple no  JVP Cardiovascular: Regular rate and rhythm, no murmur Pulmonary: Clear to auscultation bilaterally, no wheeze or crackle, normal for breathing Abdomen: Nondistended, bowel sounds present MSK: No synovitis, no joint effusion Neuro: Normal gait, no weakness Psych: Normal mood, full affect   Assessment & Plan:   Cough: Overall improved.  Likely multifactorial but mostly related to nasal congestion new since COVID infection.  Also concern for asthma as below. Zyrtec with some mild improvement.  Did not tolerate Flonase nor azelastine.  Montelukast not effective.  Continue ipratropium nasal spray.  Dyspnea on exertion: Multifactorial related to prior COVID infection and likely asthma.  Possible deconditioning.  CT scan 03/2021 with areas of groundglass and bronchiectasis that is concerning for development of fibrosis in my opinion.  Repeat chest x-ray 09/14/2021 is clear which is encouraging.  Overall, dyspnea is relatively minimal.  Presumed asthma: Triggered by COVID-19 infection.  Breo without much improvement.  Trelegy did yield improvement in breathing and dyspnea on exertion as well as cough. To continue trelegy.  Hypertension: Better since resuming triamterene.  She is concerned related to Trelegy.  I think this is unlikely.  Now using Trelegy in the evenings prior to triamterene dosing.  Advised her to discuss ambulatory blood pressure monitoring with PCP if ongoing concerns for hypertension that seems to fluctuate on her home blood pressure monitor.  Notably blood pressure in clinic today is okay.   Return in about 3 months (around 03/16/2022).   Lanier Clam, MD 12/14/2021

## 2021-12-16 LAB — URINALYSIS, ROUTINE W REFLEX MICROSCOPIC
Bilirubin, UA: NEGATIVE
Glucose, UA: NEGATIVE
Ketones, UA: NEGATIVE
Nitrite, UA: NEGATIVE
Specific Gravity, UA: 1.021 (ref 1.005–1.030)
Urobilinogen, Ur: 0.2 mg/dL (ref 0.2–1.0)
pH, UA: 5.5 (ref 5.0–7.5)

## 2021-12-16 LAB — IGG, IGA, IGM
IgA/Immunoglobulin A, Serum: 61 mg/dL — ABNORMAL LOW (ref 87–352)
IgG (Immunoglobin G), Serum: 749 mg/dL (ref 586–1602)
IgM (Immunoglobulin M), Srm: 68 mg/dL (ref 26–217)

## 2021-12-16 LAB — URINE CULTURE

## 2021-12-16 LAB — MICROSCOPIC EXAMINATION
Casts: NONE SEEN /lpf
Epithelial Cells (non renal): >10 /hpf — AB (ref 0–10)
WBC, UA: >30 /hpf — AB (ref 0–5)

## 2021-12-16 LAB — CD19 AND CD20, FLOW CYTOMETRY

## 2022-01-10 ENCOUNTER — Other Ambulatory Visit: Payer: Self-pay | Admitting: Neurology

## 2022-02-12 IMAGING — DX DG CHEST 2V
2 series · 2 of 2 positions shown · non-contrast
Comparison: Chest CT dated 03/16/2021.

CLINICAL DATA: Cough.

EXAM:
CHEST - 2 VIEW

[chest pa]
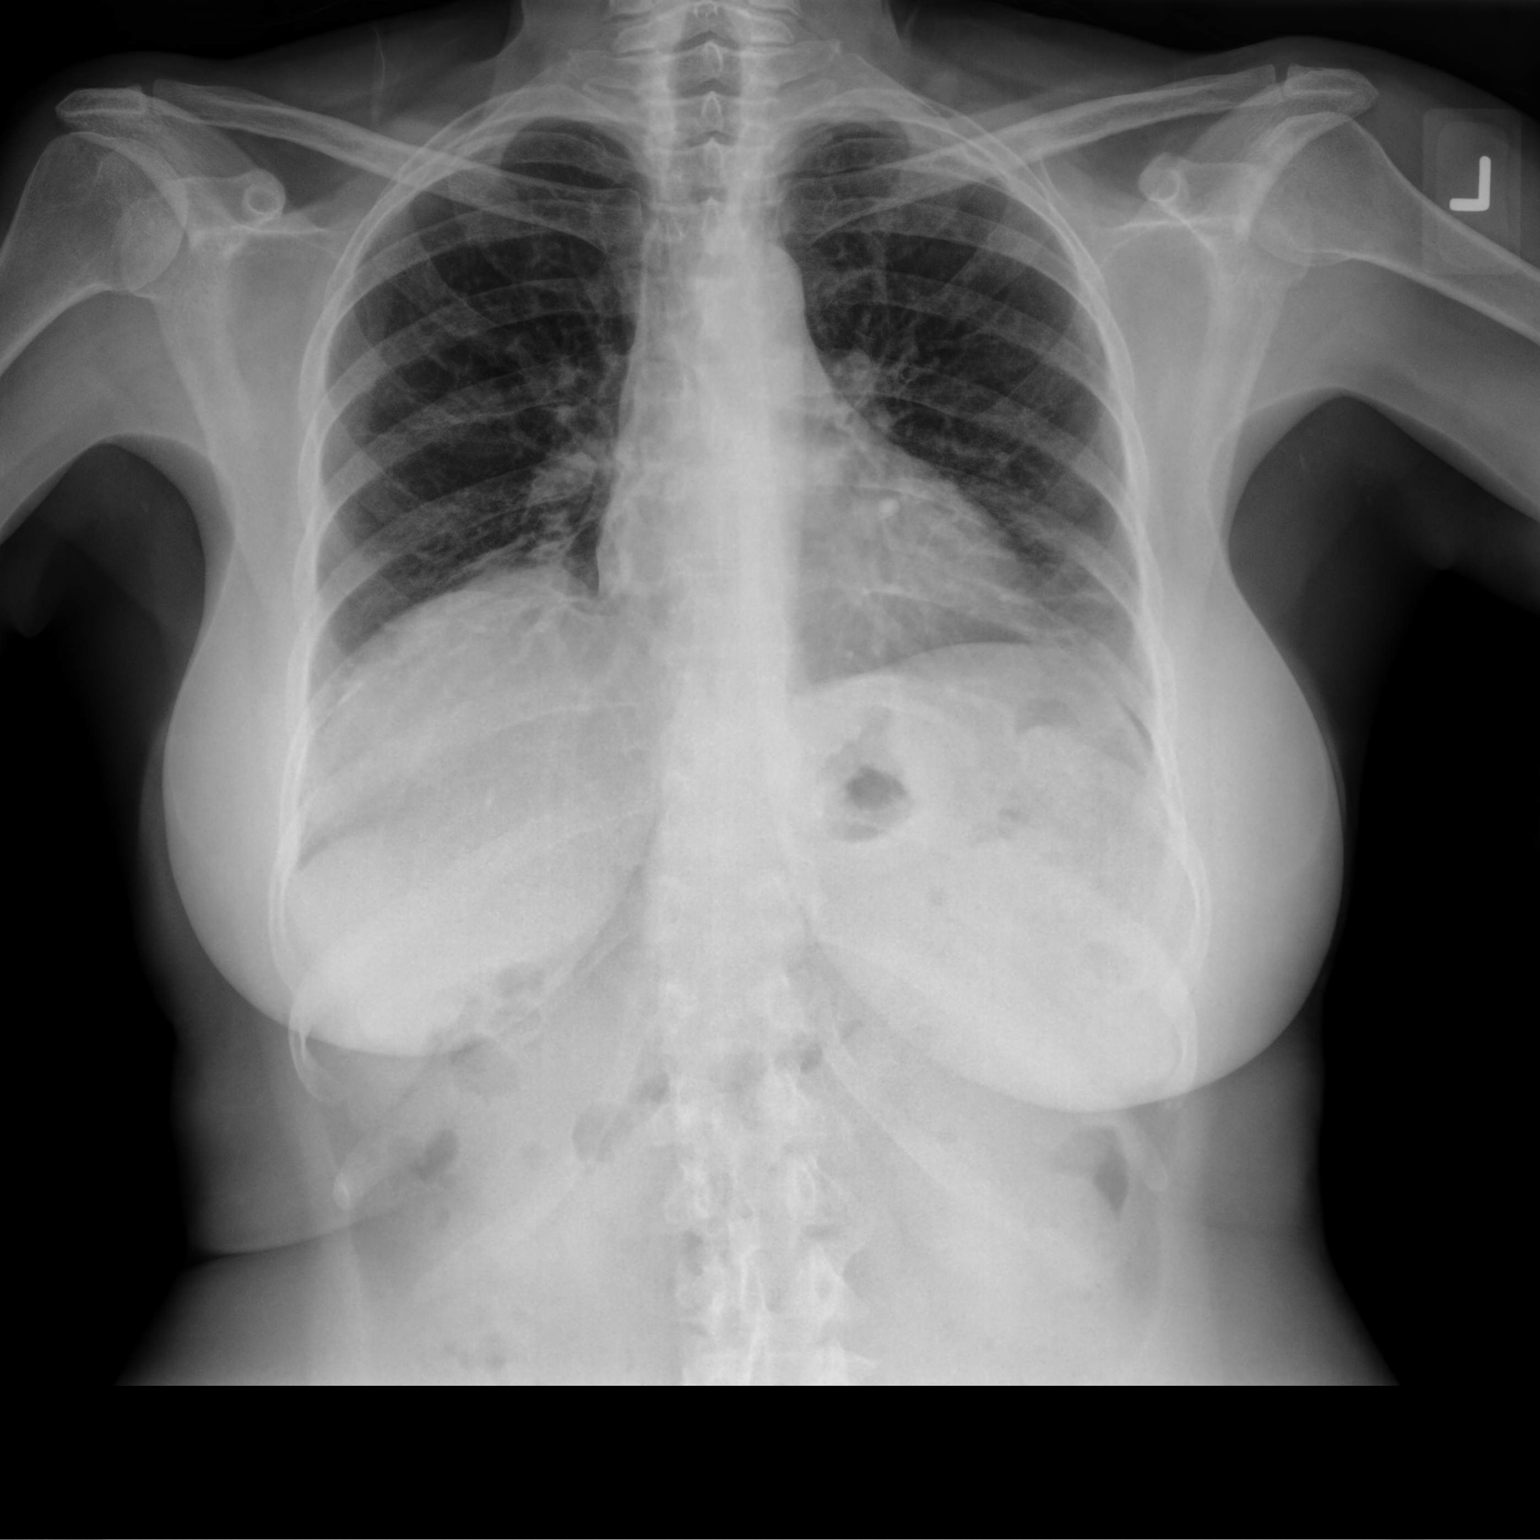

[chest lat]
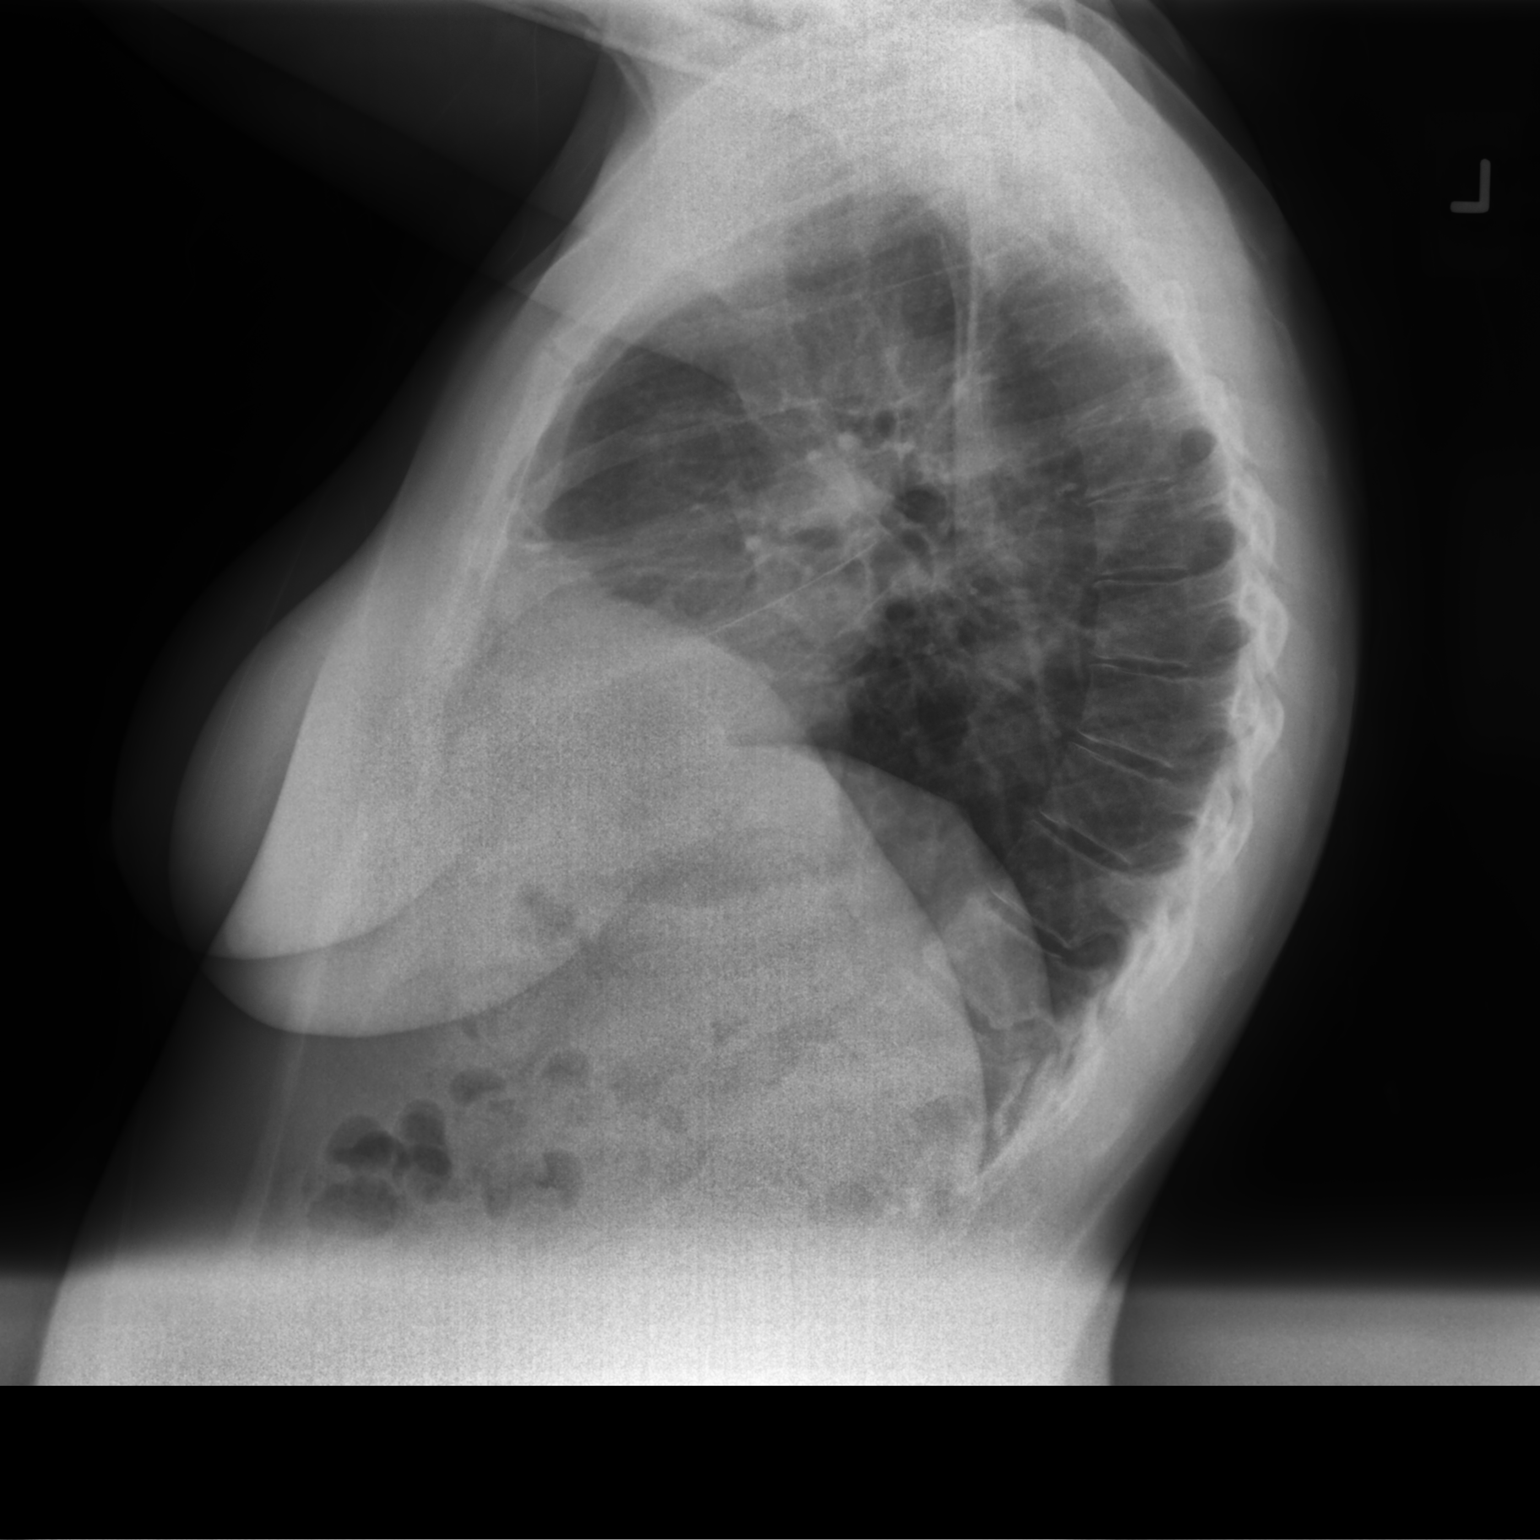

[2 of 2 positions shown; findings below may reference images not displayed]

FINDINGS: Bibasilar atelectasis/scarring. No focal consolidation, pleural
effusion, pneumothorax. The cardiac silhouette is within limits. No
acute osseous pathology.
IMPRESSION: No active cardiopulmonary disease.

## 2022-03-18 ENCOUNTER — Encounter (HOSPITAL_COMMUNITY): Payer: Self-pay | Admitting: Emergency Medicine

## 2022-03-18 ENCOUNTER — Ambulatory Visit (HOSPITAL_COMMUNITY)
Admission: EM | Admit: 2022-03-18 | Discharge: 2022-03-18 | Disposition: A | Discharge status: Home / Self Care | Payer: 59 | Attending: Physician Assistant | Admitting: Physician Assistant

## 2022-03-18 DIAGNOSIS — R609 Edema, unspecified: Secondary | ICD-10-CM | POA: Diagnosis not present

## 2022-03-18 DIAGNOSIS — R110 Nausea: Secondary | ICD-10-CM

## 2022-03-18 MED ORDER — ONDANSETRON HCL 4 MG PO TABS: 4.0000 mg | ORAL_TABLET | Freq: Three times a day (TID) | ORAL | 0 refills | Status: DC | PRN

## 2022-03-18 NOTE — Discharge Instructions (Addendum)
Advise to take the Zofran 1 tablet every 6 hours as needed for nausea. Advised to increase the fluid intake. Advised to observe symptoms, watchful waiting over the next couple days. Advised to follow-up with PCP or return to urgent care if symptoms fail to improve.

## 2022-03-18 NOTE — ED Triage Notes (Signed)
Pt reports that she ate a hamburger yesterday and started having swelling all over, face, arms, legs that started last night. Pt c/o headache today from swelling in face/eyes. Pt reports pain was worse last night.

## 2022-03-18 NOTE — ED Provider Notes (Signed)
[facility]    CSN: [id number] Arrival date & time: 03/18/22  1556      History   Chief Complaint Chief Complaint  Patient presents with   Edema   Headache    HPI Pamela Potter is a 56 y.o. female.   56 year old female presents with swelling all over.  Patient relates that she ate hamburger restaurant yesterday and then afterwards she started swelling.  Patient relates she has been swelling in her legs, arms, abdomen, and face.  Patient relates she has been having some fatigue also associated.  Patient relates she is not having any fever or chills, no shortness of breath or wheezing.  Patient does relate that she has had some mild nausea since eating the hamburgers but she has not thrown up, she has had some loose bowels over the past day, but no diarrhea.  Patient is tolerating fluids well.   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      Home Medications    Prior to Admission medications   Medication Sig Start Date End Date Taking? Authorizing Provider  ondansetron (ZOFRAN) 4 MG tablet Take 1 tablet (4 mg total) by mouth every 8 (eight) hours as needed for nausea or vomiting. 03/18/22  Yes Nyoka Lint, PA-C  albuterol (VENTOLIN HFA) 108 (90 Base) MCG/ACT inhaler Inhale 1 puff into the lungs  every 4 (four) hours. 01/17/21   [provider]  carvedilol (COREG) 6.25 MG tablet Take 6.25 mg by mouth 2 (two) times daily with a meal.    [provider]  cetirizine (ZYRTEC ALLERGY) 10 MG tablet Take 1 tablet (10 mg total) by mouth daily. 05/02/21   Hunsucker, Bonna Gains, MD  Fluticasone-Umeclidin-Vilant (TRELEGY ELLIPTA) 200-62.5-25 MCG/INH AEPB Inhale 1 puff into the lungs daily. 03/29/21   Hunsucker, Bonna Gains, MD  ipratropium (ATROVENT) 0.06 % nasal spray Place 2 sprays into both nostrils 2 (two) times daily. 09/12/21   Hunsucker, Bonna Gains, MD  levothyroxine (SYNTHROID, LEVOTHROID) 50 MCG tablet Take 50 mcg by mouth daily before breakfast.    [provider]  ocrelizumab 600 mg in sodium chloride 0.9 % 500 mL Inject 600 mg into the vein every 6 (six) months.    [provider]  prednisoLONE acetate (PRED MILD) 0.12 % ophthalmic suspension 1 drop 4 (four) times daily.    [provider]  timolol (BETIMOL) 0.5 % ophthalmic solution Place 1 drop into the right eye daily.    [provider]  traZODone (DESYREL) 50 MG tablet TAKE 1 TABLET BY MOUTH EVERYDAY AT BEDTIME 01/10/22   Sater, Nanine Means, MD  triamterene-hydrochlorothiazide (DYAZIDE) 37.5-25 MG capsule Take 1 capsule by mouth as needed.    [provider]    Family History Family History  Problem Relation Age of Onset   Hyperlipidemia Mother    Hypertension Mother    Stroke Father    Diabetes Father     Social History Social History   Tobacco Use   Smoking status: Never   Smokeless tobacco: Never  Substance Use Topics   Alcohol use: Never    Alcohol/week: 0.0 standard drinks of alcohol   Drug use: Never     Allergies   Ace inhibitors, Sulfa antibiotics, Amoxicillin, Benadryl [diphenhydramine], Dilantin [phenytoin sodium extended], Lisinopril, and Norvasc [amlodipine besylate]   Review of Systems Review of Systems  Gastrointestinal:  Positive for nausea.   Neurological:  Positive for headaches.     Physical Exam Triage Vital Signs ED Triage Vitals  Enc Vitals Group     BP 03/18/22 1617 121/83     Pulse Rate 03/18/22 1617 72     Resp 03/18/22 1617 16     Temp 03/18/22 1617 99 F (37.2 C)     Temp Source 03/18/22 1617 Oral     SpO2 03/18/22 1617 97 %     Weight --      Height --      Head Circumference --      Peak Flow --      Pain Score 03/18/22 1616 4     Pain Loc --      Pain Edu? --      Excl. in [place]? --    No data found.  Updated Vital Signs BP 121/83 (BP Location: Left Arm)   Pulse 72   Temp 99 F (37.2 C) (Oral)   Resp 16   LMP 01/01/2017 (Exact Date)   SpO2 97%   Visual Acuity Right Eye Distance:   Left Eye Distance:   Bilateral Distance:    Right Eye Near:   Left Eye Near:    Bilateral Near:     Physical Exam Constitutional:      Appearance: She is well-developed.  HENT:     Right Ear: Tympanic membrane and ear canal normal.     Left Ear: Tympanic membrane and ear canal normal.     Mouth/Throat:     Mouth: Mucous membranes are moist.     Pharynx: Oropharynx is clear. No posterior oropharyngeal erythema.  Cardiovascular:     Rate and Rhythm: Normal rate and regular rhythm.     Heart sounds: Normal heart sounds.  Pulmonary:     Effort: Pulmonary effort is normal.     Breath sounds: Normal breath sounds and air entry. No wheezing, rhonchi or rales.  Abdominal:     General: Abdomen is flat. Bowel sounds are normal.     Palpations: Abdomen is soft.     Tenderness: There is no abdominal tenderness.  Lymphadenopathy:     Cervical: No cervical adenopathy.  Skin:    Comments: Skin: There is no visual evidence of swelling of the lower legs, bilateral arms, face, or abdomen.  There is no rash noted on the skin  Neurological:     Mental Status: She is alert.      UC Treatments / Results  Labs (all labs ordered are listed, but only abnormal results are displayed) Labs Reviewed - No data to  display  EKG   Radiology No results found.  Procedures Procedures (including critical care time)  Medications Ordered in UC Medications - No data to display  Initial Impression / Assessment and Plan / UC Course  I have reviewed the triage vital signs and the nursing notes.  Pertinent labs & imaging results that were available during my care of the patient were reviewed by me and considered in my medical decision making (see chart for details).    Plan: 1.  Patient advised to take the Zofran 1 tablet every 6 hours as needed to control the nausea. 2.Patient advised watchful waiting to see if the symptoms resolve over the next couple days. 3.  Patient advised to follow-up PCP or return to urgent care if symptoms fail to improve. Final Clinical Impressions(s) / UC Diagnoses   Final diagnoses:  Swelling  Nausea without vomiting     Discharge Instructions      Advise to take the Zofran 1 tablet every 6 hours as needed for nausea. Advised to increase the fluid intake. Advised to observe symptoms, watchful waiting over the next couple days. Advised to follow-up with PCP or return to urgent care if symptoms fail to improve.    ED Prescriptions     Medication Sig Dispense Auth. Provider   ondansetron (ZOFRAN) 4 MG tablet Take 1 tablet (4 mg total) by mouth every 8 (eight) hours as needed for nausea or vomiting. 20 tablet Nyoka Lint, PA-C      PDMP not reviewed this encounter.   Nyoka Lint, PA-C 03/18/22 1659

## 2022-04-06 ENCOUNTER — Ambulatory Visit
Admission: EM | Admit: 2022-04-06 | Discharge: 2022-04-06 | Disposition: A | Discharge status: Home / Self Care | Payer: 59 | Attending: Internal Medicine | Admitting: Internal Medicine

## 2022-04-06 DIAGNOSIS — J069 Acute upper respiratory infection, unspecified: Secondary | ICD-10-CM

## 2022-04-06 DIAGNOSIS — H6122 Impacted cerumen, left ear: Secondary | ICD-10-CM | POA: Diagnosis not present

## 2022-04-06 MED ORDER — GUAIFENESIN 200 MG PO TABS: 200.0000 mg | ORAL_TABLET | ORAL | 0 refills | Status: DC | PRN

## 2022-04-06 NOTE — ED Triage Notes (Signed)
Pt present facial pain with nasal congestion and headache. Symptoms started two days ago.

## 2022-04-06 NOTE — Discharge Instructions (Signed)
You have been prescribed medication to alleviate symptoms.  It appears that you have a viral illness that should run its course and alleviate with symptomatic treatment.  Also recommend saline rinses such as a Nettie pot.  Please follow-up if symptoms persist or worsen.

## 2022-04-06 NOTE — ED Provider Notes (Addendum)
[name]    CSN: [id number] Arrival date & time: 04/06/22  1306      History   Chief Complaint Chief Complaint  Patient presents with   Facial Pain    HPI Pamela Potter is a 56 y.o. female.   Patient presents with sinus pain, sinus pressure, nasal congestion, headache that started 2 days ago.  Denies any known sick contacts or fever.  Denies cough, chest pain, shortness of breath, sore throat, ear pain, nausea, vomiting, diarrhea, abdominal pain.  Patient does report a lot of drainage in the back of the throat as well.  She has not taken any medications to help alleviate symptoms.  She does have history of asthma and takes Trelegy as well as acetylcysteine nasal spray daily.     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      Home Medications    Prior to Admission medications   Medication Sig Start Date End Date Taking? Authorizing Provider  guaiFENesin 200 MG tablet Take 1 tablet (200 mg total) by mouth every 4 (four) hours as needed for cough or to loosen phlegm. 04/06/22  Yes Aditi Rovira, Hildred Alamin E, FNP  albuterol (VENTOLIN HFA) 108 (90 Base) MCG/ACT inhaler Inhale 1 puff into the lungs every 4 (four) hours. 01/17/21   [provider]  carvedilol (COREG) 6.25 MG tablet Take 6.25 mg by mouth 2 (two) times daily with a meal.     [provider]  cetirizine (ZYRTEC ALLERGY) 10 MG tablet Take 1 tablet (10 mg total) by mouth daily. 05/02/21   Hunsucker, Bonna Gains, MD  Fluticasone-Umeclidin-Vilant (TRELEGY ELLIPTA) 200-62.5-25 MCG/INH AEPB Inhale 1 puff into the lungs daily. 03/29/21   Hunsucker, Bonna Gains, MD  ipratropium (ATROVENT) 0.06 % nasal spray Place 2 sprays into both nostrils 2 (two) times daily. 09/12/21   Hunsucker, Bonna Gains, MD  levothyroxine (SYNTHROID, LEVOTHROID) 50 MCG tablet Take 50 mcg by mouth daily before breakfast.    [provider]  ocrelizumab 600 mg in sodium chloride 0.9 % 500 mL Inject 600 mg into the vein every 6 (six) months.    [provider]  ondansetron (ZOFRAN) 4 MG tablet Take 1 tablet (4 mg total) by mouth every 8 (eight) hours as needed for nausea or vomiting. 03/18/22   Nyoka Lint, PA-C  prednisoLONE acetate (PRED MILD) 0.12 % ophthalmic suspension 1 drop 4 (four) times daily.    [provider]  timolol (BETIMOL) 0.5 % ophthalmic solution Place 1 drop into the right eye daily.    [provider]  traZODone (DESYREL) 50 MG tablet TAKE 1 TABLET BY MOUTH EVERYDAY AT BEDTIME 01/10/22   Sater, Nanine Means, MD  triamterene-hydrochlorothiazide (DYAZIDE) 37.5-25 MG capsule Take 1 capsule by mouth as needed.  [provider]    Family History Family History  Problem Relation Age of Onset   Hyperlipidemia Mother    Hypertension Mother    Stroke Father    Diabetes Father     Social History Social History   Tobacco Use   Smoking status: Never   Smokeless tobacco: Never  Substance Use Topics   Alcohol use: Never    Alcohol/week: 0.0 standard drinks of alcohol   Drug use: Never     Allergies   Ace inhibitors, Sulfa antibiotics, Amoxicillin, Benadryl [diphenhydramine], Dilantin [phenytoin sodium extended], Lisinopril, and Norvasc [amlodipine besylate]   Review of Systems Review of Systems Per HPI  Physical Exam Triage Vital  Signs ED Triage Vitals [04/06/22 1500]  Enc Vitals Group     BP (!) 146/88     Pulse Rate 77     Resp 18     Temp 98 F (36.7 C)     Temp Source Oral     SpO2 98 %     Weight      Height      Head Circumference      Peak Flow      Pain Score 5     Pain Loc      Pain Edu?      Excl. in [place]?    No data found.  Updated Vital Signs BP (!) 146/88 (BP Location: Left Arm)   Pulse 77   Temp 98 F (36.7 C) (Oral)   Resp 18   LMP 01/01/2017 (Exact Date)   SpO2 98%   Visual Acuity Right Eye Distance:   Left Eye Distance:   Bilateral Distance:    Right Eye Near:   Left Eye Near:    Bilateral Near:     Physical Exam Constitutional:      General: She is not in acute distress.    Appearance: Normal appearance. She is not toxic-appearing or diaphoretic.  HENT:     Head: Normocephalic and atraumatic.     Right Ear: Tympanic membrane and ear canal normal.     Left Ear: Ear canal normal. There is impacted cerumen. Tympanic membrane is not perforated, erythematous or bulging.     Ears:     Comments: Impacted cerumen in left external canal on original exam.  Ear was irrigated with successful removal of cerumen on second physical exam.    Nose: Congestion present.     Mouth/Throat:     Mouth: Mucous membranes are moist.     Pharynx: No posterior oropharyngeal erythema.  Eyes:     Extraocular Movements: Extraocular movements intact.     Conjunctiva/sclera: Conjunctivae normal.     Pupils: Pupils are equal, round, and reactive to light.  Cardiovascular:     Rate and Rhythm: Normal rate and regular rhythm.     Pulses: Normal pulses.     Heart sounds: Normal heart sounds.  Pulmonary:     Effort: Pulmonary effort is normal. No respiratory distress.     Breath sounds: Normal breath sounds. No stridor. No wheezing, rhonchi or rales.  Abdominal:     General: Abdomen is flat. Bowel sounds are normal.     Palpations: Abdomen is soft.  Musculoskeletal:        General: Normal range  of motion.     Cervical back: Normal range of motion.  Skin:    General: Skin is warm and dry.  Neurological:     General: No focal deficit present.  Mental Status: She is alert and oriented to person, place, and time. Mental status is at baseline.  Psychiatric:        Mood and Affect: Mood normal.        Behavior: Behavior normal.      UC Treatments / Results  Labs (all labs ordered are listed, but only abnormal results are displayed) Labs Reviewed  NOVEL CORONAVIRUS, NAA    EKG   Radiology No results found.  Procedures Procedures (including critical care time)  Medications Ordered in UC Medications - No data to display  Initial Impression / Assessment and Plan / UC Course  I have reviewed the triage vital signs and the nursing notes.  Pertinent labs & imaging results that were available during my care of the patient were reviewed by me and considered in my medical decision making (see chart for details).     Patient presents with symptoms likely from a viral upper respiratory infection. Differential includes bacterial pneumonia, sinusitis, allergic rhinitis, COVID-19, flu. Do not suspect underlying cardiopulmonary process. Symptoms seem unlikely related to ACS, CHF or COPD exacerbations, pneumonia, pneumothorax. Patient is nontoxic appearing and not in need of emergent medical intervention.  COVID test pending.  Recommended symptom control with medications.  Guaifenesin prescribed for patient.  Also recommend saline rinses for patient.  Cerumen impaction was removed in left external canal.  No obvious signs of infection and TM is intact on second physical exam.  Return if symptoms fail to improve in 1-2 weeks or you develop shortness of breath, chest pain, severe headache. Patient states understanding and is agreeable.  Discharged with PCP followup.  Final Clinical Impressions(s) / UC Diagnoses   Final diagnoses:  Viral upper respiratory infection  Impacted  cerumen of left ear     Discharge Instructions      You have been prescribed medication to alleviate symptoms.  It appears that you have a viral illness that should run its course and alleviate with symptomatic treatment.  Also recommend saline rinses such as a Nettie pot.  Please follow-up if symptoms persist or worsen.    ED Prescriptions     Medication Sig Dispense Auth. Provider   guaiFENesin 200 MG tablet Take 1 tablet (200 mg total) by mouth every 4 (four) hours as needed for cough or to loosen phlegm. 30 suppository [place], Michele Rockers, [place]      PDMP not reviewed this encounter.   Teodora Medici, [place] 04/06/22 [place], [place], [place] 04/06/22 1524

## 2022-04-07 LAB — NOVEL CORONAVIRUS, NAA: SARS-CoV-2, NAA: NOT DETECTED

## 2022-04-18 ENCOUNTER — Telehealth: Payer: Self-pay | Admitting: Pulmonary Disease

## 2022-04-18 MED ORDER — TRELEGY ELLIPTA 200-62.5-25 MCG/ACT IN AEPB: 1.0000 | INHALATION_SPRAY | Freq: Every day | RESPIRATORY_TRACT | 4 refills | Status: DC

## 2022-04-18 NOTE — Telephone Encounter (Signed)
Called patient and verified medication she was needing refilled and the pharmacy. Nothing further needed

## 2022-05-07 ENCOUNTER — Telehealth: Payer: Self-pay | Admitting: Neurology

## 2022-05-07 NOTE — Telephone Encounter (Signed)
Done

## 2022-05-07 NOTE — Telephone Encounter (Signed)
Claria Dice Quillian Quince) checking status of  medical record request sent on 04/20/22. Fax to: [phone]

## 2022-05-08 ENCOUNTER — Encounter: Payer: Self-pay | Admitting: Pulmonary Disease

## 2022-05-08 ENCOUNTER — Ambulatory Visit (INDEPENDENT_AMBULATORY_CARE_PROVIDER_SITE_OTHER): Payer: 59 | Admitting: Pulmonary Disease

## 2022-05-08 VITALS — BP 128/70 | HR 74 | Temp 98.6°F | Ht 63.0 in | Wt 157.2 lb

## 2022-05-08 DIAGNOSIS — J310 Chronic rhinitis: Secondary | ICD-10-CM | POA: Diagnosis not present

## 2022-05-08 DIAGNOSIS — R053 Chronic cough: Secondary | ICD-10-CM

## 2022-05-08 NOTE — Patient Instructions (Signed)
It is okay to stop the Trelegy  Continue the ipratropium nasal spray  To better treat the nasal congestion, use Flonase 1 spray each nostril twice a day, every day.  If the breathing worsens in the next couple weeks I would ask you to resume the Trelegy  Return to clinic in 4 to 6 weeks or sooner as needed with Dr. Silas Flood

## 2022-05-08 NOTE — Progress Notes (Signed)
$'@Patient'C$  ID: Pamela Potter, female    DOB: Feb 14, 1966, 56 y.o.   MRN: [id number]  Chief Complaint  Patient presents with   Follow-up    Pt is here for follow up for asthma. Pt states that she has gained a lot of weight being on the medication. Pt states she can not feel any different being on the medication. Pt states she feels like she is still congested and has to clear her throat often. Pt is on Trelegy daily and Albuterol as needed. Pt states she can not tell if she is better or worse.     Referring provider: No ref. provider found  HPI:   56 y.o. whom we are seeing in follow-up for evaluation of dyspnea on exertion and cough since COVID-19 infection 09/2020.    Historically, symptoms improved with Trelegy.  Escalate to [employer].  Felt to be reactive airways disease following COVID infection.  However today, she is not sure if Trelegy is help.  This is different from her prior reports.  She seems fixed on perceived side effects including of legs, sometimes her face.  We discussed I do not think this is related to Trelegy.  She was seen by her PCP and no swelling noted.  She is not swollen on exam today.  She also thinks the medicine is causing her weight gain.  Again discussed I do not think this is the case.  Discussed at length stopping medication given symptoms seem stable.  Although he is unsure how much it helped I reminded her in the past that is documented that Trelegy clearly help with cough and shortness of breath.  A lot of nasal congestion.  Present for at least 6 months.  Has not tolerated azelastine and Flonase in the past.  Using ipratropium nasal spray.  Discussed need to try Flonase again given ongoing nasal congestion.   HPI at initial visit Patient contracted COVID 09/2020.  She received antibody infusion.  She had COVID-vaccine x3 prior to infection.  Symptoms were nasal congestion, sore throat.  Mild dyspnea.  Though symptoms gradually improved except congestion in the  nasal passages has persisted.  Dyspnea exertion has worsened.  Dyspnea worse up inclines or stairs.  Can be present on flat surfaces as well.  No timing during day when things are better or worse.  No seasonal or environmental changes make things better or worse.  She did receive prednisone a few days ago and this seemed to help somewhat.  Similarly, no rhyme or reason to her cough.  No relieving or exacerbating factors that she can readily identify.  No timing during day were things are better or worse.  No seasonal or environmental changes.  With the symptoms she went to the ED 11/2020 and had a CTA chest PE protocol performed which on my interpretation reveals no evidence of PE, scattered faint groundglass opacities bilaterally throughout all lobes consistent with atypical pneumonia versus resolving COVID-19 pneumonia.  She was given doxycycline.  Per neurology notes 12/2020 symptoms improved quite a bit with this intervention.  However patient does not recall this and states nothing is really helped.  PMH: Hypertension, MS Surgical history: Reviewed reviewed with patient, she denies surgeries Family history: Reviewed with patient, she denies respiratory illnesses in first-degree relatives Social history: Never smoker, lives with husband, lives in [place] / Pulmonary Flowsheets:   ACT:  Asthma Control Test ACT Total Score  05/08/2022 11:00 AM 17   MMRC:     No  data to display          [place]:      No data to display          Tests:   FENO:  No results found for: "NITRICOXIDE"  PFT:    Latest Ref Rng & Units 02/20/2021   11:47 AM  PFT Results  FVC-Pre L 2.35   FVC-Predicted Pre % 90   FVC-Post L 2.34   FVC-Predicted Post % 90   Pre FEV1/FVC % % 87   Post FEV1/FCV % % 88   FEV1-Pre L 2.03   FEV1-Predicted Pre % 99   FEV1-Post L 2.06   DLCO uncorrected ml/min/mmHg 9.65   DLCO UNC% % 49   DLCO corrected ml/min/mmHg 9.65   DLCO COR %Predicted % 49    DLVA Predicted % 68   TLC L 3.12   TLC % Predicted % 64   RV % Predicted % 48   Personally reviewed and interpreted as normal spirometry with moderate restriction based on TLC with severely reduced DLCO.  WALK:      No data to display          Imaging: Personally reviewed and as per EMR discussion this note CT high-res 03/2021.  This demonstrated on my interpretation scattered groundglass opacities with mild bronchiectasis scattered throughout concerning for ongoing inflammation from COVID-19 with the presence of bronchiectasis developing fibrosis although there is no frank fibrosis demonstrated on the scan.  Lab Results: Personally reviewed, no significant elevation of eosinophils and no anemia CBC    Component Value Date/Time   WBC 5.7 06/14/2021 1056   RBC 4.53 06/14/2021 1056   HGB 11.0 (L) 06/14/2021 1056   HCT 34.7 06/14/2021 1056   PLT 390 06/14/2021 1056   MCV 77 (L) 06/14/2021 1056   MCH 24.3 (L) 06/14/2021 1056   MCHC 31.7 06/14/2021 1056   RDW 16.5 (H) 06/14/2021 1056   LYMPHSABS 0.9 06/14/2021 1056   EOSABS 0.1 06/14/2021 1056   BASOSABS 0.0 06/14/2021 1056    BMET    Component Value Date/Time   NA 138 07/11/2020 1343   K 4.2 07/11/2020 1343   CL 99 07/11/2020 1343   CO2 24 07/11/2020 1343   GLUCOSE 86 07/11/2020 1343   BUN 21 07/11/2020 1343   CREATININE 1.04 (H) 07/11/2020 1343   CALCIUM 10.0 07/11/2020 1343   GFRNONAA 61 07/11/2020 1343   GFRAA 70 07/11/2020 1343    BNP No results found for: "BNP"  ProBNP No results found for: "PROBNP"  Specialty Problems   None  Allergies  Allergen Reactions   Ace Inhibitors Swelling    lips   Sulfa Antibiotics Hives and Itching   Amoxicillin Diarrhea   Benadryl [Diphenhydramine] Other (See Comments)    Difficulty swallowing   Dilantin [Phenytoin Sodium Extended]    Lisinopril    Norvasc [Amlodipine Besylate]     Immunization History  Administered Date(s) Administered   Influenza,inj,Quad  PF,6+ Mos 08/13/2018   Influenza-Unspecified 08/08/2014, 07/14/2021   Zoster, Live 09/07/2013    Past Medical History:  Diagnosis Date   Anemia    Hypertension    Multiple sclerosis ([place])    Neuromuscular disorder ([place])    Vertigo    Vision abnormalities     Tobacco History: Social History   Tobacco Use  Smoking Status Never  Smokeless Tobacco Never   Counseling given: Not Answered   Continue to not smoke  Outpatient Encounter Medications as of 05/08/2022  Medication Sig  albuterol (VENTOLIN HFA) 108 (90 Base) MCG/ACT inhaler Inhale 1 puff into the lungs every 4 (four) hours.   carvedilol (COREG) 6.25 MG tablet Take 6.25 mg by mouth 2 (two) times daily with a meal.   cetirizine (ZYRTEC ALLERGY) 10 MG tablet Take 1 tablet (10 mg total) by mouth daily.   Fluticasone-Umeclidin-Vilant (TRELEGY ELLIPTA) 200-62.5-25 MCG/ACT AEPB Inhale 1 puff into the lungs daily.   Fluticasone-Umeclidin-Vilant (TRELEGY ELLIPTA) 200-62.5-25 MCG/INH AEPB Inhale 1 puff into the lungs daily.   guaiFENesin 200 MG tablet Take 1 tablet (200 mg total) by mouth every 4 (four) hours as needed for cough or to loosen phlegm.   ipratropium (ATROVENT) 0.06 % nasal spray Place 2 sprays into both nostrils 2 (two) times daily.   levothyroxine (SYNTHROID, LEVOTHROID) 50 MCG tablet Take 50 mcg by mouth daily before breakfast.   ocrelizumab 600 mg in sodium chloride 0.9 % 500 mL Inject 600 mg into the vein every 6 (six) months.   ondansetron (ZOFRAN) 4 MG tablet Take 1 tablet (4 mg total) by mouth every 8 (eight) hours as needed for nausea or vomiting.   prednisoLONE acetate (PRED MILD) 0.12 % ophthalmic suspension 1 drop 4 (four) times daily.   timolol (BETIMOL) 0.5 % ophthalmic solution Place 1 drop into the right eye daily.   traZODone (DESYREL) 50 MG tablet TAKE 1 TABLET BY MOUTH EVERYDAY AT BEDTIME   triamterene-hydrochlorothiazide (DYAZIDE) 37.5-25 MG capsule Take 1 capsule by mouth as needed.   No  facility-administered encounter medications on file as of 05/08/2022.     Review of Systems  Review of Systems  N/a Physical Exam  BP 128/70 (BP Location: Left Arm, Patient Position: Sitting, Cuff Size: Normal)   Pulse 74   Temp 98.6 F (37 C) (Oral)   Ht '5\' 3"'$  (1.6 m)   Wt 157 lb 3.2 oz (71.3 kg)   LMP 01/01/2017 (Exact Date)   BMI 27.85 kg/m   Wt Readings from Last 5 Encounters:  05/08/22 157 lb 3.2 oz (71.3 kg)  12/14/21 147 lb (66.7 kg)  12/13/21 147 lb (66.7 kg)  09/12/21 143 lb 12.8 oz (65.2 kg)  06/14/21 141 lb 12.8 oz (64.3 kg)    BMI Readings from Last 5 Encounters:  05/08/22 27.85 kg/m  12/14/21 26.04 kg/m  12/13/21 25.63 kg/m  09/12/21 26.30 kg/m  06/14/21 25.94 kg/m     Physical Exam General: Well-appearing, no acute distress Eyes: EOMI, no icterus Neck: Supple no JVP Cardiovascular: Regular rate and rhythm, no murmur Pulmonary: Clear to auscultation bilaterally, no wheeze or crackle, normal for breathing Abdomen: Nondistended, bowel sounds present MSK: No synovitis, no joint effusion Neuro: Normal gait, no weakness Psych: Normal mood, full affect   Assessment & Plan:   Cough: Overall improved.  Likely multifactorial but mostly related to nasal congestion new since COVID infection.  Seems improved with Trelegy as well as ipratropium nasal spray.  Medication changes as below  Presumed asthma: Triggered by COVID-19 infection.  Breo without much improvement.  Trelegy did yield improvement in breathing and dyspnea on exertion as well as cough.  Now she is unsure.  Worried about side effects of weight gain and swelling.  I do not think these are related to Trelegy.  Okay to stop Trelegy at this time.  If things worsen resume Trelegy.  Nasal congestion, rhinitis: Continue ipratropium.  Add Flonase 1 spray each nostril twice daily.    Return in about 4 weeks (around 06/05/2022).   Lanier Clam, MD 05/08/2022

## 2022-05-23 ENCOUNTER — Telehealth: Payer: Self-pay | Admitting: Pulmonary Disease

## 2022-05-23 NOTE — Telephone Encounter (Signed)
Fax received from Dr. Marlin Canary with [facility] to perform a LEFT KNEE ARTHROPLASTY on patient.  Patient needs surgery clearance. Surgery is PENDING. Patient was seen on 05/08/2022. Office protocol is a risk assessment can be sent to surgeon if patient has been seen in 60 days or less.   Sending to Dr Silas Flood for risk assessment or recommendations if patient needs to be seen in office prior to surgical procedure.

## 2022-05-30 NOTE — Telephone Encounter (Signed)
Called and spoke with Wells Guiles at [facility] and explained to her that we are waiting for Dr Silas Flood to look at patients chart and then I will send over pre op instructions. Nothing further.   Message already sent to [facility] on this patient.

## 2022-05-30 NOTE — Telephone Encounter (Signed)
Pulmonary medicine does not provide preoperative clearance but rather a preoperative risk assessment.  Based on the ARISCAT model, patient is low or 1.3% risk of postoperative pulmonary complication assuming duration of surgery is less than 3 hours.  If duration of surgery is greater than 3 hours, patient is intermediate or 13.3% risk of postoperative pulmonary complication.  There are no modifiable risk factors to address prior to surgery.

## 2022-05-31 NOTE — Telephone Encounter (Signed)
OV notes and clearance form have been faxed back to [facility]. Nothing further needed at this time.  

## 2022-06-13 ENCOUNTER — Other Ambulatory Visit: Payer: Self-pay | Admitting: Orthopaedic Surgery

## 2022-06-15 ENCOUNTER — Ambulatory Visit: Payer: 59 | Admitting: Pulmonary Disease

## 2022-06-15 ENCOUNTER — Encounter: Payer: Self-pay | Admitting: Pulmonary Disease

## 2022-06-15 VITALS — BP 132/68 | HR 106 | Wt 159.4 lb

## 2022-06-15 DIAGNOSIS — J4540 Moderate persistent asthma, uncomplicated: Secondary | ICD-10-CM | POA: Diagnosis not present

## 2022-06-15 DIAGNOSIS — R053 Chronic cough: Secondary | ICD-10-CM | POA: Diagnosis not present

## 2022-06-15 MED ORDER — ALBUTEROL SULFATE HFA 108 (90 BASE) MCG/ACT IN AERS: 2.0000 | INHALATION_SPRAY | RESPIRATORY_TRACT | 11 refills | Status: DC | PRN

## 2022-06-15 NOTE — Patient Instructions (Signed)
Nice to see you again  I recommend resuming Trelegy 1 puff daily.  Rinse your mouth out with water after use  I sent a refill in for a rescue inhaler.  Please keep this with you at all times.  Use 2 puffs every 4 hours as needed, you do not need to use it all the time but just when you feel short of breath or wheezy.  Return to clinic in 4 weeks or sooner as needed with Dr. Silas Flood

## 2022-06-15 NOTE — Progress Notes (Signed)
$'@Patient'L$  ID: Pamela Potter, female    DOB: January 06, 1966, 56 y.o.   MRN: [id number]  Chief Complaint  Patient presents with   Follow-up    Pt is here for follow up for asthma. Pt states she thinks its her blood pressure meds that is causing her to have swelling, swelling all over, pins and needles sensation. Pt stopped the inhaler for a minute because she thought it was the inhaler causing this side effect. But now she has mucus building up again.     Referring provider: Shirline Frees, MD  HPI:   56 y.o. whom we are seeing in follow-up for evaluation of dyspnea on exertion and cough since COVID-19 infection 09/2020.    She returns today.  Cough has worsened with productive phlegm over the last few weeks.  She stopped Trelegy due to presumed side effects of swelling.  I told her that I think this was unknown to describe side effect but agreed to stop it anyway.  She continues to have swelling and pins and needle sensation.  After discussion we agreed this is not due to Trelegy given timeframe of ongoing symptoms despite stopping this medication.  Advised resuming Trelegy to help with the cough as well as reduce chance of exacerbation in the future that may require prednisone or hospitalization.  I provided albuterol rescue inhaler today and provided education regarding the use and need for this.   HPI at initial visit Patient contracted COVID 09/2020.  She received antibody infusion.  She had COVID-vaccine x3 prior to infection.  Symptoms were nasal congestion, sore throat.  Mild dyspnea.  Though symptoms gradually improved except congestion in the nasal passages has persisted.  Dyspnea exertion has worsened.  Dyspnea worse up inclines or stairs.  Can be present on flat surfaces as well.  No timing during day when things are better or worse.  No seasonal or environmental changes make things better or worse.  She did receive prednisone a few days ago and this seemed to help somewhat.  Similarly,  no rhyme or reason to her cough.  No relieving or exacerbating factors that she can readily identify.  No timing during day were things are better or worse.  No seasonal or environmental changes.  With the symptoms she went to the ED 11/2020 and had a CTA chest PE protocol performed which on my interpretation reveals no evidence of PE, scattered faint groundglass opacities bilaterally throughout all lobes consistent with atypical pneumonia versus resolving COVID-19 pneumonia.  She was given doxycycline.  Per neurology notes 12/2020 symptoms improved quite a bit with this intervention.  However patient does not recall this and states nothing is really helped.  PMH: Hypertension, MS Surgical history: Reviewed reviewed with patient, she denies surgeries Family history: Reviewed with patient, she denies respiratory illnesses in first-degree relatives Social history: Never smoker, lives with husband, lives in [place] / Pulmonary Flowsheets:   ACT:  Asthma Control Test ACT Total Score  05/08/2022 11:00 AM 17   MMRC:     No data to display           Epworth:      No data to display           Tests:   FENO:  No results found for: "NITRICOXIDE"  PFT:    Latest Ref Rng & Units 02/20/2021   11:47 AM  PFT Results  FVC-Pre L 2.35   FVC-Predicted Pre % 90   FVC-Post L 2.34  FVC-Predicted Post % 90   Pre FEV1/FVC % % 87   Post FEV1/FCV % % 88   FEV1-Pre L 2.03   FEV1-Predicted Pre % 99   FEV1-Post L 2.06   DLCO uncorrected ml/min/mmHg 9.65   DLCO UNC% % 49   DLCO corrected ml/min/mmHg 9.65   DLCO COR %Predicted % 49   DLVA Predicted % 68   TLC L 3.12   TLC % Predicted % 64   RV % Predicted % 48   Personally reviewed and interpreted as normal spirometry with moderate restriction based on TLC with severely reduced DLCO.  WALK:      No data to display           Imaging: Personally reviewed and as per EMR discussion this note CT high-res 03/2021.   This demonstrated on my interpretation scattered groundglass opacities with mild bronchiectasis scattered throughout concerning for ongoing inflammation from COVID-19 with the presence of bronchiectasis developing fibrosis although there is no frank fibrosis demonstrated on the scan.  Lab Results: Personally reviewed, no significant elevation of eosinophils and no anemia CBC    Component Value Date/Time   WBC 5.7 06/14/2021 1056   RBC 4.53 06/14/2021 1056   HGB 11.0 (L) 06/14/2021 1056   HCT 34.7 06/14/2021 1056   PLT 390 06/14/2021 1056   MCV 77 (L) 06/14/2021 1056   MCH 24.3 (L) 06/14/2021 1056   MCHC 31.7 06/14/2021 1056   RDW 16.5 (H) 06/14/2021 1056   LYMPHSABS 0.9 06/14/2021 1056   EOSABS 0.1 06/14/2021 1056   BASOSABS 0.0 06/14/2021 1056    BMET    Component Value Date/Time   NA 138 07/11/2020 1343   K 4.2 07/11/2020 1343   CL 99 07/11/2020 1343   CO2 24 07/11/2020 1343   GLUCOSE 86 07/11/2020 1343   BUN 21 07/11/2020 1343   CREATININE 1.04 (H) 07/11/2020 1343   CALCIUM 10.0 07/11/2020 1343   GFRNONAA 61 07/11/2020 1343   GFRAA 70 07/11/2020 1343    BNP No results found for: "BNP"  ProBNP No results found for: "PROBNP"  Specialty Problems   None  Allergies  Allergen Reactions   Ace Inhibitors Swelling    lips   Sulfa Antibiotics Hives and Itching   Amoxicillin Diarrhea   Benadryl [Diphenhydramine] Other (See Comments)    Difficulty swallowing   Dilantin [Phenytoin Sodium Extended]    Lisinopril    Norvasc [Amlodipine Besylate]     Immunization History  Administered Date(s) Administered   Influenza,inj,Quad PF,6+ Mos 08/13/2018   Influenza-Unspecified 08/08/2014, 07/14/2021   Zoster, Live 09/07/2013    Past Medical History:  Diagnosis Date   Anemia    Hypertension    Multiple sclerosis ([place])    Neuromuscular disorder ([place])    Vertigo    Vision abnormalities     Tobacco History: Social History   Tobacco Use  Smoking Status Never   Smokeless Tobacco Never   Counseling given: Not Answered   Continue to not smoke  Outpatient Encounter Medications as of 06/15/2022  Medication Sig   cetirizine (ZYRTEC ALLERGY) 10 MG tablet Take 1 tablet (10 mg total) by mouth daily.   Fluticasone-Umeclidin-Vilant (TRELEGY ELLIPTA) 200-62.5-25 MCG/ACT AEPB Inhale 1 puff into the lungs daily.   Fluticasone-Umeclidin-Vilant (TRELEGY ELLIPTA) 200-62.5-25 MCG/INH AEPB Inhale 1 puff into the lungs daily.   guaiFENesin 200 MG tablet Take 1 tablet (200 mg total) by mouth every 4 (four) hours as needed for cough or to loosen phlegm.   ipratropium (ATROVENT)  0.06 % nasal spray Place 2 sprays into both nostrils 2 (two) times daily.   levothyroxine (SYNTHROID, LEVOTHROID) 50 MCG tablet Take 50 mcg by mouth daily before breakfast.   ocrelizumab 600 mg in sodium chloride 0.9 % 500 mL Inject 600 mg into the vein every 6 (six) months.   prednisoLONE acetate (PRED MILD) 0.12 % ophthalmic suspension 1 drop 4 (four) times daily.   timolol (BETIMOL) 0.5 % ophthalmic solution Place 1 drop into the right eye daily.   triamterene-hydrochlorothiazide (DYAZIDE) 37.5-25 MG capsule Take 1 capsule by mouth as needed.   [DISCONTINUED] albuterol (VENTOLIN HFA) 108 (90 Base) MCG/ACT inhaler Inhale 1 puff into the lungs every 4 (four) hours.   albuterol (VENTOLIN HFA) 108 (90 Base) MCG/ACT inhaler Inhale 2 puffs into the lungs every 4 (four) hours as needed for wheezing or shortness of breath.   carvedilol (COREG) 6.25 MG tablet Take 6.25 mg by mouth 2 (two) times daily with a meal. (Patient not taking: Reported on 06/15/2022)   [DISCONTINUED] ondansetron (ZOFRAN) 4 MG tablet Take 1 tablet (4 mg total) by mouth every 8 (eight) hours as needed for nausea or vomiting.   [DISCONTINUED] traZODone (DESYREL) 50 MG tablet TAKE 1 TABLET BY MOUTH EVERYDAY AT BEDTIME   No facility-administered encounter medications on file as of 06/15/2022.     Review of Systems  Review of  Systems  N/a Physical Exam  BP 132/68 (BP Location: Left Arm, Patient Position: Sitting, Cuff Size: Normal)   Pulse (!) 106   Wt 159 lb 6.4 oz (72.3 kg)   LMP 01/01/2017 (Exact Date)   SpO2 98%   BMI 28.24 kg/m   Wt Readings from Last 5 Encounters:  06/15/22 159 lb 6.4 oz (72.3 kg)  05/08/22 157 lb 3.2 oz (71.3 kg)  12/14/21 147 lb (66.7 kg)  12/13/21 147 lb (66.7 kg)  09/12/21 143 lb 12.8 oz (65.2 kg)    BMI Readings from Last 5 Encounters:  06/15/22 28.24 kg/m  05/08/22 27.85 kg/m  12/14/21 26.04 kg/m  12/13/21 25.63 kg/m  09/12/21 26.30 kg/m     Physical Exam General: Well-appearing, no acute distress Eyes: EOMI, no icterus Neck: Supple no JVP Cardiovascular: Regular rate and rhythm, no murmur Pulmonary: Clear to auscultation bilaterally, no wheeze or crackle, normal for breathing Abdomen: Nondistended, bowel sounds present MSK: No synovitis, no joint effusion Neuro: Normal gait, no weakness Psych: Normal mood, full affect   Assessment & Plan:   Cough: Now worse after stopping Trelegy.  Resume Trelegy.  Presumed asthma: Triggered by COVID-19 infection.  Breo without much improvement.  Trelegy did yield improvement in breathing and dyspnea on exertion as well as cough.  Have encouraged her to continue this as maintenance therapy.  Rescue inhaler, albuterol, prescribed today  Nasal congestion, rhinitis: Continue ipratropium.  She did not start Flonase as prescribed as she thinks it made her stop breathing in the past for about 1 second every time she used it.   Return in about 4 weeks (around 07/13/2022).   Lanier Clam, MD 06/15/2022

## 2022-06-19 NOTE — Patient Instructions (Addendum)
Below is our plan:  We will continue Ocrevus. I will update labs. Please continue close follow up with your primary care. Let him know about 10lb weight gain since stopping triamterene/HCTZ. Consider compression stockings. Walking may help. Avoid sodium in the diet and stay well hydrated. I will order updated MRI of cervical spine and brain.   Please make sure you are staying well hydrated. I recommend 50-60 ounces daily. Well balanced diet and regular exercise encouraged. Consistent sleep schedule with 6-8 hours recommended.   Please continue follow up with care team as directed.   Follow up with Dr Felecia Shelling in 6 months.   You may receive a survey regarding today's visit. I encourage you to leave honest feed back as I do use this information to improve patient care. Thank you for seeing me today!

## 2022-06-19 NOTE — Progress Notes (Signed)
Chief Complaint  Patient presents with   Follow-up    Pt in rm #2 and alone. Pt state she has been gaining weight lately. Pt state she has swelling in both feet.    HISTORY OF PRESENT ILLNESS:  06/21/22 ALL:  Pamela Potter is a 56 y.o. female here today for follow up for RRMS. She continues Ocrevus infusions. Last infusion in April, next scheduled in October. Last brain and cervical MRI were stable 02/2020.   She feels that she is fairly stable. She has had some swelling of bilateral feet and legs for the past 2 months. She reports a pin and needle sensation from head to toe started at the same time. She has gained about 20lbs since last visit with [name] 12/2021. She was seen by pulmonology. She felt Trelegy inhaler may be causing swelling so she stopped. This has not helped. She reports PCP adjusted her BP meds last week. He stopped triamterene/HCTZ. She reports gaining 10lbs since. She denies chest pain or worsening shob. She is drinking about 6 eight ounce glasses of water.   Gait is stable. No changes. No weakness. No assistive device. She is planning a right knee replacement next month.   She hasn't been sleeping well, lately. She feels that she twists and turns all night. She is having some pain. She sleeps with TV on. She goes to bed around 12am, wakes at 6 to let dog out then sleeps until 11am. She feels that once she is able to get up and move around she feels ok. She does not usually need to take naps during the day. .   Mood is good. She denies depression.   Bowel/bladder habits are unchanged. She feels urinary frequency is better.She wears a pad. She is not interested in medicaitons to help with urgency.   She continues vitamin D supplements, 2000iu, OTC but admits that she is not taking consistently.   HISTORY (copied from Dr Garth Bigness previous note)  Pamela Potter is a 56 y.o. woman with relapsing remitting multiple sclerosis.  Update 12/13/2021 She is on Ocrevus.  She feels  her MS is doing ok and has no recent exacerbation or new MS symptoms.   She tolerates Ocrevus well.     Gait is stable.   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  .   She does worse in heat. She needs to hold the bannister on stairs.    She feels her right leg does worse than her left leg.       She has urinary urgency and occasional incontinence and wears pads.    She currently has a UTI - these are rare, 1st one in > 3 years.   Vision has done well.   She has keratoconus and wears special contacts.   She had OD surgery  She has daily fatigue taht has seemed worse since Covid in late 2021.       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  She often does not fall asleep for a few hours and ten sleeps in late.    She is often hot at night and has trouble getting comfortable.  She twists a lot at night even while asleep.     She has never taken trazodone or other sleep aid.  She denies depression.    She notes verbal fluency ans short term memory issues are worse.    She has right hip pain and left knee pain.  She feels bloated at times.        MS History: She was diagnosed with MS in 1991 after presenting with gait disturbance, weakness and vertigo. An MRI of the brain showed plaques and a lumbar puncture were reportedly consistent with MS. She saw Dr. Adolphus Birchwood a couple years later and was started her on Avonex.   She stayed on Avonex for about 10 years but then had new lesions on her MRI. She was referred to Dr. Jacqulynn Cadet. He placed her on Tysabri and she stayed on for a few years. 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 Unfortunately, on Gilenya, she had some MRI progression and Tysabri was restarted. . Of note, her JCV antibody titers were initially less than 0.9. However, over the last couple years her titers have increased and her last JCV antibody was 1.03. She was switched to ocrelizumab and had her first  dose the end of June 2017 and her second dose in mid July. She has not noted any exacerbations while on ocrelizumab.   DATA: MRI images from 02/10/2016 show classic T2/FLAIR hyperintense foci in the periventricular, juxtacortical and deep white matter of both hemispheres and also in the pons and right middle cerebellar peduncle in a pattern and configuration consistent with chronic demyelinating plaque associated with MS.   There were no acute findings and no change when compared to the previous MRI, from 05/17/2014  The MRI of the cervical and thoracic spine 2017 showed foci in the upper cervical spine and in the mid to lower thoracic spine. None of these were acute.      I reviewed her labs. Her JCV antibody on 12/25/2015 was 1.03 (middle positive). She had normal CD4 and CD8 cells.   Hepatitis B labs and Quantiferon TB test were negative or nonreactive.  MRI 02/12/2020 cervical spine:  Patchy T2 hyperintense foci within the upper cervical spine as described above.  None of the foci appear to be acute and there are no definite new lesions compared to the previous MRI from 2015.   Small left paramedian disc protrusion at C6-C7, not present on the 2015 MRI, that does not lead to nerve root compression or spinal stenosis  MRI 02/12/2020 Brain:  Multiple T2/FLAIR hyperintense foci in the brainstem, middle cerebellar peduncles and hemispheres in a pattern and configuration consistent with chronic demyelinating plaque associated with multiple sclerosis.  None of the foci appear to be acute.  Compared to the MRI from 01/08/2018, there are no new lesions.   REVIEW OF SYSTEMS: Out of a complete 14 system review of symptoms, the patient complains only of the following symptoms, fatigue, shortness of breath, knee pain, swelling in lower ext, paresthesias and all other reviewed systems are negative.   ALLERGIES: Allergies  Allergen Reactions   Ace Inhibitors Swelling    lips   Sulfa Antibiotics Hives and  Itching   Amoxicillin Diarrhea   Benadryl [Diphenhydramine] Other (See Comments)    Difficulty swallowing   Dilantin [Phenytoin Sodium Extended]    Lisinopril    Norvasc [Amlodipine Besylate]      HOME MEDICATIONS: Outpatient Medications Prior to Visit  Medication Sig Dispense Refill   albuterol (VENTOLIN HFA) 108 (90 Base) MCG/ACT inhaler Inhale 2 puffs into the lungs every 4 (four) hours as needed for wheezing or shortness of breath. 1 each 11   carvedilol (COREG) 6.25 MG tablet Take 6.25 mg by mouth 2 (two) times daily with  a meal.     cetirizine (ZYRTEC ALLERGY) 10 MG tablet Take 1 tablet (10 mg total) by mouth daily. 30 tablet 11   Fluticasone-Umeclidin-Vilant (TRELEGY ELLIPTA) 200-62.5-25 MCG/ACT AEPB Inhale 1 puff into the lungs daily. 60 each 4   Fluticasone-Umeclidin-Vilant (TRELEGY ELLIPTA) 200-62.5-25 MCG/INH AEPB Inhale 1 puff into the lungs daily. 60 each 11   guaiFENesin 200 MG tablet Take 1 tablet (200 mg total) by mouth every 4 (four) hours as needed for cough or to loosen phlegm. 30 suppository 0   ipratropium (ATROVENT) 0.06 % nasal spray Place 2 sprays into both nostrils 2 (two) times daily. 15 mL 12   levothyroxine (SYNTHROID, LEVOTHROID) 50 MCG tablet Take 50 mcg by mouth daily before breakfast.     ocrelizumab 600 mg in sodium chloride 0.9 % 500 mL Inject 600 mg into the vein every 6 (six) months.     prednisoLONE acetate (PRED MILD) 0.12 % ophthalmic suspension 1 drop 4 (four) times daily.     timolol (BETIMOL) 0.5 % ophthalmic solution Place 1 drop into the right eye daily.     triamterene-hydrochlorothiazide (DYAZIDE) 37.5-25 MG capsule Take 1 capsule by mouth as needed.     No facility-administered medications prior to visit.     PAST MEDICAL HISTORY: Past Medical History:  Diagnosis Date   Anemia    Hypertension    Multiple sclerosis ([place])    Neuromuscular disorder ([place])    Vertigo    Vision abnormalities      PAST SURGICAL HISTORY: Past Surgical  History:  Procedure Laterality Date   EYE SURGERY       FAMILY HISTORY: Family History  Problem Relation Age of Onset   Hyperlipidemia Mother    Hypertension Mother    Stroke Father    Diabetes Father      SOCIAL HISTORY: Social History   Socioeconomic History   Marital status: Married    Spouse name: Not on file   Number of children: Not on file   Years of education: Not on file   Highest education level: Not on file  Occupational History   Not on file  Tobacco Use   Smoking status: Never   Smokeless tobacco: Never  Substance and Sexual Activity   Alcohol use: Never    Alcohol/week: 0.0 standard drinks of alcohol   Drug use: Never   Sexual activity: Not on file  Other Topics Concern   Not on file  Social History Narrative   Not on file   Social Determinants of Health   Financial Resource Strain: Not on file  Food Insecurity: Not on file  Transportation Needs: Not on file  Physical Activity: Not on file  Stress: Not on file  Social Connections: Not on file  Intimate Partner Violence: Not on file     PHYSICAL EXAM  Vitals:   06/21/22 1039  BP: (!) 144/89  Pulse: 79  Weight: 166 lb 11.2 oz (75.6 kg)  Height: '5\' 4"'$  (1.626 m)    Body mass index is 28.61 kg/m.   Generalized: Well developed, in no acute distress  Cardiology: normal rate and rhythm, no murmur auscultated  Respiratory: clear to auscultation bilaterally    Neurological examination  Mentation: Alert oriented to time, place, history taking. Follows all commands speech and language fluent Cranial nerve II-XII: Pupils were equal round reactive to light. Extraocular movements were full, visual field were full on confrontational test. Facial sensation and strength were normal. Head turning and shoulder shrug  were normal  and symmetric. Motor: The motor testing reveals 5 over 5 strength of all 4 extremities with exception of 4/5 right foot.  Sensory: Sensory testing is intact to soft touch  on all 4 extremities. No evidence of extinction is noted.  Gait and station: Station normal. Gait is slightly wide. Tandem is unsteady.    DIAGNOSTIC DATA (LABS, IMAGING, TESTING) - I reviewed patient records, labs, notes, testing and imaging myself where available.  Lab Results  Component Value Date   WBC 5.7 06/14/2021   HGB 11.0 (L) 06/14/2021   HCT 34.7 06/14/2021   MCV 77 (L) 06/14/2021   PLT 390 06/14/2021      Component Value Date/Time   NA 138 07/11/2020 1343   K 4.2 07/11/2020 1343   CL 99 07/11/2020 1343   CO2 24 07/11/2020 1343   GLUCOSE 86 07/11/2020 1343   BUN 21 07/11/2020 1343   CREATININE 1.04 (H) 07/11/2020 1343   CALCIUM 10.0 07/11/2020 1343   PROT 6.9 07/11/2020 1343   ALBUMIN 4.6 07/11/2020 1343   AST 14 07/11/2020 1343   ALT 11 07/11/2020 1343   ALKPHOS 178 (H) 07/11/2020 1343   BILITOT 0.4 07/11/2020 1343   GFRNONAA 61 07/11/2020 1343   GFRAA 70 07/11/2020 1343   No results found for: "CHOL", "HDL", "LDLCALC", "LDLDIRECT", "TRIG", "CHOLHDL" Lab Results  Component Value Date   HGBA1C 5.8 (H) 07/11/2020   No results found for: "VITAMINB12" Lab Results  Component Value Date   TSH 1.250 07/11/2020        No data to display               No data to display           ASSESSMENT AND PLAN  56 y.o. year old female  has a past medical history of Anemia, Hypertension, Multiple sclerosis ([place]), Neuromuscular disorder ([place]), Vertigo, and Vision abnormalities. here with    Relapsing remitting multiple sclerosis ([place]) - Plan: CBC with Differential/Platelets, IgG, IgA, IgM, Vitamin D, 25-hydroxy, CMP, MR BRAIN W WO CONTRAST, MR CERVICAL SPINE W WO CONTRAST  High risk medication use  Gait disturbance  Insomnia, unspecified type  Vitamin D deficiency - Plan: Vitamin D, 25-hydroxy  Bilateral lower extremity edema  Weight gain, abnormal  Pamela Potter feels that MS symptoms are stable, overall. She will continue Ocrevus infusions ever 6  months. We will update labs, today. I will place orders for repeat MRI brain and cervical spine for monitoring. We have discussed concerns of swelling, weight gain and paresthesias. Neuro exam intact, however mild non pitting edema noted of bilateral ankles. I have encouraged her to follow up closely with PCP. Consider compression stockings and increasing activity. Adequate hydration and limiting sodium encouraged. She will seek urgent medical attention for any chest pain or worsening SHOB. She will continue focusing on healthy lifestyle habits.  Regular physical and mental activity advised. She will follow up with Dr Felecia Shelling in 6 months.    Orders Placed This Encounter  Procedures   MR BRAIN W WO CONTRAST    Standing Status:   Future    Standing Expiration Date:   06/22/2023    Order Specific Question:   If indicated for the ordered procedure, I authorize the administration of contrast media per Radiology protocol    Answer:   Yes    Order Specific Question:   What is the patient's sedation requirement?    Answer:   No Sedation    Order Specific Question:   Does  the patient have a pacemaker or implanted devices?    Answer:   No    Order Specific Question:   Preferred imaging location?    Answer:   [facility] (table limit-550lbs)   MR CERVICAL SPINE W WO CONTRAST    Needs open MRI    Standing Status:   Future    Standing Expiration Date:   06/22/2023    Order Specific Question:   If indicated for the ordered procedure, I authorize the administration of contrast media per Radiology protocol    Answer:   Yes    Order Specific Question:   What is the patient's sedation requirement?    Answer:   No Sedation    Order Specific Question:   Does the patient have a pacemaker or implanted devices?    Answer:   No    Order Specific Question:   Preferred imaging location?    Answer:   [facility] (table limit-550lbs)   CBC with Differential/Platelets   IgG, IgA, IgM   Vitamin D,  25-hydroxy   CMP      No orders of the defined types were placed in this encounter.    Pamela Presto, MSN, FNP-C 06/21/2022, 11:20 AM  [facility] [address], [place], [place] [address] [phone]

## 2022-06-21 ENCOUNTER — Encounter: Payer: Self-pay | Admitting: Family Medicine

## 2022-06-21 ENCOUNTER — Ambulatory Visit: Payer: 59 | Admitting: Family Medicine

## 2022-06-21 VITALS — BP 144/89 | HR 79 | Ht 64.0 in | Wt 166.7 lb

## 2022-06-21 DIAGNOSIS — E559 Vitamin D deficiency, unspecified: Secondary | ICD-10-CM

## 2022-06-21 DIAGNOSIS — G4700 Insomnia, unspecified: Secondary | ICD-10-CM | POA: Diagnosis not present

## 2022-06-21 DIAGNOSIS — R269 Unspecified abnormalities of gait and mobility: Secondary | ICD-10-CM

## 2022-06-21 DIAGNOSIS — R635 Abnormal weight gain: Secondary | ICD-10-CM

## 2022-06-21 DIAGNOSIS — Z79899 Other long term (current) drug therapy: Secondary | ICD-10-CM

## 2022-06-21 DIAGNOSIS — R600 Localized edema: Secondary | ICD-10-CM

## 2022-06-21 DIAGNOSIS — G35 Multiple sclerosis: Secondary | ICD-10-CM | POA: Diagnosis not present

## 2022-06-22 LAB — COMPREHENSIVE METABOLIC PANEL
ALT: 11 IU/L (ref 0–32)
AST: 14 IU/L (ref 0–40)
Albumin/Globulin Ratio: 2.2 (ref 1.2–2.2)
Albumin: 4 g/dL (ref 3.8–4.9)
Alkaline Phosphatase: 146 IU/L — ABNORMAL HIGH (ref 44–121)
BUN/Creatinine Ratio: 18 (ref 9–23)
BUN: 17 mg/dL (ref 6–24)
Bilirubin Total: 0.3 mg/dL (ref 0.0–1.2)
CO2: 24 mmol/L (ref 20–29)
Calcium: 9.6 mg/dL (ref 8.7–10.2)
Chloride: 108 mmol/L — ABNORMAL HIGH (ref 96–106)
Creatinine, Ser: 0.95 mg/dL (ref 0.57–1.00)
Globulin, Total: 1.8 g/dL (ref 1.5–4.5)
Glucose: 89 mg/dL (ref 70–99)
Potassium: 4.7 mmol/L (ref 3.5–5.2)
Sodium: 144 mmol/L (ref 134–144)
Total Protein: 5.8 g/dL — ABNORMAL LOW (ref 6.0–8.5)
eGFR: 70 mL/min/{1.73_m2} (ref >59)

## 2022-06-22 LAB — CBC WITH DIFFERENTIAL/PLATELET
Basophils Absolute: 0 10*3/uL (ref 0.0–0.2)
Basos: 1 %
EOS (ABSOLUTE): 0.2 10*3/uL (ref 0.0–0.4)
Eos: 7 %
Hematocrit: 37.2 % (ref 34.0–46.6)
Hemoglobin: 11.9 g/dL (ref 11.1–15.9)
Immature Grans (Abs): 0 10*3/uL (ref 0.0–0.1)
Immature Granulocytes: 0 %
Lymphocytes Absolute: 1.1 10*3/uL (ref 0.7–3.1)
Lymphs: 34 %
MCH: 26.9 pg (ref 26.6–33.0)
MCHC: 32 g/dL (ref 31.5–35.7)
MCV: 84 fL (ref 79–97)
Monocytes Absolute: 0.3 10*3/uL (ref 0.1–0.9)
Monocytes: 8 %
Neutrophils Absolute: 1.6 10*3/uL (ref 1.4–7.0)
Neutrophils: 50 %
Platelets: 296 10*3/uL (ref 150–450)
RBC: 4.42 x10E6/uL (ref 3.77–5.28)
RDW: 14.8 % (ref 11.7–15.4)
WBC: 3.2 10*3/uL — ABNORMAL LOW (ref 3.4–10.8)

## 2022-06-22 LAB — IGG, IGA, IGM
IgA/Immunoglobulin A, Serum: 55 mg/dL — ABNORMAL LOW (ref 87–352)
IgG (Immunoglobin G), Serum: 606 mg/dL (ref 586–1602)
IgM (Immunoglobulin M), Srm: 56 mg/dL (ref 26–217)

## 2022-06-22 LAB — VITAMIN D 25 HYDROXY (VIT D DEFICIENCY, FRACTURES): Vit D, 25-Hydroxy: 27.9 ng/mL — ABNORMAL LOW (ref 30.0–100.0)

## 2022-07-05 ENCOUNTER — Telehealth: Payer: Self-pay | Admitting: Family Medicine

## 2022-07-05 NOTE — Telephone Encounter (Signed)
Brain [employer] auth: [id number] exp. 07/05/22-08/19/22 Cervical [employer] Josem Kaufmann: [id number] exp. 07/05/22-08/19/22 sent to GI

## 2022-07-06 NOTE — Patient Instructions (Signed)
DUE TO COVID-19 ONLY TWO VISITORS  (aged 57 and older)  ARE ALLOWED TO COME WITH YOU AND STAY IN THE WAITING ROOM ONLY DURING PRE OP AND PROCEDURE.   **NO VISITORS ARE ALLOWED IN THE SHORT STAY AREA OR RECOVERY ROOM!!**  IF YOU WILL BE ADMITTED INTO THE HOSPITAL YOU ARE ALLOWED ONLY FOUR SUPPORT PEOPLE DURING VISITATION HOURS ONLY (7 AM -8PM)   The support person(s) must pass our screening, gel in and out, and wear a mask at all times, including in the patient's room. Patients must also wear a mask when staff or their support person are in the room. Visitors GUEST BADGE MUST BE WORN VISIBLY  One adult visitor may remain with you overnight and MUST be in the room by 8 P.M.     Your procedure is scheduled on: 07/17/22   Report to [facility] Main Entrance    Report to admitting at  7:30 AM   Call this number if you have problems the morning of surgery [phone]   Do not eat food :After Midnight.   After Midnight you may have the following liquids until _7:00_____ AM/  DAY OF SURGERY  Water Black Coffee (sugar ok, NO MILK/CREAM OR CREAMERS)  Tea (sugar ok, NO MILK/CREAM OR CREAMERS) regular and decaf                             Plain Jell-O (NO RED)                                           Fruit ices (not with fruit pulp, NO RED)                                     Popsicles (NO RED)                                                                  Juice: apple, WHITE grape, WHITE cranberry Sports drinks like Gatorade (NO RED)                   The day of surgery:  Drink ONE (1) Pre-Surgery Clear Ensure at  6:45 AM the morning of surgery. Drink in one sitting. Do not sip.  This drink was given to you during your hospital  pre-op appointment visit. Nothing else to drink after completing the  Pre-Surgery Clear Ensure at 7:00 AM          If you have questions, please contact your surgeon's office.     Oral Hygiene is also important to reduce your risk of infection.                                     Remember - BRUSH YOUR TEETH THE MORNING OF SURGERY WITH YOUR REGULAR TOOTHPASTE   Do NOT smoke after Midnight   Take these medicines the morning of surgery with A SIP OF WATER: Carvedilol- Coreg  Levothyroxine- Synthroid  Bring CPAP mask and tubing day of surgery.                              You may not have any metal on your body including hair pins, jewelry, and body piercing             Do not wear make-up, lotions, powders, perfumes/cologne, or deodorant  Do not wear nail polish including gel and S&S, artificial/acrylic nails, or any other type of covering on natural nails including finger and toenails. If you have artificial nails, gel coating, etc. that needs to be removed by a nail salon please have this removed prior to surgery or surgery may need to be canceled/ delayed if the surgeon/ anesthesia feels like they are unable to be safely monitored.   Do not shave  48 hours prior to surgery.     Do not bring valuables to the hospital. [place].   Contacts, dentures or bridgework may not be worn into surgery.   DO NOT [place].    Patients discharged on the day of surgery will not be allowed to drive home.  Someone NEEDS to stay with you for the first 24 hours after anesthesia.   Special Instructions: Bring a copy of your healthcare power of attorney and living will documents the day of surgery if you haven't scanned them before.              Please read over the following fact sheets you were given: IF YOU HAVE QUESTIONS ABOUT YOUR PRE-OP INSTRUCTIONS PLEASE CALL [phone]     [facility] - Preparing for Surgery Before surgery, you can play an important role.  Because skin is not sterile, your skin needs to be as free of germs as  possible.  You can reduce the number of germs on your skin by washing with CHG (chlorahexidine gluconate) soap before surgery.  CHG is an antiseptic cleaner which kills germs and bonds with the skin to continue killing germs even after washing. Please DO NOT use if you have an allergy to CHG or antibacterial soaps.  If your skin becomes reddened/irritated stop using the CHG and inform your nurse when you arrive at Short Stay. Do not shave (including legs and underarms) for at least 48 hours prior to the first CHG shower.   Please follow these instructions carefully:  1.  Shower with CHG Soap the night before surgery and the  morning of Surgery.  2.  If you choose to wash your hair, wash your hair first as usual with your  normal  shampoo.  3.  After you shampoo, rinse your hair and body thoroughly to remove the  shampoo.                            4.  Use CHG as you would any other liquid soap.  You can apply chg directly  to the skin and wash                       Gently with a scrungie or clean washcloth.  5.  Apply the CHG Soap to your body ONLY FROM THE NECK DOWN.   Do not use on face/ open  Wound or open sores. Avoid contact with eyes, ears mouth and genitals (private parts).                       Wash face,  Genitals (private parts) with your normal soap.             6.  Wash thoroughly, paying special attention to the area where your surgery  will be performed.  7.  Thoroughly rinse your body with warm water from the neck down.  8.  DO NOT shower/wash with your normal soap after using and rinsing off  the CHG Soap.                9.  Pat yourself dry with a clean towel.            10.  Wear clean pajamas.            11.  Place clean sheets on your bed the night of your first shower and do not  sleep with pets. Day of Surgery : Do not apply any lotions/deodorants the morning of surgery.  Please wear clean clothes to the hospital/surgery center.  FAILURE TO FOLLOW  THESE INSTRUCTIONS MAY RESULT IN THE CANCELLATION OF YOUR SURGERY    ________________________________________________________________________   Incentive Spirometer  An incentive spirometer is a tool that can help keep your lungs clear and active. This tool measures how well you are filling your lungs with each breath. Taking long deep breaths may help reverse or decrease the chance of developing breathing (pulmonary) problems (especially infection) following: A long period of time when you are unable to move or be active. BEFORE THE PROCEDURE  If the spirometer includes an indicator to show your best effort, your nurse or respiratory therapist will set it to a desired goal. If possible, sit up straight or lean slightly forward. Try not to slouch. Hold the incentive spirometer in an upright position. INSTRUCTIONS FOR USE  Sit on the edge of your bed if possible, or sit up as far as you can in bed or on a chair. Hold the incentive spirometer in an upright position. Breathe out normally. Place the mouthpiece in your mouth and seal your lips tightly around it. Breathe in slowly and as deeply as possible, raising the piston or the ball toward the top of the column. Hold your breath for 3-5 seconds or for as long as possible. Allow the piston or ball to fall to the bottom of the column. Remove the mouthpiece from your mouth and breathe out normally. Rest for a few seconds and repeat Steps 1 through 7 at least 10 times every 1-2 hours when you are awake. Take your time and take a few normal breaths between deep breaths. The spirometer may include an indicator to show your best effort. Use the indicator as a goal to work toward during each repetition. After each set of 10 deep breaths, practice coughing to be sure your lungs are clear. If you have an incision (the cut made at the time of surgery), support your incision when coughing by placing a pillow or rolled up towels firmly against it. Once  you are able to get out of bed, walk around indoors and cough well. You may stop using the incentive spirometer when instructed by your caregiver.  RISKS AND COMPLICATIONS Take your time so you do not get dizzy or light-headed. If you are in pain, you may need to take or ask for pain  medication before doing incentive spirometry. It is harder to take a deep breath if you are having pain. AFTER USE Rest and breathe slowly and easily. It can be helpful to keep track of a log of your progress. Your caregiver can provide you with a simple table to help with this. If you are using the spirometer at home, follow these instructions: [place] IF:  You are having difficultly using the spirometer. You have trouble using the spirometer as often as instructed. Your pain medication is not giving enough relief while using the spirometer. You develop fever of 100.5 F (38.1 C) or higher. SEEK IMMEDIATE MEDICAL CARE IF:  You cough up bloody sputum that had not been present before. You develop fever of 102 F (38.9 C) or greater. You develop worsening pain at or near the incision site. MAKE SURE YOU:  Understand these instructions. Will watch your condition. Will get help right away if you are not doing well or get worse. Document Released: 02/04/2007 Document Revised: 12/17/2011 Document Reviewed: 04/07/2007 [facility] Patient Information 2014 [place], [place].   ________________________________________________________________________

## 2022-07-09 ENCOUNTER — Ambulatory Visit: Payer: 59 | Admitting: Pulmonary Disease

## 2022-07-11 ENCOUNTER — Encounter (HOSPITAL_COMMUNITY): Payer: Self-pay

## 2022-07-11 ENCOUNTER — Encounter (HOSPITAL_COMMUNITY)
Admission: RE | Admit: 2022-07-11 | Discharge: 2022-07-11 | Disposition: A | Discharge status: Home / Self Care | Payer: 59 | Source: Ambulatory Visit | Attending: Orthopaedic Surgery | Admitting: Orthopaedic Surgery

## 2022-07-11 ENCOUNTER — Other Ambulatory Visit: Payer: Self-pay

## 2022-07-11 DIAGNOSIS — Z01818 Encounter for other preprocedural examination: Secondary | ICD-10-CM | POA: Diagnosis present

## 2022-07-11 DIAGNOSIS — I10 Essential (primary) hypertension: Secondary | ICD-10-CM | POA: Diagnosis not present

## 2022-07-11 HISTORY — DX: Prediabetes: R73.03

## 2022-07-11 HISTORY — DX: Unspecified asthma, uncomplicated: J45.909

## 2022-07-11 HISTORY — DX: Hypothyroidism, unspecified: E03.9

## 2022-07-11 LAB — BASIC METABOLIC PANEL
Anion gap: 6 (ref 5–15)
BUN: 17 mg/dL (ref 6–20)
CO2: 28 mmol/L (ref 22–32)
Calcium: 9.6 mg/dL (ref 8.9–10.3)
Chloride: 104 mmol/L (ref 98–111)
Creatinine, Ser: 0.96 mg/dL (ref 0.44–1.00)
GFR, Estimated: >60 mL/min (ref >60)
Glucose, Bld: 99 mg/dL (ref 70–99)
Potassium: 4.5 mmol/L (ref 3.5–5.1)
Sodium: 138 mmol/L (ref 135–145)

## 2022-07-11 LAB — GLUCOSE, CAPILLARY: Glucose-Capillary: 90 mg/dL (ref 70–99)

## 2022-07-11 LAB — SURGICAL PCR SCREEN
MRSA, PCR: NEGATIVE
Staphylococcus aureus: NEGATIVE

## 2022-07-11 LAB — CBC
HCT: 39.9 % (ref 36.0–46.0)
Hemoglobin: 12.7 g/dL (ref 12.0–15.0)
MCH: 27.3 pg (ref 26.0–34.0)
MCHC: 31.8 g/dL (ref 30.0–36.0)
MCV: 85.8 fL (ref 80.0–100.0)
Platelets: 296 10*3/uL (ref 150–400)
RBC: 4.65 MIL/uL (ref 3.87–5.11)
RDW: 15.1 % (ref 11.5–15.5)
WBC: 4 10*3/uL (ref 4.0–10.5)
nRBC: 0 % (ref 0.0–0.2)

## 2022-07-11 NOTE — Progress Notes (Signed)
Anesthesia note:  Bowel prep reminder:none  PCP - Dr. Rogers Blocker Cardiologist -none Other-   Chest x-ray - 09/13/21-epic EKG - 06/15/22-epic Stress Test - no ECHO - no Cardiac Cath - NA  Pacemaker/ICD device last checked:NA  Sleep Study - no CPAP -   Pt is pre diabetic-yes. CBG was 90 at PAT Fasting Blood Sugar -  Checks Blood Sugar _____  Blood Thinner:NA Blood Thinner Instructions: Aspirin Instructions: Last Dose:  Anesthesia review: yes  Patient denies shortness of breath, fever, cough and chest pain at PAT appointment Pt has inhalers that she uses daily but doesn't really feel SOB with most activities. She has MS but very little weakness lately.  Patient verbalized understanding of instructions that were given to them at the PAT appointment. Patient was also instructed that they will need to review over the PAT instructions again at home before surgery. yes

## 2022-07-12 ENCOUNTER — Ambulatory Visit
Admission: RE | Admit: 2022-07-12 | Discharge: 2022-07-12 | Disposition: A | Discharge status: Home / Self Care | Payer: 59 | Source: Ambulatory Visit | Attending: Family Medicine | Admitting: Family Medicine

## 2022-07-12 DIAGNOSIS — G35 Multiple sclerosis: Secondary | ICD-10-CM | POA: Diagnosis not present

## 2022-07-12 MED ORDER — GADOPICLENOL 0.5 MMOL/ML IV SOLN
7.0000 mL | Freq: Once | INTRAVENOUS | Status: AC | PRN
  Administered 2022-07-12: 7 mL via INTRAVENOUS

## 2022-07-16 MED ORDER — TRANEXAMIC ACID 1000 MG/10ML IV SOLN
2000.0000 mg | INTRAVENOUS | Status: DC
  Filled 2022-07-16: qty 20

## 2022-07-16 NOTE — H&P (Signed)
TOTAL KNEE ADMISSION H&P  Patient is being admitted for left total knee arthroplasty.  Subjective:  Chief Complaint:left knee pain.  HPI: Pamela Potter, 56 y.o. female, has a history of pain and functional disability in the left knee due to arthritis and has failed non-surgical conservative treatments for greater than 12 weeks to includeNSAID's and/or analgesics, corticosteriod injections, viscosupplementation injections, flexibility and strengthening excercises, use of assistive devices, weight reduction as appropriate, and activity modification.  Onset of symptoms was gradual, starting 5 years ago with gradually worsening course since that time. The patient noted no past surgery on the left knee(s).  Patient currently rates pain in the left knee(s) at 10 out of 10 with activity. Patient has night pain, worsening of pain with activity and weight bearing, pain that interferes with activities of daily living, crepitus, and joint swelling.  Patient has evidence of subchondral cysts, subchondral sclerosis, periarticular osteophytes, and joint space narrowing by imaging studies. There is no active infection.  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   Past Medical History:  Diagnosis Date   Anemia    Asthma    Hypertension    Hypothyroidism    Multiple sclerosis ([place])    Neuromuscular disorder ([place])    Pre-diabetes    Vertigo    Vision abnormalities     Past Surgical History:  Procedure Laterality Date   COLONOSCOPY  2023   EYE SURGERY  2017    Current Facility-Administered Medications  Medication Dose Route Frequency Provider Last Rate Last  Admin   [START ON 07/17/2022] tranexamic acid (CYKLOKAPRON) 2,000 mg in sodium chloride 0.9 % 50 mL Topical Application  4,098 mg Topical To OR [place] Nakayama, MD       Current Outpatient Medications  Medication Sig Dispense Refill Last Dose   albuterol (VENTOLIN HFA) 108 (90 Base) MCG/ACT inhaler Inhale 2 puffs into the lungs every 4 (four) hours as needed for wheezing or shortness of breath. 1 each 11    carvedilol (COREG) 6.25 MG tablet Take 6.25 mg by mouth 2 (two) times daily with a meal.      Fluticasone-Umeclidin-Vilant (TRELEGY ELLIPTA) 200-62.5-25 MCG/INH AEPB Inhale 1 puff into the lungs daily. 60 each 11    furosemide (LASIX) 20 MG tablet Take 20 mg by mouth 2 (two) times daily.      ipratropium (ATROVENT) 0.06 % nasal spray Place 2 sprays into both nostrils 2 (two) times daily. (Patient taking differently: Place 2 sprays into both nostrils 2 (two) times daily as needed for rhinitis.) 15 mL 12    levothyroxine (SYNTHROID, LEVOTHROID) 50 MCG tablet Take 50 mcg by mouth daily before breakfast.      ocrelizumab 600 mg in sodium chloride 0.9 % 500 mL Inject 600 mg into the vein every 6 (six) months.      prednisoLONE acetate (PRED FORTE) 1 % ophthalmic suspension Place 1 drop into the right eye 4 (four) times a week.      Saline (SIMPLY SALINE) 0.9 % AERS Place 1 spray into the nose as needed (congestion).      timolol (BETIMOL) 0.5 % ophthalmic solution Place 1 drop into the  right eye daily.      cetirizine (ZYRTEC ALLERGY) 10 MG tablet Take 1 tablet (10 mg total) by mouth daily. (Patient taking differently: Take 10 mg by mouth daily as needed for allergies.) 30 tablet 11    guaiFENesin 200 MG tablet Take 1 tablet (200 mg total) by mouth every 4 (four) hours as needed for cough or to loosen phlegm. (Patient not taking: Reported on 07/06/2022) 30 suppository 0 Not Taking   Allergies  Allergen Reactions   Ace Inhibitors Swelling    lips   Sulfa Antibiotics Hives and Itching   Amoxicillin  Diarrhea   Benadryl [Diphenhydramine] Other (See Comments)    Difficulty swallowing   Dyazide [Hydrochlorothiazide W-Triamterene] Swelling   Lisinopril Swelling   Norvasc [Amlodipine Besylate] Swelling   Dilantin [Phenytoin Sodium Extended] Hives and Rash    Social History   Tobacco Use   Smoking status: Never   Smokeless tobacco: Never  Substance Use Topics   Alcohol use: Never    Alcohol/week: 0.0 standard drinks of alcohol    Family History  Problem Relation Age of Onset   Hyperlipidemia Mother    Hypertension Mother    Stroke Father    Diabetes Father      Review of Systems  Musculoskeletal:  Positive for arthralgias.       Left knee  All other systems reviewed and are negative.   Objective:  Physical Exam Constitutional:      Appearance: Normal appearance.  HENT:     Head: Normocephalic and atraumatic.     Nose: Nose normal.     Mouth/Throat:     Pharynx: Oropharynx is clear.  Eyes:     Extraocular Movements: Extraocular movements intact.  Pulmonary:     Effort: Pulmonary effort is normal.  Abdominal:     Palpations: Abdomen is soft.  Musculoskeletal:     Cervical back: Normal range of motion.     Comments: Examination of the left knee shows range of motion from 0-130 of flexion.  She has a moderate valgus deformity.  She has 1+ crepitation.  She is tender to palpation both medially and laterally along the joint line.  Hip rotation is full without pain.  Calf is soft and nontender.  Opposite knee has full range of motion.  No valgus deformity.  No crepitation or tenderness palpation throughout.  She is neurovascularly intact distally bilaterally.    Skin:    General: Skin is warm and dry.  Neurological:     Mental Status: She is alert and oriented to person, place, and time.  Psychiatric:        Mood and Affect: Mood normal.        Behavior: Behavior normal.        Thought Content: Thought content normal.        Judgment: Judgment normal.     Vital  signs in last 24 hours:    Labs:   Estimated body mass index is 28.87 kg/m as calculated from the following:   Height as of 07/11/22: '5\' 3"'$  (1.6 m).   Weight as of 07/11/22: 73.9 kg.   Imaging Review Plain radiographs demonstrate severe degenerative joint disease of the left knee(s). The overall alignment isneutral. The bone quality appears to be good for age and reported activity level.      Assessment/Plan:  End stage primary arthritis, left knee   The patient history, physical examination, clinical judgment of the provider and imaging studies are consistent with end stage degenerative  joint disease of the left knee(s) and total knee arthroplasty is deemed medically necessary. The treatment options including medical management, injection therapy arthroscopy and arthroplasty were discussed at length. The risks and benefits of total knee arthroplasty were presented and reviewed. The risks due to aseptic loosening, infection, stiffness, patella tracking problems, thromboembolic complications and other imponderables were discussed. The patient acknowledged the explanation, agreed to proceed with the plan and consent was signed. Patient is being admitted for inpatient treatment for surgery, pain control, PT, OT, prophylactic antibiotics, VTE prophylaxis, progressive ambulation and ADL's and discharge planning. The patient is planning to be discharged home with home health services     Patient's anticipated LOS is less than 2 midnights, meeting these requirements: - Younger than 41 - Lives within 1 hour of care - Has a competent adult at home to recover with post-op recover - NO history of  - Chronic pain requiring opiods  - Diabetes  - Coronary Artery Disease  - Heart failure  - Heart attack  - Stroke  - DVT/VTE  - Cardiac arrhythmia  - Respiratory Failure/COPD  - Renal failure  - Anemia  - Advanced Liver disease

## 2022-07-16 NOTE — Anesthesia Preprocedure Evaluation (Signed)
Anesthesia Evaluation  Patient identified by MRN, date of birth, ID band Patient awake    Reviewed: Allergy & Precautions, NPO status , Patient's Chart, lab work & pertinent test results, reviewed documented beta blocker date and time   History of Anesthesia Complications Negative for: history of anesthetic complications  Airway Mallampati: II  TM Distance: >3 FB Neck ROM: Full    Dental  (+) Missing,    Pulmonary asthma ,    Pulmonary exam normal        Cardiovascular hypertension, Pt. on medications and Pt. on home beta blockers Normal cardiovascular exam     Neuro/Psych Multiple sclerosis negative psych ROS   GI/Hepatic negative GI ROS, Neg liver ROS,   Endo/Other  Hypothyroidism   Renal/GU negative Renal ROS  negative genitourinary   Musculoskeletal negative musculoskeletal ROS (+)   Abdominal   Peds  Hematology negative hematology ROS (+)   Anesthesia Other Findings Day of surgery medications reviewed with patient.  Reproductive/Obstetrics negative OB ROS                            Anesthesia Physical Anesthesia Plan  ASA: 2  Anesthesia Plan: Spinal   Post-op Pain Management: Tylenol PO (pre-op)* and Regional block*   Induction:   PONV Risk Score and Plan: 3 and Treatment may vary due to age or medical condition, Ondansetron, Propofol infusion, Dexamethasone and Midazolam  Airway Management Planned: Natural Airway and Simple Face Mask  Additional Equipment: None  Intra-op Plan:   Post-operative Plan:   Informed Consent: I have reviewed the patients History and Physical, chart, labs and discussed the procedure including the risks, benefits and alternatives for the proposed anesthesia with the patient or authorized representative who has indicated his/her understanding and acceptance.       Plan Discussed with: CRNA  Anesthesia Plan Comments:        Anesthesia  Quick Evaluation

## 2022-07-17 ENCOUNTER — Encounter (HOSPITAL_COMMUNITY): Payer: Self-pay | Admitting: Orthopaedic Surgery

## 2022-07-17 ENCOUNTER — Encounter (HOSPITAL_COMMUNITY)
Admission: RE | Disposition: A | Discharge status: Home / Self Care | Payer: Self-pay | Source: Home / Self Care | Attending: Orthopaedic Surgery

## 2022-07-17 ENCOUNTER — Ambulatory Visit (HOSPITAL_COMMUNITY): Payer: 59 | Admitting: Physician Assistant

## 2022-07-17 ENCOUNTER — Ambulatory Visit (HOSPITAL_COMMUNITY)
Admission: RE | Admit: 2022-07-17 | Discharge: 2022-07-17 | Disposition: A | Discharge status: Home / Self Care | Payer: 59 | Attending: Orthopaedic Surgery | Admitting: Orthopaedic Surgery

## 2022-07-17 ENCOUNTER — Ambulatory Visit (HOSPITAL_BASED_OUTPATIENT_CLINIC_OR_DEPARTMENT_OTHER): Payer: 59 | Admitting: Anesthesiology

## 2022-07-17 ENCOUNTER — Other Ambulatory Visit: Payer: Self-pay

## 2022-07-17 DIAGNOSIS — Z7951 Long term (current) use of inhaled steroids: Secondary | ICD-10-CM | POA: Insufficient documentation

## 2022-07-17 DIAGNOSIS — Z79890 Hormone replacement therapy (postmenopausal): Secondary | ICD-10-CM | POA: Insufficient documentation

## 2022-07-17 DIAGNOSIS — M1712 Unilateral primary osteoarthritis, left knee: Secondary | ICD-10-CM | POA: Diagnosis present

## 2022-07-17 DIAGNOSIS — G35 Multiple sclerosis: Secondary | ICD-10-CM | POA: Diagnosis not present

## 2022-07-17 DIAGNOSIS — Z01818 Encounter for other preprocedural examination: Secondary | ICD-10-CM

## 2022-07-17 DIAGNOSIS — E039 Hypothyroidism, unspecified: Secondary | ICD-10-CM | POA: Insufficient documentation

## 2022-07-17 DIAGNOSIS — J45909 Unspecified asthma, uncomplicated: Secondary | ICD-10-CM | POA: Insufficient documentation

## 2022-07-17 DIAGNOSIS — Z79899 Other long term (current) drug therapy: Secondary | ICD-10-CM | POA: Insufficient documentation

## 2022-07-17 DIAGNOSIS — I10 Essential (primary) hypertension: Secondary | ICD-10-CM | POA: Diagnosis not present

## 2022-07-17 DIAGNOSIS — Z96652 Presence of left artificial knee joint: Secondary | ICD-10-CM | POA: Insufficient documentation

## 2022-07-17 HISTORY — PX: TOTAL KNEE ARTHROPLASTY: SHX125

## 2022-07-17 SURGERY — ARTHROPLASTY, KNEE, TOTAL
Anesthesia: Spinal | Site: Knee | Laterality: Left

## 2022-07-17 MED ORDER — METHOCARBAMOL 500 MG IVPB - SIMPLE MED: 500.0000 mg | Freq: Four times a day (QID) | INTRAVENOUS | Status: DC | PRN

## 2022-07-17 MED ORDER — KETOROLAC TROMETHAMINE 15 MG/ML IJ SOLN: 15.0000 mg | Freq: Four times a day (QID) | INTRAMUSCULAR | Status: DC

## 2022-07-17 MED ORDER — LACTATED RINGERS IV BOLUS
250.0000 mL | Freq: Once | INTRAVENOUS | Status: AC
  Administered 2022-07-17: 250 mL via INTRAVENOUS

## 2022-07-17 MED ORDER — BUPIVACAINE LIPOSOME 1.3 % IJ SUSP
INTRAMUSCULAR | Status: AC
  Filled 2022-07-17: qty 20

## 2022-07-17 MED ORDER — LACTATED RINGERS IV BOLUS
500.0000 mL | Freq: Once | INTRAVENOUS | Status: AC
  Administered 2022-07-17: 500 mL via INTRAVENOUS

## 2022-07-17 MED ORDER — HYDROCODONE-ACETAMINOPHEN 5-325 MG PO TABS: 1.0000 | ORAL_TABLET | ORAL | Status: DC | PRN

## 2022-07-17 MED ORDER — PHENYLEPHRINE HCL-NACL 20-0.9 MG/250ML-% IV SOLN
INTRAVENOUS | Status: AC
  Filled 2022-07-17: qty 250

## 2022-07-17 MED ORDER — FENTANYL CITRATE PF 50 MCG/ML IJ SOSY
100.0000 ug | PREFILLED_SYRINGE | INTRAMUSCULAR | Status: DC
  Administered 2022-07-17: 50 ug via INTRAVENOUS
  Filled 2022-07-17: qty 2

## 2022-07-17 MED ORDER — CHLORHEXIDINE GLUCONATE 0.12 % MT SOLN
15.0000 mL | Freq: Once | OROMUCOSAL | Status: AC
  Administered 2022-07-17: 15 mL via OROMUCOSAL

## 2022-07-17 MED ORDER — OXYCODONE HCL 5 MG/5ML PO SOLN: 5.0000 mg | Freq: Once | ORAL | Status: AC | PRN

## 2022-07-17 MED ORDER — FENTANYL CITRATE PF 50 MCG/ML IJ SOSY
PREFILLED_SYRINGE | INTRAMUSCULAR | Status: AC
  Administered 2022-07-17: 50 ug via INTRAVENOUS
  Filled 2022-07-17: qty 2

## 2022-07-17 MED ORDER — OXYCODONE HCL 5 MG PO TABS
5.0000 mg | ORAL_TABLET | Freq: Once | ORAL | Status: AC | PRN
  Administered 2022-07-17: 5 mg via ORAL

## 2022-07-17 MED ORDER — HYDROCODONE-ACETAMINOPHEN 7.5-325 MG PO TABS: 1.0000 | ORAL_TABLET | ORAL | Status: DC | PRN

## 2022-07-17 MED ORDER — VANCOMYCIN HCL IN DEXTROSE 1-5 GM/200ML-% IV SOLN
1000.0000 mg | INTRAVENOUS | Status: AC
  Administered 2022-07-17: 1000 mg via INTRAVENOUS
  Filled 2022-07-17: qty 200

## 2022-07-17 MED ORDER — HYDROCODONE-ACETAMINOPHEN 5-325 MG PO TABS: 1.0000 | ORAL_TABLET | Freq: Four times a day (QID) | ORAL | 0 refills | Status: DC | PRN

## 2022-07-17 MED ORDER — TRANEXAMIC ACID-NACL 1000-0.7 MG/100ML-% IV SOLN
INTRAVENOUS | Status: AC
  Administered 2022-07-17: 1000 mg via INTRAVENOUS
  Filled 2022-07-17: qty 100

## 2022-07-17 MED ORDER — HYDROCODONE-ACETAMINOPHEN 7.5-325 MG PO TABS
ORAL_TABLET | ORAL | Status: AC
  Administered 2022-07-17: 1 via ORAL
  Filled 2022-07-17: qty 1

## 2022-07-17 MED ORDER — TRANEXAMIC ACID 1000 MG/10ML IV SOLN
INTRAVENOUS | Status: DC | PRN
  Administered 2022-07-17: 2000 mg via TOPICAL

## 2022-07-17 MED ORDER — ONDANSETRON HCL 4 MG/2ML IJ SOLN: 4.0000 mg | Freq: Four times a day (QID) | INTRAMUSCULAR | Status: DC | PRN

## 2022-07-17 MED ORDER — BUPIVACAINE-EPINEPHRINE (PF) 0.5% -1:200000 IJ SOLN
INTRAMUSCULAR | Status: AC
  Filled 2022-07-17: qty 30

## 2022-07-17 MED ORDER — METHOCARBAMOL 500 MG PO TABS: 500.0000 mg | ORAL_TABLET | Freq: Four times a day (QID) | ORAL | Status: DC | PRN

## 2022-07-17 MED ORDER — LACTATED RINGERS IV SOLN: INTRAVENOUS | Status: DC

## 2022-07-17 MED ORDER — MIDAZOLAM HCL 2 MG/2ML IJ SOLN
2.0000 mg | INTRAMUSCULAR | Status: DC
  Administered 2022-07-17: 1 mg via INTRAVENOUS
  Filled 2022-07-17: qty 2

## 2022-07-17 MED ORDER — PHENYLEPHRINE HCL-NACL 20-0.9 MG/250ML-% IV SOLN
INTRAVENOUS | Status: DC | PRN
  Administered 2022-07-17: 50 ug/min via INTRAVENOUS

## 2022-07-17 MED ORDER — ONDANSETRON HCL 4 MG PO TABS: 4.0000 mg | ORAL_TABLET | Freq: Four times a day (QID) | ORAL | Status: DC | PRN

## 2022-07-17 MED ORDER — CEFAZOLIN SODIUM-DEXTROSE 2-4 GM/100ML-% IV SOLN: 2.0000 g | Freq: Four times a day (QID) | INTRAVENOUS | Status: DC

## 2022-07-17 MED ORDER — ACETAMINOPHEN 500 MG PO TABS
1000.0000 mg | ORAL_TABLET | Freq: Once | ORAL | Status: AC
  Administered 2022-07-17: 1000 mg via ORAL
  Filled 2022-07-17: qty 2

## 2022-07-17 MED ORDER — BUPIVACAINE LIPOSOME 1.3 % IJ SUSP: 20.0000 mL | Freq: Once | INTRAMUSCULAR | Status: DC

## 2022-07-17 MED ORDER — SODIUM CHLORIDE 0.9 % IV SOLN
INTRAVENOUS | Status: DC | PRN
  Administered 2022-07-17: 80 mL

## 2022-07-17 MED ORDER — 0.9 % SODIUM CHLORIDE (POUR BTL) OPTIME
TOPICAL | Status: DC | PRN
  Administered 2022-07-17: 1000 mL

## 2022-07-17 MED ORDER — TRANEXAMIC ACID-NACL 1000-0.7 MG/100ML-% IV SOLN: 1000.0000 mg | Freq: Once | INTRAVENOUS | Status: AC

## 2022-07-17 MED ORDER — ORAL CARE MOUTH RINSE: 15.0000 mL | Freq: Once | OROMUCOSAL | Status: AC

## 2022-07-17 MED ORDER — BUPIVACAINE-EPINEPHRINE (PF) 0.5% -1:200000 IJ SOLN
INTRAMUSCULAR | Status: DC | PRN
  Administered 2022-07-17: 15 mL via PERINEURAL

## 2022-07-17 MED ORDER — PROPOFOL 1000 MG/100ML IV EMUL
INTRAVENOUS | Status: AC
  Filled 2022-07-17: qty 100

## 2022-07-17 MED ORDER — OXYCODONE HCL 5 MG PO TABS
ORAL_TABLET | ORAL | Status: AC
  Filled 2022-07-17: qty 1

## 2022-07-17 MED ORDER — CEFAZOLIN SODIUM-DEXTROSE 2-4 GM/100ML-% IV SOLN
INTRAVENOUS | Status: AC
  Filled 2022-07-17: qty 100

## 2022-07-17 MED ORDER — CLONIDINE HCL (ANALGESIA) 100 MCG/ML EP SOLN
EPIDURAL | Status: DC | PRN
  Administered 2022-07-17: 100 ug

## 2022-07-17 MED ORDER — MEPIVACAINE HCL (PF) 2 % IJ SOLN
INTRAMUSCULAR | Status: DC | PRN
  Administered 2022-07-17: 3 mL via EPIDURAL

## 2022-07-17 MED ORDER — AMISULPRIDE (ANTIEMETIC) 5 MG/2ML IV SOLN: 10.0000 mg | Freq: Once | INTRAVENOUS | Status: DC | PRN

## 2022-07-17 MED ORDER — PROPOFOL 500 MG/50ML IV EMUL
INTRAVENOUS | Status: DC | PRN
  Administered 2022-07-17: 50 ug/kg/min via INTRAVENOUS

## 2022-07-17 MED ORDER — FENTANYL CITRATE PF 50 MCG/ML IJ SOSY: 25.0000 ug | PREFILLED_SYRINGE | INTRAMUSCULAR | Status: DC | PRN

## 2022-07-17 MED ORDER — DEXAMETHASONE SODIUM PHOSPHATE 10 MG/ML IJ SOLN
INTRAMUSCULAR | Status: DC | PRN
  Administered 2022-07-17: 10 mg via INTRAVENOUS

## 2022-07-17 MED ORDER — PROPOFOL 10 MG/ML IV BOLUS
INTRAVENOUS | Status: DC | PRN
  Administered 2022-07-17: 30 mg via INTRAVENOUS

## 2022-07-17 MED ORDER — SODIUM CHLORIDE 0.9 % IR SOLN
Status: DC | PRN
  Administered 2022-07-17: 3000 mL

## 2022-07-17 MED ORDER — TIZANIDINE HCL 4 MG PO TABS: 4.0000 mg | ORAL_TABLET | Freq: Four times a day (QID) | ORAL | 1 refills | Status: DC | PRN

## 2022-07-17 MED ORDER — METHOCARBAMOL 500 MG IVPB - SIMPLE MED
INTRAVENOUS | Status: AC
  Administered 2022-07-17: 500 mg via INTRAVENOUS
  Filled 2022-07-17: qty 55

## 2022-07-17 MED ORDER — ONDANSETRON HCL 4 MG/2ML IJ SOLN
INTRAMUSCULAR | Status: AC
  Filled 2022-07-17: qty 2

## 2022-07-17 MED ORDER — CEFAZOLIN SODIUM-DEXTROSE 2-3 GM-%(50ML) IV SOLR
INTRAVENOUS | Status: DC | PRN
  Administered 2022-07-17: 2 g via INTRAVENOUS

## 2022-07-17 MED ORDER — TRANEXAMIC ACID-NACL 1000-0.7 MG/100ML-% IV SOLN
1000.0000 mg | INTRAVENOUS | Status: AC
  Administered 2022-07-17: 1000 mg via INTRAVENOUS
  Filled 2022-07-17: qty 100

## 2022-07-17 MED ORDER — ONDANSETRON HCL 4 MG/2ML IJ SOLN
INTRAMUSCULAR | Status: DC | PRN
  Administered 2022-07-17: 4 mg via INTRAVENOUS

## 2022-07-17 MED ORDER — POVIDONE-IODINE 10 % EX SWAB
2.0000 | Freq: Once | CUTANEOUS | Status: AC
  Administered 2022-07-17: 2 via TOPICAL

## 2022-07-17 MED ORDER — SODIUM CHLORIDE (PF) 0.9 % IJ SOLN
INTRAMUSCULAR | Status: AC
  Filled 2022-07-17: qty 30

## 2022-07-17 MED ORDER — DEXAMETHASONE SODIUM PHOSPHATE 10 MG/ML IJ SOLN
INTRAMUSCULAR | Status: AC
  Filled 2022-07-17: qty 1

## 2022-07-17 SURGICAL SUPPLY — 50 items
ATTUNE MED DOME PAT 32 KNEE (Knees) IMPLANT
ATTUNE PS FEM LT SZ 3 CEM KNEE (Femur) IMPLANT
ATTUNE PSRP INSR SZ3 5 KNEE (Insert) IMPLANT
BAG COUNTER SPONGE SURGICOUNT (BAG) ×1 IMPLANT
BAG DECANTER FOR FLEXI CONT (MISCELLANEOUS) ×1 IMPLANT
BAG ZIPLOCK 12X15 (MISCELLANEOUS) ×1 IMPLANT
BASEPLATE TIBIAL ROTATING SZ 4 (Knees) IMPLANT
BLADE SAGITTAL 25.0X1.19X90 (BLADE) ×1 IMPLANT
BLADE SAW SGTL 11.0X1.19X90.0M (BLADE) ×1 IMPLANT
BLADE SURG SZ10 CARB STEEL (BLADE) ×1 IMPLANT
BNDG ELASTIC 6X5.8 VLCR STR LF (GAUZE/BANDAGES/DRESSINGS) ×1 IMPLANT
BOOTIES KNEE HIGH SLOAN (MISCELLANEOUS) ×1 IMPLANT
BOWL SMART MIX CTS (DISPOSABLE) ×1 IMPLANT
CEMENT HV SMART SET (Cement) ×2 IMPLANT
COVER SURGICAL LIGHT HANDLE (MISCELLANEOUS) ×1 IMPLANT
CUFF TOURN SGL QUICK 34 (TOURNIQUET CUFF) ×1
CUFF TRNQT CYL 34X4.125X (TOURNIQUET CUFF) ×1 IMPLANT
DRAPE INCISE IOBAN 66X45 STRL (DRAPES) ×1 IMPLANT
DRAPE SHEET LG 3/4 BI-LAMINATE (DRAPES) ×1 IMPLANT
DRAPE TOP 10253 STERILE (DRAPES) ×1 IMPLANT
DRAPE U-SHAPE 47X51 STRL (DRAPES) ×1 IMPLANT
DRSG AQUACEL AG ADV 3.5X10 (GAUZE/BANDAGES/DRESSINGS) ×1 IMPLANT
DURAPREP 26ML APPLICATOR (WOUND CARE) ×2 IMPLANT
ELECT REM PT RETURN 15FT ADLT (MISCELLANEOUS) ×1 IMPLANT
GLOVE BIO SURGEON STRL SZ8 (GLOVE) ×2 IMPLANT
GLOVE BIOGEL PI IND STRL 8 (GLOVE) ×2 IMPLANT
GOWN STRL REUS W/ TWL XL LVL3 (GOWN DISPOSABLE) ×2 IMPLANT
GOWN STRL REUS W/TWL XL LVL3 (GOWN DISPOSABLE) ×2
HANDPIECE INTERPULSE COAX TIP (DISPOSABLE) ×1
HOLDER FOLEY CATH W/STRAP (MISCELLANEOUS) IMPLANT
HOOD PEEL AWAY FLYTE STAYCOOL (MISCELLANEOUS) ×3 IMPLANT
KIT TURNOVER KIT A (KITS) IMPLANT
MANIFOLD NEPTUNE II (INSTRUMENTS) ×1 IMPLANT
NEEDLE HYPO 22GX1.5 SAFETY (NEEDLE) ×1 IMPLANT
NS IRRIG 1000ML POUR BTL (IV SOLUTION) ×1 IMPLANT
PACK TOTAL KNEE CUSTOM (KITS) ×1 IMPLANT
PAD ARMBOARD 7.5X6 YLW CONV (MISCELLANEOUS) ×1 IMPLANT
PROTECTOR NERVE ULNAR (MISCELLANEOUS) ×1 IMPLANT
SET HNDPC FAN SPRY TIP SCT (DISPOSABLE) ×1 IMPLANT
SPIKE FLUID TRANSFER (MISCELLANEOUS) ×2 IMPLANT
SUT ETHIBOND NAB CT1 #1 30IN (SUTURE) ×2 IMPLANT
SUT VIC AB 0 CT1 36 (SUTURE) ×1 IMPLANT
SUT VIC AB 2-0 CT1 27 (SUTURE) ×1
SUT VIC AB 2-0 CT1 TAPERPNT 27 (SUTURE) ×1 IMPLANT
SUT VICRYL AB 3-0 FS1 BRD 27IN (SUTURE) ×1 IMPLANT
SUT VLOC 180 0 24IN GS25 (SUTURE) ×1 IMPLANT
TRAY FOLEY MTR SLVR 16FR STAT (SET/KITS/TRAYS/PACK) IMPLANT
WATER STERILE IRR 1000ML POUR (IV SOLUTION) ×1 IMPLANT
WRAP KNEE MAXI GEL POST OP (GAUZE/BANDAGES/DRESSINGS) ×1 IMPLANT
YANKAUER SUCT BULB TIP NO VENT (SUCTIONS) ×1 IMPLANT

## 2022-07-17 NOTE — Anesthesia Procedure Notes (Signed)
Spinal  Patient location during procedure: OR Start time: 07/17/2022 10:03 AM End time: 07/17/2022 10:06 AM Reason for block: surgical anesthesia Staffing Performed: resident/CRNA  Anesthesiologist: Brennan Bailey, MD Resident/CRNA: Lind Covert, CRNA Performed by: Lind Covert, CRNA Authorized by: Brennan Bailey, MD   Preanesthetic Checklist Completed: patient identified, IV checked, site marked, risks and benefits discussed, surgical consent, monitors and equipment checked, pre-op evaluation and timeout performed Spinal Block Patient position: sitting Prep: DuraPrep Patient monitoring: heart rate, cardiac monitor, continuous pulse ox and blood pressure Approach: midline Location: L3-4 Injection technique: single-shot Needle Needle type: Pencan  Needle gauge: 24 G Needle length: 10 cm Needle insertion depth: 7 cm Assessment Sensory level: T6 Events: CSF return Additional Notes Timeout performed. Patient in sitting position. L3-4 identified. Site cleansed with duraprep. SAB without difficulty. To Supine position.

## 2022-07-17 NOTE — Op Note (Signed)
PREOP DIAGNOSIS: DJD LEFT KNEE POSTOP DIAGNOSIS:  same PROCEDURE: LEFT TKR ANESTHESIA: Spinal and MAC ATTENDING SURGEON: Hessie Dibble ASSISTANT: Loni Dolly PA  INDICATIONS FOR PROCEDURE: Pamela Potter is a 56 y.o. female who has struggled for a long time with pain due to degenerative arthritis of the left knee.  The patient has failed many conservative non-operative measures and at this point has pain which limits the ability to sleep and walk.  The patient is offered total knee replacement.  Informed operative consent was obtained after discussion of possible risks of anesthesia, infection, neurovascular injury, DVT, and death.  The importance of the post-operative rehabilitation protocol to optimize result was stressed extensively with the patient.  SUMMARY OF FINDINGS AND PROCEDURE:  Pamela Potter was taken to the operative suite where under the above anesthesia a left knee replacement was performed.  There were advanced degenerative changes and the bone quality was fair.  We used the DePuyAttune system and placed size 3 femur, 4 tibia, 32 mm all polyethylene patella, and a size 5 mm spacer.  Loni Dolly PA-C assisted throughout and was invaluable to the completion of the case in that he helped retract and maintain exposure while I placed the components.  He also helped close thereby minimizing OR time.  The patient was admitted for appropriate post-op care to include perioperative antibiotics and mechanical and pharmacologic measures for DVT prophylaxis.  DESCRIPTION OF PROCEDURE:  Pamela Potter was taken to the operative suite where the above anesthesia was applied.  The patient was positioned supine and prepped and draped in normal sterile fashion.  An appropriate time out was performed.  After the administration of vancomycin and kefzol pre-op antibiotic the leg was elevated and exsanguinated and a tourniquet inflated.  A standard longitudinal incision was made on the anterior knee.   Dissection was carried down to the extensor mechanism.  All appropriate anti-infective measures were used including the pre-operative antibiotic, betadine impregnated drape, and closed hooded exhaust systems for each member of the surgical team.  A medial parapatellar incision was made in the extensor mechanism and the knee cap flipped and the knee flexed.  Some residual meniscal tissues were removed along with any remaining ACL/PCL tissue.  A guide was placed on the tibia and a flat cut was made on it's superior surface.  An intramedullary guide was placed in the femur and was utilized to make anterior and posterior cuts creating an appropriate flexion gap.  A second intramedullary guide was placed in the femur to make a distal cut properly balancing the knee with an extension gap equal to the flexion gap.  The three bones sized to the above mentioned sizes and the appropriate guides were placed and utilized.  A trial reduction was done and the knee easily came to full extension and the patella tracked well on flexion.  The trial components were removed and all bones were cleaned with pulsatile lavage and then dried thoroughly.  Cement was mixed and was pressurized onto the bones followed by placement of the aforementioned components.  Excess cement was trimmed and pressure was held on the components until the cement had hardened.  The tourniquet was deflated and a small amount of bleeding was controlled with cautery and pressure.  The knee was irrigated thoroughly.  The extensor mechanism was re-approximated with #1 ethibond in interrupted fashion.  The knee was flexed and the repair was solid.  The subcutaneous tissues were re-approximated with #0 and #2-0 vicryl and  the skin closed with a subcuticular stitch and steristrips.  A sterile dressing was applied.  Intraoperative fluids, EBL, and tourniquet time can be obtained from anesthesia records.  DISPOSITION:  The patient was taken to recovery room in stable  condition and scheduled to potentially go home same day depending on ability to walk and tolerate liquids.  Hessie Dibble 07/17/2022, 11:33 AM

## 2022-07-17 NOTE — Interval H&P Note (Signed)
History and Physical Interval Note:  07/17/2022 9:11 AM  Pamela Potter  has presented today for surgery, with the diagnosis of LEFT KNEE DEGENERATIVE JOINT DISEASE.  The various methods of treatment have been discussed with the patient and family. After consideration of risks, benefits and other options for treatment, the patient has consented to  Procedure(s): LEFT TOTAL KNEE ARTHROPLASTY (Left) as a surgical intervention.  The patient's history has been reviewed, patient examined, no change in status, stable for surgery.  I have reviewed the patient's chart and labs.  Questions were answered to the patient's satisfaction.     Hessie Dibble

## 2022-07-17 NOTE — Progress Notes (Signed)
Pt discharged in NAD, VSS, pain tolerable. Pt able to void and tolerate liquids. Dressing dry and intact. Discharge instructions given to pt and husband. Pt given a walker upon request. Pt discharged home with husband.

## 2022-07-17 NOTE — Evaluation (Signed)
Physical Therapy Evaluation Patient Details Name: Pamela Potter MRN: [id number] DOB: 12/09/1965 Today's Date: 07/17/2022  History of Present Illness  Pt is a 56yo female presenting s/p L-TKA on 07/17/22. PMH: anemia, HTN, hypothyroidism, MS, DM, vertigo.   Clinical Impression  Pamela Potter is a 56 y.o. female POD 0 s/p L-TKA. 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 Patient is now limited by functional impairments (see PT problem list below) and requires min guard for transfers. Patient was able to ambulate 100 feet with RW and min guard progressed to supervision and cues for safe walker management; husband provided some min guard via gait belt with minimal cuing and safe technique. Patient educated on safe sequencing for stair mobility and verbalized safe guarding position for people assisting with mobility. Educated pt on staying downstairs for the first few days during recovery, pt verbalized understanding. Patient instructed in exercises to facilitate ROM and circulation. Patient will benefit from continued skilled PT interventions to address impairments and progress towards PLOF. Patient has met mobility goals at adequate level for discharge home; will continue to follow if pt continues acute stay to progress towards Mod I goals.       Recommendations for follow up therapy are one component of a multi-disciplinary discharge planning process, led by the attending physician.  Recommendations may be updated based on patient status, additional functional criteria and insurance authorization.  Follow Up Recommendations Follow physician's recommendations for discharge plan and follow up therapies      Assistance Recommended at Discharge Frequent or constant Supervision/Assistance  Patient can return home with the following  A little help with walking and/or transfers;A little help with bathing/dressing/bathroom;Assistance with  cooking/housework;Assist for transportation;Help with stairs or ramp for entrance    Equipment Recommendations Rolling walker (2 wheels)  Recommendations for Other Services       Functional Status Assessment Patient has had a recent decline in their functional status and demonstrates the ability to make significant improvements in function in a reasonable and predictable amount of time.     Precautions / Restrictions Precautions Precautions: Fall Restrictions Weight Bearing Restrictions: No Other Position/Activity Restrictions: wbat      Mobility  Bed Mobility Overal bed mobility: Needs Assistance Bed Mobility: Supine to Sit     Supine to sit: Min assist     General bed mobility comments: Min assist to bring LLE off bed    Transfers Overall transfer level: Needs assistance Equipment used: Rolling walker (2 wheels) Transfers: Sit to/from Stand Sit to Stand: Min guard, From elevated surface           General transfer comment: Min guard from stretcher, no physical assist required, VCs for sequencing and hand placement.    Ambulation/Gait Ambulation/Gait assistance: Supervision, Min guard Gait Distance (Feet): 100 Feet Assistive device: Rolling walker (2 wheels) Gait Pattern/deviations: Step-to pattern Gait velocity: decreased     General Gait Details: Pt ambulated with RW and min guard progressed to supervision, no physical assist required or overt LOB noted. Pt reporting no increase in pain or other symptoms, though does endorse fear of bearing weight on LLE despite encouragement and use of BUE on RW.  Stairs Stairs: Yes Stairs assistance: Min assist, +2 safety/equipment Stair Management: One rail Right, Step to pattern, Forwards, With cane Number of Stairs: 1 General stair comments: Pt attempted stair training with R railing and L SPC and pt unable to bring RLE up to 6" stair height likely secondary   to presence of spinal anesthesia as pt denied that this  limitation could be MS-related. Attempted x3, pt unable, deferred secondary to safety. Pt reported she has crutches at home and is able to safely utilize those and had been doing so prior to surgery. Handouts provided on stair mobility with single railing with crutches, single railing with cane, and sideways mobility with PT providing verbal education as well as physical demonstration. Pt and husband verbalized understanding. Pt verbalized that she will stay downstairs for the next few days and will be able to make it over the threshold using the RW with assist from husband.  Wheelchair Mobility    Modified Rankin (Stroke Patients Only)       Balance Overall balance assessment: Needs assistance Sitting-balance support: Feet supported, No upper extremity supported Sitting balance-Leahy Scale: Good     Standing balance support: Reliant on assistive device for balance, During functional activity, Bilateral upper extremity supported Standing balance-Leahy Scale: Poor                               Pertinent Vitals/Pain Pain Assessment Pain Assessment: 0-10 Pain Score: 4  Pain Location: left knee Pain Descriptors / Indicators: Operative site guarding, Discomfort Pain Intervention(s): Limited activity within patient's tolerance, Monitored during session, Repositioned, Ice applied    Home Living Family/patient expects to be discharged to:: Private residence Living Arrangements: Spouse/significant other Available Help at Discharge: Family;Available 24 hours/day Type of Home: House Home Access: Stairs to enter Entrance Stairs-Rails: None Entrance Stairs-Number of Steps: 1 Alternate Level Stairs-Number of Steps: 13 Home Layout: Two level;Bed/bath upstairs;Other (Comment) (Pt is able to sleep in recliner for the first few nights until she is able to manage stairs) Home Equipment: Crutches      Prior Function Prior Level of Function : Independent/Modified Independent              Mobility Comments: IND ADLs Comments: IND     Hand Dominance        Extremity/Trunk Assessment   Upper Extremity Assessment Upper Extremity Assessment: Overall WFL for tasks assessed    Lower Extremity Assessment Lower Extremity Assessment: RLE deficits/detail;LLE deficits/detail RLE Deficits / Details: MMT ank DF/PF 5/5, able to perform SLR RLE Sensation: WNL LLE Deficits / Details: MMT ank DF/PF 3+/5, pt unable to perform SLR LLE Sensation: WNL    Cervical / Trunk Assessment Cervical / Trunk Assessment: Normal  Communication   Communication: No difficulties  Cognition Arousal/Alertness: Awake/alert Behavior During Therapy: WFL for tasks assessed/performed Overall Cognitive Status: Within Functional Limits for tasks assessed                                          General Comments General comments (skin integrity, edema, etc.): Husband earl present for session    Exercises Total Joint Exercises Ankle Circles/Pumps: AROM, Both, 10 reps Quad Sets: AROM, Left, Other reps (comment) (2) Short Arc Quad: PROM, Left, Other reps (comment) (2) Heel Slides: AAROM, Left, Other reps (comment) (2) Hip ABduction/ADduction: AAROM, Left, Other reps (comment) (2) Straight Leg Raises: AAROM, Left, Other reps (comment) (2)   Assessment/Plan    PT Assessment Patient needs continued PT services  PT Problem List Decreased strength;Decreased range of motion;Decreased activity tolerance;Decreased balance;Decreased mobility;Decreased coordination;Pain       PT Treatment Interventions DME instruction;Gait training;Stair training;Functional mobility training;Therapeutic   activities;Therapeutic exercise;Balance training;Neuromuscular re-education;Patient/family education    PT Goals (Current goals can be found in the Care Plan section)  Acute Rehab PT Goals Patient Stated Goal: Be able to exercise PT Goal Formulation: With patient Time For Goal Achievement:  07/24/22 Potential to Achieve Goals: Good    Frequency 7X/week     Co-evaluation               AM-PAC PT "6 Clicks" Mobility  Outcome Measure Help needed turning from your back to your side while in a flat bed without using bedrails?: None Help needed moving from lying on your back to sitting on the side of a flat bed without using bedrails?: A Little Help needed moving to and from a bed to a chair (including a wheelchair)?: A Little Help needed standing up from a chair using your arms (e.g., wheelchair or bedside chair)?: A Little Help needed to walk in hospital room?: A Little Help needed climbing 3-5 steps with a railing? : A Lot 6 Click Score: 18    End of Session Equipment Utilized During Treatment: Gait belt Activity Tolerance: Patient tolerated treatment well;No increased pain Patient left: in chair;with call bell/phone within reach (on stretcher in PACU) Nurse Communication: Mobility status PT Visit Diagnosis: Pain;Difficulty in walking, not elsewhere classified (R26.2) Pain - Right/Left: Left Pain - part of body: Knee    Time: 6962-9528 PT Time Calculation (min) (ACUTE ONLY): 51 min   Charges:   PT Evaluation $PT Eval Low Complexity: 1 Low PT Treatments $Gait Training: 8-22 mins $Therapeutic Exercise: 8-22 mins        Coolidge Breeze, PT, DPT [facility] Department Office: [phone] Weekend pager: [phone]  Coolidge Breeze 07/17/2022, 7:43 PM

## 2022-07-17 NOTE — Anesthesia Procedure Notes (Signed)
Anesthesia Regional Block: Adductor canal block   Pre-Anesthetic Checklist: , timeout performed,  Correct Patient, Correct Site, Correct Laterality,  Correct Procedure, Correct Position, site marked,  Risks and benefits discussed,  Pre-op evaluation,  At surgeon's request and post-op pain management  Laterality: Left  Prep: Maximum Sterile Barrier Precautions used, chloraprep       Needles:  Injection technique: Single-shot  Needle Type: Echogenic Stimulator Needle     Needle Length: 9cm  Needle Gauge: 22     Additional Needles:   Procedures:,,,, ultrasound used (permanent image in chart),,    Narrative:  Start time: 07/17/2022 9:45 AM End time: 07/17/2022 9:48 AM Injection made incrementally with aspirations every 5 mL.  Performed by: Personally  Anesthesiologist: Brennan Bailey, MD  Additional Notes: Risks, benefits, and alternative discussed. Patient gave consent for procedure. Patient prepped and draped in sterile fashion. Sedation administered, patient remains easily responsive to voice. Relevant anatomy identified with ultrasound guidance. Local anesthetic given in 5cc increments with no signs or symptoms of intravascular injection. No pain or paraesthesias with injection. Patient monitored throughout procedure with signs of LAST or immediate complications. Tolerated well. Ultrasound image placed in chart.  Tawny Asal, MD

## 2022-07-17 NOTE — Transfer of Care (Signed)
Immediate Anesthesia Transfer of Care Note  Patient: Pamela Potter  Procedure(s) Performed: LEFT TOTAL KNEE ARTHROPLASTY (Left: Knee)  Patient Location: PACU  Anesthesia Type:Spinal  Level of Consciousness: sedated  Airway & Oxygen Therapy: Patient Spontanous Breathing and Patient connected to face mask oxygen  Post-op Assessment: Report given to RN and Post -op Vital signs reviewed and stable  Post vital signs: Reviewed and stable  Last Vitals:  Vitals Value Taken Time  BP    Temp    Pulse    Resp    SpO2      Last Pain:  Vitals:   07/17/22 0954  TempSrc:   PainSc: 0-No pain         Complications: No notable events documented.

## 2022-07-17 NOTE — Anesthesia Postprocedure Evaluation (Signed)
Anesthesia Post Note  Patient: Pamela Potter  Procedure(s) Performed: LEFT TOTAL KNEE ARTHROPLASTY (Left: Knee)     Patient location during evaluation: PACU Anesthesia Type: Spinal Level of consciousness: awake and alert Pain management: pain level controlled Vital Signs Assessment: post-procedure vital signs reviewed and stable Respiratory status: spontaneous breathing, nonlabored ventilation and respiratory function stable Cardiovascular status: blood pressure returned to baseline Postop Assessment: no apparent nausea or vomiting, spinal receding, no headache and no backache Anesthetic complications: no   No notable events documented.  Last Vitals:  Vitals:   07/17/22 1430 07/17/22 1445  BP: 127/70 103/68  Pulse: 62 66  Resp: 18 17  Temp:    SpO2: 97% 99%    Last Pain:  Vitals:   07/17/22 1445  TempSrc:   PainSc: [place]

## 2022-07-18 ENCOUNTER — Encounter (HOSPITAL_COMMUNITY): Payer: Self-pay | Admitting: Orthopaedic Surgery

## 2022-08-28 DIAGNOSIS — Z8669 Personal history of other diseases of the nervous system and sense organs: Secondary | ICD-10-CM | POA: Insufficient documentation

## 2022-08-28 DIAGNOSIS — Z947 Corneal transplant status: Secondary | ICD-10-CM | POA: Insufficient documentation

## 2022-08-28 DIAGNOSIS — H2513 Age-related nuclear cataract, bilateral: Secondary | ICD-10-CM | POA: Insufficient documentation

## 2022-08-28 DIAGNOSIS — H40051 Ocular hypertension, right eye: Secondary | ICD-10-CM | POA: Insufficient documentation

## 2022-09-06 ENCOUNTER — Ambulatory Visit
Admission: RE | Admit: 2022-09-06 | Discharge: 2022-09-06 | Disposition: A | Discharge status: Home / Self Care | Payer: Medicare Other | Source: Ambulatory Visit | Attending: Physician Assistant | Admitting: Physician Assistant

## 2022-09-06 ENCOUNTER — Ambulatory Visit (INDEPENDENT_AMBULATORY_CARE_PROVIDER_SITE_OTHER): Payer: Medicare Other

## 2022-09-06 VITALS — BP 166/99 | HR 90 | Temp 98.3°F | Resp 16

## 2022-09-06 DIAGNOSIS — J209 Acute bronchitis, unspecified: Secondary | ICD-10-CM

## 2022-09-06 DIAGNOSIS — R059 Cough, unspecified: Secondary | ICD-10-CM | POA: Diagnosis not present

## 2022-09-06 MED ORDER — PREDNISONE 20 MG PO TABS: 40.0000 mg | ORAL_TABLET | Freq: Every day | ORAL | 0 refills | Status: DC

## 2022-09-06 MED ORDER — AZITHROMYCIN 250 MG PO TABS: 250.0000 mg | ORAL_TABLET | Freq: Every day | ORAL | 0 refills | Status: DC

## 2022-09-06 NOTE — ED Provider Notes (Signed)
[name]    CSN: [id number] Arrival date & time: 09/06/22  1101      History   Chief Complaint Chief Complaint  Patient presents with   Cough    HPI Pamela Potter is a 56 y.o. female.   Patient here today for evaluation of cough that is been ongoing for the last several weeks.  She states she is concerned because she had COVID 2 months ago and has developed a cough with a rattling in her chest.  She has not had any fever.  She has tried using her inhaler without significant improvement.  She is most concerned for walking pneumonia.  The history is provided by the patient.  Cough Associated symptoms: no chills, no ear pain, no eye discharge, no fever, no shortness of breath, no sore throat and no wheezing     Past Medical History:  Diagnosis Date   Anemia    Asthma    Hypertension    Hypothyroidism    Multiple sclerosis ([place])    Neuromuscular disorder ([place])    Pre-diabetes    Vertigo    Vision abnormalities     Patient Active Problem List   Diagnosis Date Noted   Primary osteoarthritis of left knee 07/17/2022   High risk medication use 01/05/2020   Left knee pain 01/05/2020   Atypical facial pain 07/08/2019   Vitamin D deficiency 07/07/2018   Multiple sclerosis ([place]) 10/11/2016   Gait disturbance 10/11/2016   Urge incontinence of urine 10/11/2016   Numbness 10/11/2016   Corneal graft malfunction 11/21/2011   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      Home Medications    Prior to Admission medications   Medication Sig Start Date End Date Taking? Authorizing Provider  azithromycin (ZITHROMAX) 250 MG tablet Take 1 tablet (250 mg  total) by mouth daily. Take first 2 tablets together, then 1 every day until finished. 09/06/22  Yes Francene Finders, PA-C  predniSONE (DELTASONE) 20 MG tablet Take 2 tablets (40 mg total) by mouth daily with breakfast for 5 days. 09/06/22 09/11/22 Yes Francene Finders, PA-C  albuterol (VENTOLIN HFA) 108 (90 Base) MCG/ACT inhaler Inhale 2 puffs into the lungs every 4 (four) hours as needed for wheezing or shortness of breath. 06/15/22   Hunsucker, Bonna Gains, MD  carvedilol (COREG) 6.25 MG tablet Take 6.25 mg by mouth 2 (two) times daily with a meal.    [provider]  cetirizine (ZYRTEC ALLERGY) 10 MG tablet Take 1 tablet (10 mg total) by mouth daily. Patient taking differently: Take 10 mg by mouth daily as needed for allergies. 05/02/21   Hunsucker, Bonna Gains, MD  Fluticasone-Umeclidin-Vilant (TRELEGY ELLIPTA) 200-62.5-25 MCG/INH AEPB Inhale 1 puff into the lungs daily. 03/29/21   Hunsucker, Bonna Gains, MD  furosemide (LASIX) 20 MG tablet Take 20 mg by mouth 2 (two) times daily. 06/21/22   [provider]  guaiFENesin 200 MG tablet Take 1 tablet (200 mg total) by mouth every 4 (four) hours as needed for cough or to loosen phlegm. Patient not taking: Reported on 07/06/2022 04/06/22   Teodora Medici, FNP  HYDROcodone-acetaminophen (NORCO/VICODIN) [phone]  MG tablet Take 1-2 tablets by mouth every 6 (six) hours as needed for moderate pain or severe pain (post op pain). 07/17/22 07/17/23  Loni Dolly, PA-C  ipratropium (ATROVENT) 0.06 % nasal spray Place 2 sprays into both nostrils 2 (two) times daily. Patient taking differently: Place 2 sprays into both nostrils 2 (two) times daily as needed for rhinitis. 09/12/21   Hunsucker, Bonna Gains, MD  levothyroxine (SYNTHROID, LEVOTHROID) 50 MCG tablet Take 50 mcg by mouth daily before breakfast.    [provider]  ocrelizumab 600 mg in sodium chloride 0.9 % 500 mL Inject 600 mg into the vein every 6 (six) months.    [provider]   prednisoLONE acetate (PRED FORTE) 1 % ophthalmic suspension Place 1 drop into the right eye 4 (four) times a week.    [provider]  Saline (SIMPLY SALINE) 0.9 % AERS Place 1 spray into the nose as needed (congestion).    [provider]  timolol (BETIMOL) 0.5 % ophthalmic solution Place 1 drop into the right eye daily.    [provider]  tiZANidine (ZANAFLEX) 4 MG tablet Take 1 tablet (4 mg total) by mouth every 6 (six) hours as needed for muscle spasms. 07/17/22 07/17/23  Loni Dolly, PA-C    Family History Family History  Problem Relation Age of Onset   Hyperlipidemia Mother    Hypertension Mother    Stroke Father    Diabetes Father     Social History Social History   Tobacco Use   Smoking status: Never   Smokeless tobacco: Never  Vaping Use   Vaping Use: Never used  Substance Use Topics   Alcohol use: Never    Alcohol/week: 0.0 standard drinks of alcohol   Drug use: Never     Allergies   Ace inhibitors, Sulfa antibiotics, Triamterene, Amoxicillin, Benadryl [diphenhydramine], Dyazide [hydrochlorothiazide w-triamterene], Lisinopril, Norvasc [amlodipine besylate], and Dilantin [phenytoin sodium extended]   Review of Systems Review of Systems  Constitutional:  Negative for chills and fever.  HENT:  Negative for congestion, ear pain and sore throat.   Eyes:  Negative for discharge and redness.  Respiratory:  Positive for cough. Negative for shortness of breath and wheezing.   Gastrointestinal:  Negative for abdominal pain, diarrhea, nausea and vomiting.     Physical Exam Triage Vital Signs ED Triage Vitals [09/06/22 1129]  Enc Vitals Group     BP (!) 166/99     Pulse Rate 90     Resp 16     Temp 98.3 F (36.8 C)     Temp Source Oral     SpO2 97 %     Weight      Height      Head Circumference      Peak Flow      Pain Score 0     Pain Loc      Pain Edu?      Excl. in [place]?    No data found.  Updated Vital Signs BP (!)  166/99 (BP Location: Left Arm)   Pulse 90   Temp 98.3 F (36.8 C) (Oral)   Resp 16   LMP 01/01/2017 (Exact Date)   SpO2 97%   Physical Exam Vitals and nursing note reviewed.  Constitutional:      General: She is not in acute distress.    Appearance: Normal appearance. She is not ill-appearing.  HENT:     Head: Normocephalic and atraumatic.     Nose: Congestion present.  Mouth/Throat:     Mouth: Mucous membranes are moist.     Pharynx: No oropharyngeal exudate or posterior oropharyngeal erythema.  Eyes:     Conjunctiva/sclera: Conjunctivae normal.  Cardiovascular:     Rate and Rhythm: Normal rate and regular rhythm.     Heart sounds: Normal heart sounds. No murmur heard. Pulmonary:     Effort: Pulmonary effort is normal. No respiratory distress.     Breath sounds: Normal breath sounds. No wheezing, rhonchi or rales.  Skin:    General: Skin is warm and dry.  Neurological:     Mental Status: She is alert.  Psychiatric:        Mood and Affect: Mood normal.        Thought Content: Thought content normal.      UC Treatments / Results  Labs (all labs ordered are listed, but only abnormal results are displayed) Labs Reviewed - No data to display  EKG   Radiology DG Chest 2 View  Result Date: 09/06/2022 CLINICAL DATA:  Cough. EXAM: CHEST - 2 VIEW COMPARISON:  09/12/2021 FINDINGS: Mild chronic elevation of the right hemidiaphragm. Chronic densities along the anterior lower chest on the lateral view. Otherwise, the lungs are clear. Negative for a pneumothorax. Heart and mediastinum are within normal limits. Trachea is deviated towards the left in the lower neck and this appears to be new. No large pleural effusions. IMPRESSION: 1. No acute cardiopulmonary disease. 2. Chronic elevation of the right hemidiaphragm. 3. Trachea is deviated towards the left in the lower neck. This finding is nonspecific and consider clinical correlation in this area. This area could be further  evaluated with additional radiography or ultrasound. Electronically Signed   By: Markus Daft M.D.   On: 09/06/2022 12:15    Procedures Procedures (including critical care time)  Medications Ordered in UC Medications - No data to display  Initial Impression / Assessment and Plan / UC Course  I have reviewed the triage vital signs and the nursing notes.  Pertinent labs & imaging results that were available during my care of the patient were reviewed by me and considered in my medical decision making (see chart for details).    CXR clear. Will treat with zpak and prednisone to cover possible bronchitis as well as atypical pneumonia. Encouraged follow up with any further concerns or worsening symptoms.   Final Clinical Impressions(s) / UC Diagnoses   Final diagnoses:  Acute bronchitis, unspecified organism   Discharge Instructions   None    ED Prescriptions     Medication Sig Dispense Auth. Provider   azithromycin (ZITHROMAX) 250 MG tablet Take 1 tablet (250 mg total) by mouth daily. Take first 2 tablets together, then 1 every day until finished. 6 tablet Ewell Poe F, PA-C   predniSONE (DELTASONE) 20 MG tablet Take 2 tablets (40 mg total) by mouth daily with breakfast for 5 days. 10 tablet Francene Finders, PA-C      PDMP not reviewed this encounter.   Francene Finders, PA-C 09/06/22 1427

## 2022-09-06 NOTE — ED Triage Notes (Signed)
Pt c/o back pain and mucous in throat that she can hear rattling and is concerned for walking PNA. States that she has had COVID within the last 2 months, another COVID infection from a prior year that put scarring on her lungs and she uses an inhaler b/c she has not fully recovered.

## 2022-09-10 ENCOUNTER — Ambulatory Visit (INDEPENDENT_AMBULATORY_CARE_PROVIDER_SITE_OTHER): Payer: Medicare Other

## 2022-09-10 ENCOUNTER — Encounter: Payer: Self-pay | Admitting: Pulmonary Disease

## 2022-09-10 ENCOUNTER — Ambulatory Visit: Payer: Medicare Other | Admitting: Pulmonary Disease

## 2022-09-10 VITALS — BP 140/80 | HR 93 | Temp 99.7°F | Wt 163.4 lb

## 2022-09-10 DIAGNOSIS — R051 Acute cough: Secondary | ICD-10-CM

## 2022-09-10 DIAGNOSIS — J398 Other specified diseases of upper respiratory tract: Secondary | ICD-10-CM

## 2022-09-10 LAB — POC COVID19 BINAXNOW: SARS Coronavirus 2 Ag: NEGATIVE

## 2022-09-10 LAB — POCT INFLUENZA A/B
Influenza A, POC: NEGATIVE
Influenza B, POC: NEGATIVE

## 2022-09-10 MED ORDER — LEVOFLOXACIN 750 MG PO TABS: 750.0000 mg | ORAL_TABLET | Freq: Every day | ORAL | 0 refills | Status: DC

## 2022-09-10 MED ORDER — PREDNISONE 20 MG PO TABS: 20.0000 mg | ORAL_TABLET | Freq: Every day | ORAL | 0 refills | Status: AC

## 2022-09-10 NOTE — Patient Instructions (Addendum)
I am sorry you are not feeling well  Your flu and COVID tests are negative  We will get a chest x-ray today.  I will call in some more antibiotics and steroids.  Take levofloxacin 1 tablet daily for 5 days then stop.  Take prednisone that you were prescribed, it should be running out today or tomorrow.  Once you finish this, take prednisone 20 mg once daily for 5 additional days.  This dose is a little bit lower than the 40 mg you were initially prescribed.  Return to clinic in 4 weeks or sooner if needed with Dr. Silas Flood

## 2022-09-10 NOTE — Progress Notes (Signed)
$'@Patient'z$  ID: Pamela Potter, female    DOB: 01-21-1966, 56 y.o.   MRN: [id number]  Chief Complaint  Patient presents with   Follow-up    Pt is here for follow up for her cough. Pt states she was just dx with severe bronchitis. Pt was given ABT and Prednisone. She states she feels like the new medication is not helping. She is still coughing I=up green nasty looking mucus. Pt states chills and low grade fever noted this morning. Chest xray was noted last week and Dr wanted you to take a look given her esophagus is leaning to one side.     Referring provider: Shirline Frees, MD  HPI:   56 y.o. whom we are seeing in follow-up for evaluation of dyspnea on exertion and cough since COVID-19 infection 09/2020.  Recent ED note 09/06/2022 reviewed.  She returns today.  Routine follow-up.  At last visit she did stop Trelegy and cough is worse.  Stop it due to perceived side effects to stop Trelegy.  The side effects persisted.  She since resumed the Trelegy.  She contracted COVID October 2023.  Prolonged bronchitis-like symptoms.  Did receive antiviral drugs.  Did not think it helped very much.  Has had prolonged basically bronchitis symptoms for the last several weeks.  Recently seen in urgent care 09/06/2022.  Diagnosed with bronchitis.  Prescribed azithromycin as well as prednisone.  Chest x-ray obtained on my review interpretation shows chronic patchy infiltrates likely sequela of prior COVID infection as demonstrated on prior CT high-resolution 03/2021.  Subtle sign of volume loss with tracheal deviation to the left.  She has had no real improvement in symptoms.  In fact now has mild elevation in temperature although no true fever.  HPI at initial visit Patient contracted COVID 09/2020.  She received antibody infusion.  She had COVID-vaccine x3 prior to infection.  Symptoms were nasal congestion, sore throat.  Mild dyspnea.  Though symptoms gradually improved except congestion in the nasal passages  has persisted.  Dyspnea exertion has worsened.  Dyspnea worse up inclines or stairs.  Can be present on flat surfaces as well.  No timing during day when things are better or worse.  No seasonal or environmental changes make things better or worse.  She did receive prednisone a few days ago and this seemed to help somewhat.  Similarly, no rhyme or reason to her cough.  No relieving or exacerbating factors that she can readily identify.  No timing during day were things are better or worse.  No seasonal or environmental changes.  With the symptoms she went to the ED 11/2020 and had a CTA chest PE protocol performed which on my interpretation reveals no evidence of PE, scattered faint groundglass opacities bilaterally throughout all lobes consistent with atypical pneumonia versus resolving COVID-19 pneumonia.  She was given doxycycline.  Per neurology notes 12/2020 symptoms improved quite a bit with this intervention.  However patient does not recall this and states nothing is really helped.  PMH: Hypertension, MS Surgical history: Reviewed reviewed with patient, she denies surgeries Family history: Reviewed with patient, she denies respiratory illnesses in first-degree relatives Social history: Never smoker, lives with husband, lives in [place] / Pulmonary Flowsheets:   ACT:  Asthma Control Test ACT Total Score  05/08/2022 11:00 AM 17   MMRC:     No data to display           [place]worth:      No data to display  Tests:   FENO:  No results found for: "NITRICOXIDE"  PFT:    Latest Ref Rng & Units 02/20/2021   11:47 AM  PFT Results  FVC-Pre L 2.35   FVC-Predicted Pre % 90   FVC-Post L 2.34   FVC-Predicted Post % 90   Pre FEV1/FVC % % 87   Post FEV1/FCV % % 88   FEV1-Pre L 2.03   FEV1-Predicted Pre % 99   FEV1-Post L 2.06   DLCO uncorrected ml/min/mmHg 9.65   DLCO UNC% % 49   DLCO corrected ml/min/mmHg 9.65   DLCO COR %Predicted % 49   DLVA  Predicted % 68   TLC L 3.12   TLC % Predicted % 64   RV % Predicted % 48   Personally reviewed and interpreted as normal spirometry with moderate restriction based on TLC with severely reduced DLCO.  WALK:      No data to display           Imaging: Personally reviewed and as per EMR discussion this note CT high-res 03/2021.  This demonstrated on my interpretation scattered groundglass opacities with mild bronchiectasis scattered throughout concerning for ongoing inflammation from COVID-19 with the presence of bronchiectasis developing fibrosis although there is no frank fibrosis demonstrated on the scan.  Lab Results: Personally reviewed, no significant elevation of eosinophils and no anemia CBC    Component Value Date/Time   WBC 4.0 07/11/2022 1114   RBC 4.65 07/11/2022 1114   HGB 12.7 07/11/2022 1114   HGB 11.9 06/21/2022 1116   HCT 39.9 07/11/2022 1114   HCT 37.2 06/21/2022 1116   PLT 296 07/11/2022 1114   PLT 296 06/21/2022 1116   MCV 85.8 07/11/2022 1114   MCV 84 06/21/2022 1116   MCH 27.3 07/11/2022 1114   MCHC 31.8 07/11/2022 1114   RDW 15.1 07/11/2022 1114   RDW 14.8 06/21/2022 1116   LYMPHSABS 1.1 06/21/2022 1116   EOSABS 0.2 06/21/2022 1116   BASOSABS 0.0 06/21/2022 1116    BMET    Component Value Date/Time   NA 138 07/11/2022 1114   NA 144 06/21/2022 1116   K 4.5 07/11/2022 1114   CL 104 07/11/2022 1114   CO2 28 07/11/2022 1114   GLUCOSE 99 07/11/2022 1114   BUN 17 07/11/2022 1114   BUN 17 06/21/2022 1116   CREATININE 0.96 07/11/2022 1114   CALCIUM 9.6 07/11/2022 1114   GFRNONAA >60 07/11/2022 1114   GFRAA 70 07/11/2020 1343    BNP No results found for: "BNP"  ProBNP No results found for: "PROBNP"  Specialty Problems   None  Allergies  Allergen Reactions   Ace Inhibitors Swelling    lips   Sulfa Antibiotics Hives and Itching   Triamterene Swelling   Amoxicillin Diarrhea   Benadryl [Diphenhydramine] Other (See Comments)     Difficulty swallowing   Dyazide [Hydrochlorothiazide W-Triamterene] Swelling   Lisinopril Swelling   Norvasc [Amlodipine Besylate] Swelling   Dilantin [Phenytoin Sodium Extended] Hives and Rash    Immunization History  Administered Date(s) Administered   Influenza,inj,Quad PF,6+ Mos 08/13/2018   Influenza-Unspecified 08/08/2014, 07/02/2019, 07/14/2021   Zoster, Live 09/07/2013    Past Medical History:  Diagnosis Date   Anemia    Asthma    Hypertension    Hypothyroidism    Multiple sclerosis ([place])    Neuromuscular disorder ([place])    Pre-diabetes    Vertigo    Vision abnormalities     Tobacco History: Social History   Tobacco Use  Smoking Status Never  Smokeless Tobacco Never   Counseling given: Not Answered   Continue to not smoke  Outpatient Encounter Medications as of 09/10/2022  Medication Sig   albuterol (VENTOLIN HFA) 108 (90 Base) MCG/ACT inhaler Inhale 2 puffs into the lungs every 4 (four) hours as needed for wheezing or shortness of breath.   carvedilol (COREG) 6.25 MG tablet Take 6.25 mg by mouth 2 (two) times daily with a meal.   cetirizine (ZYRTEC ALLERGY) 10 MG tablet Take 1 tablet (10 mg total) by mouth daily. (Patient taking differently: Take 10 mg by mouth daily as needed for allergies.)   Fluticasone-Umeclidin-Vilant (TRELEGY ELLIPTA) 200-62.5-25 MCG/INH AEPB Inhale 1 puff into the lungs daily.   furosemide (LASIX) 20 MG tablet Take 20 mg by mouth 2 (two) times daily.   guaiFENesin 200 MG tablet Take 1 tablet (200 mg total) by mouth every 4 (four) hours as needed for cough or to loosen phlegm.   HYDROcodone-acetaminophen (NORCO/VICODIN) 5-325 MG tablet Take 1-2 tablets by mouth every 6 (six) hours as needed for moderate pain or severe pain (post op pain).   ipratropium (ATROVENT) 0.06 % nasal spray Place 2 sprays into both nostrils 2 (two) times daily. (Patient taking differently: Place 2 sprays into both nostrils 2 (two) times daily as needed for  rhinitis.)   levofloxacin (LEVAQUIN) 750 MG tablet Take 1 tablet (750 mg total) by mouth daily.   levothyroxine (SYNTHROID, LEVOTHROID) 50 MCG tablet Take 50 mcg by mouth daily before breakfast.   ocrelizumab 600 mg in sodium chloride 0.9 % 500 mL Inject 600 mg into the vein every 6 (six) months.   prednisoLONE acetate (PRED FORTE) 1 % ophthalmic suspension Place 1 drop into the right eye 4 (four) times a week.   predniSONE (DELTASONE) 20 MG tablet Take 1 tablet (20 mg total) by mouth daily with breakfast for 5 days.   Saline (SIMPLY SALINE) 0.9 % AERS Place 1 spray into the nose as needed (congestion).   timolol (BETIMOL) 0.5 % ophthalmic solution Place 1 drop into the right eye daily.   tiZANidine (ZANAFLEX) 4 MG tablet Take 1 tablet (4 mg total) by mouth every 6 (six) hours as needed for muscle spasms.   [DISCONTINUED] azithromycin (ZITHROMAX) 250 MG tablet Take 1 tablet (250 mg total) by mouth daily. Take first 2 tablets together, then 1 every day until finished.   [DISCONTINUED] predniSONE (DELTASONE) 20 MG tablet Take 2 tablets (40 mg total) by mouth daily with breakfast for 5 days.   No facility-administered encounter medications on file as of 09/10/2022.     Review of Systems  Review of Systems  N/a Physical Exam  BP (!) 140/80 (BP Location: Left Arm, Patient Position: Sitting, Cuff Size: Normal)   Pulse 93   Temp 99.7 F (37.6 C) (Oral)   Wt 163 lb 6.4 oz (74.1 kg)   LMP 01/01/2017 (Exact Date)   SpO2 98%   BMI 28.95 kg/m   Wt Readings from Last 5 Encounters:  09/10/22 163 lb 6.4 oz (74.1 kg)  07/17/22 163 lb (73.9 kg)  07/11/22 163 lb (73.9 kg)  06/21/22 166 lb 11.2 oz (75.6 kg)  06/15/22 159 lb 6.4 oz (72.3 kg)    BMI Readings from Last 5 Encounters:  09/10/22 28.95 kg/m  07/17/22 28.87 kg/m  07/11/22 28.87 kg/m  06/21/22 28.61 kg/m  06/15/22 28.24 kg/m     Physical Exam General: Well-appearing, no acute distress Eyes: EOMI, no icterus Neck: Supple  no JVP Cardiovascular:  Regular rate and rhythm, no murmur Pulmonary: Inspiratory crackles left lower lung field, normal work of breathing Abdomen: Nondistended, bowel sounds present MSK: No synovitis, no joint effusion Neuro: Normal gait, no weakness Psych: Normal mood, full affect   Assessment & Plan:   Presumed asthma: Triggered by COVID-19 infection.  Breo without much improvement.  Trelegy did yield improvement in breathing and dyspnea on exertion as well as cough.  Have encouraged her to continue this as maintenance therapy.  Rescue inhaler, albuterol, to continue.  Prolonged cough/bronchitis: No real improvement with recent prednisone and azithromycin.  Crackles left lung base.  Chest x-ray today.  5 days levofloxacin prescribed.  Completing prednisone 40 mg daily.  Extend course by 5 days additional 20 mg daily for 5 days.  COVID and flu test obtained today both negative.   Return in about 4 weeks (around 10/08/2022).   Lanier Clam, MD 09/10/2022   I spent 43 minutes in the care of the patient including face-to-face visit, coordination of care, review of records.

## 2022-09-11 NOTE — Progress Notes (Signed)
Chest xray looks clear. The trachea or wind pipe remains pushed to the left. For further evaluation I recommend a CT neck and chest with IV contrast if patient is amenable to additional work up for this.

## 2022-09-18 ENCOUNTER — Ambulatory Visit
Admission: RE | Admit: 2022-09-18 | Discharge: 2022-09-18 | Disposition: A | Discharge status: Home / Self Care | Payer: Medicare Other | Source: Ambulatory Visit | Attending: Physician Assistant | Admitting: Physician Assistant

## 2022-09-18 VITALS — BP 146/86 | HR 96 | Temp 99.7°F | Resp 17

## 2022-09-18 DIAGNOSIS — M546 Pain in thoracic spine: Secondary | ICD-10-CM | POA: Diagnosis not present

## 2022-09-18 DIAGNOSIS — J209 Acute bronchitis, unspecified: Secondary | ICD-10-CM | POA: Diagnosis not present

## 2022-09-18 DIAGNOSIS — Z1152 Encounter for screening for COVID-19: Secondary | ICD-10-CM | POA: Diagnosis not present

## 2022-09-18 DIAGNOSIS — R509 Fever, unspecified: Secondary | ICD-10-CM | POA: Diagnosis present

## 2022-09-18 DIAGNOSIS — B349 Viral infection, unspecified: Secondary | ICD-10-CM | POA: Insufficient documentation

## 2022-09-18 LAB — POCT URINALYSIS DIP (MANUAL ENTRY)
Bilirubin, UA: NEGATIVE
Blood, UA: NEGATIVE
Glucose, UA: NEGATIVE mg/dL
Ketones, POC UA: NEGATIVE mg/dL
Leukocytes, UA: NEGATIVE
Nitrite, UA: NEGATIVE
Protein Ur, POC: 30 mg/dL — AB
Spec Grav, UA: >=1.03 — AB (ref 1.010–1.025)
Urobilinogen, UA: 0.2 E.U./dL
pH, UA: 6 (ref 5.0–8.0)

## 2022-09-18 LAB — RESP PANEL BY RT-PCR (RSV, FLU A&B, COVID)  RVPGX2
Influenza A by PCR: NEGATIVE
Influenza B by PCR: NEGATIVE
Resp Syncytial Virus by PCR: NEGATIVE
SARS Coronavirus 2 by RT PCR: NEGATIVE

## 2022-09-18 LAB — POCT INFLUENZA A/B
Influenza A, POC: NEGATIVE
Influenza B, POC: NEGATIVE

## 2022-09-18 MED ORDER — ACETAMINOPHEN 325 MG PO TABS
975.0000 mg | ORAL_TABLET | Freq: Once | ORAL | Status: AC
  Administered 2022-09-18: 975 mg via ORAL

## 2022-09-18 NOTE — ED Provider Notes (Signed)
[name]    CSN: [id number] Arrival date & time: 09/18/22  1218      History   Chief Complaint Chief Complaint  Patient presents with   Chills   Back Pain    HPI Pamela Potter is a 56 y.o. female.   56 year old female presents with fever, chills, body aches and pain.  Patient indicates for the past 2 weeks she has been having persistent cough, congestion, chest congestion with production being clear to yellow.  Intermittent shortness of breath associated with her coughing.  Patient indicates she has had fever for the past 2 weeks which has ranged from 99-101.  Patient indicates for the past couple days she has been having body aches, back pain, chills and sweats.  Patient was seen here at the [facility] on 09/06/2022 and had chest x-ray performed without evidence of pneumonia being present.  Patient also indicates that she saw her pulmonologist about a week ago and the pulmonologist put her on Levaquin for 7 days which she completed 3 days ago.  She indicates the pulmonologist also did a chest x-ray which did not indicate any pneumonia.  Patient indicates she continues to have daily fever, chills, body aches and pains.  She is concerned that she is not getting better.  Patient also indicates that both the pulmonologist and here at [facility] flu and COVID test were performed and they were negative.  She denies any nausea or vomiting.  Patient indicates she continues to have left lower back pain without any urinary symptoms of frequency or dysuria.   Back Pain Associated symptoms: fever     Past Medical History:  Diagnosis Date   Anemia    Asthma    Hypertension    Hypothyroidism    Multiple sclerosis (HCC)    Neuromuscular disorder ([place])    Pre-diabetes    Vertigo    Vision abnormalities     Patient Active Problem List   Diagnosis Date Noted   Primary osteoarthritis of left knee 07/17/2022   High risk medication use 01/05/2020   Left knee pain 01/05/2020   Atypical  facial pain 07/08/2019   Vitamin D deficiency 07/07/2018   Multiple sclerosis ([place]) 10/11/2016   Gait disturbance 10/11/2016   Urge incontinence of urine 10/11/2016   Numbness 10/11/2016   Corneal graft malfunction 11/21/2011   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      Home Medications    Prior to Admission medications   Medication Sig Start Date End Date Taking? Authorizing Provider  albuterol (VENTOLIN HFA) 108 (90 Base) MCG/ACT inhaler Inhale 2 puffs into the lungs every 4 (four) hours as needed for wheezing or shortness of breath. 06/15/22   Hunsucker, Bonna Gains, MD  carvedilol (COREG) 6.25 MG tablet Take 6.25 mg by mouth 2 (two) times daily with a meal.    [provider]  cetirizine (ZYRTEC ALLERGY) 10 MG tablet Take 1 tablet (10 mg total) by mouth daily. Patient taking differently: Take 10 mg by mouth daily as needed for allergies. 05/02/21   Hunsucker, Bonna Gains, MD  Fluticasone-Umeclidin-Vilant (TRELEGY ELLIPTA) 200-62.5-25 MCG/INH AEPB Inhale 1 puff into the lungs daily.  03/29/21   Hunsucker, Bonna Gains, MD  furosemide (LASIX) 20 MG tablet Take 20 mg by mouth 2 (two) times daily. 06/21/22   [provider]  guaiFENesin 200 MG tablet Take 1 tablet (200 mg total) by mouth every 4 (four) hours as needed for cough or to loosen phlegm. 04/06/22   Teodora Medici, FNP  HYDROcodone-acetaminophen (NORCO/VICODIN) 5-325 MG tablet Take 1-2 tablets by mouth every 6 (six) hours as needed for moderate pain or severe pain (post op pain). 07/17/22 07/17/23  Loni Dolly, PA-C  ipratropium (ATROVENT) 0.06 % nasal spray Place 2 sprays into both nostrils 2 (two) times  daily. Patient taking differently: Place 2 sprays into both nostrils 2 (two) times daily as needed for rhinitis. 09/12/21   Hunsucker, Bonna Gains, MD  levofloxacin (LEVAQUIN) 750 MG tablet Take 1 tablet (750 mg total) by mouth daily. 09/10/22   Hunsucker, Bonna Gains, MD  levothyroxine (SYNTHROID, LEVOTHROID) 50 MCG tablet Take 50 mcg by mouth daily before breakfast.    [provider]  ocrelizumab 600 mg in sodium chloride 0.9 % 500 mL Inject 600 mg into the vein every 6 (six) months.    [provider]  prednisoLONE acetate (PRED FORTE) 1 % ophthalmic suspension Place 1 drop into the right eye 4 (four) times a week.    [provider]  Saline (SIMPLY SALINE) 0.9 % AERS Place 1 spray into the nose as needed (congestion).    [provider]  timolol (BETIMOL) 0.5 % ophthalmic solution Place 1 drop into the right eye daily.    [provider]  tiZANidine (ZANAFLEX) 4 MG tablet Take 1 tablet (4 mg total) by mouth every 6 (six) hours as needed for muscle spasms. 07/17/22 07/17/23  Loni Dolly, PA-C    Family History Family History  Problem Relation Age of Onset   Hyperlipidemia Mother    Hypertension Mother    Stroke Father    Diabetes Father     Social History Social History   Tobacco Use   Smoking status: Never   Smokeless tobacco: Never  Vaping Use   Vaping Use: Never used  Substance Use Topics   Alcohol use: Never    Alcohol/week: 0.0 standard drinks of alcohol   Drug use: Never     Allergies   Ace inhibitors, Sulfa antibiotics, Triamterene, Amoxicillin, Benadryl [diphenhydramine], Dyazide [hydrochlorothiazide w-triamterene], Lisinopril, Norvasc [amlodipine besylate], and Dilantin [phenytoin sodium extended]   Review of Systems Review of Systems  Constitutional:  Positive for chills, fatigue and fever.  Respiratory:  Positive for cough.   Musculoskeletal:  Positive for back pain (left lower back).     Physical Exam Triage Vital  Signs ED Triage Vitals [09/18/22 1324]  Enc Vitals Group     BP (!) 146/86     Pulse Rate 96     Resp 17     Temp 99.7 F (37.6 C)     Temp src      SpO2 95 %     Weight      Height      Head Circumference      Peak Flow      Pain Score      Pain Loc      Pain Edu?      Excl. in [place]?    No data found.  Updated Vital Signs BP (!) 146/86   Pulse 96   Temp 99.7 F (37.6 C)   Resp 17   LMP 01/01/2017 (Exact Date)  SpO2 95%   Visual Acuity Right Eye Distance:   Left Eye Distance:   Bilateral Distance:    Right Eye Near:   Left Eye Near:    Bilateral Near:     Physical Exam Constitutional:      Appearance: Normal appearance.  HENT:     Right Ear: Tympanic membrane and ear canal normal.     Left Ear: Tympanic membrane and ear canal normal.     Mouth/Throat:     Mouth: Mucous membranes are moist.     Pharynx: Oropharynx is clear. No pharyngeal swelling or posterior oropharyngeal erythema.  Cardiovascular:     Rate and Rhythm: Normal rate and regular rhythm.     Heart sounds: Normal heart sounds.  Pulmonary:     Effort: Pulmonary effort is normal.     Breath sounds: Normal breath sounds and air entry. No wheezing, rhonchi or rales.  Lymphadenopathy:     Cervical: No cervical adenopathy.  Neurological:     Mental Status: She is alert.      UC Treatments / Results  Labs (all labs ordered are listed, but only abnormal results are displayed) Labs Reviewed  POCT URINALYSIS DIP (MANUAL ENTRY) - Abnormal; Notable for the following components:      Result Value   Spec Grav, UA >=1.030 (*)    Protein Ur, POC =30 (*)    All other components within normal limits  RESP PANEL BY RT-PCR (RSV, FLU A&B, COVID)  RVPGX2  CBC  POCT INFLUENZA A/B    EKG   Radiology No results found.  Procedures Procedures (including critical care time)  Medications Ordered in UC Medications  acetaminophen (TYLENOL) tablet 975 mg (975 mg Oral Given 09/18/22 1343)    Initial  Impression / Assessment and Plan / UC Course  I have reviewed the triage vital signs and the nursing notes.  Pertinent labs & imaging results that were available during my care of the patient were reviewed by me and considered in my medical decision making (see chart for details).    Plan: 1.  The fever will be treated with the following: A.  Advised patient take Tylenol as needed to help control the fever. 2.  The acute bronchitis will be treated with the following: A.  Advised patient to take Mucinex DM as needed for cough and congestion. 3.  Screening for COVID-19 will be treated with the following: A.  Treatment will be modified depending on the test results for flu/COVID/RSV. 4.  The acute left-sided lower back pain will be treated with the following: A.  Advised patient to take Tylenol as needed for pain and discomfort. 5.  Advised patient to follow-up with PCP or pulmonology for further treatment if symptoms fail to improve. Final Clinical Impressions(s) / UC Diagnoses   Final diagnoses:  Fever, unspecified  Acute bronchitis, unspecified organism  Encounter for screening for COVID-19  Acute left-sided thoracic back pain  Acute viral syndrome     Discharge Instructions      COVID/flu/RSV test will be completed in 48 hours.  If you do not get a call from this office that indicates the test is negative.  Log onto MyChart to view the test results when they post in 48 hours.  The complete blood count will be completed in 48 hours.  If you do not get a call from this office that indicates the test is negative.  Log onto MyChart to view the test results when it post in 48 hours.  Advised take Tylenol as needed for fever.  And use Mucinex DM for cough and congestion. Advised to follow-up with PCP or return to urgent care if symptoms fail to improve.    ED Prescriptions   None    PDMP not reviewed this encounter.   Nyoka Lint, PA-C 09/18/22 1408

## 2022-09-18 NOTE — Discharge Instructions (Addendum)
COVID/flu/RSV test will be completed in 48 hours.  If you do not get a call from this office that indicates the test is negative.  Log onto MyChart to view the test results when they post in 48 hours.  The complete blood count will be completed in 48 hours.  If you do not get a call from this office that indicates the test is negative.  Log onto MyChart to view the test results when it post in 48 hours.  Advised take Tylenol as needed for fever.  And use Mucinex DM for cough and congestion. Advised to follow-up with PCP or return to urgent care if symptoms fail to improve.

## 2022-09-18 NOTE — ED Triage Notes (Signed)
Pt is present today with c/o chills and dull back pain.   Onset~ 2 weeks

## 2022-09-19 LAB — CBC
Hematocrit: 36 % (ref 34.0–46.6)
Hemoglobin: 12 g/dL (ref 11.1–15.9)
MCH: 26.3 pg — ABNORMAL LOW (ref 26.6–33.0)
MCHC: 33.3 g/dL (ref 31.5–35.7)
MCV: 79 fL (ref 79–97)
Platelets: 327 10*3/uL (ref 150–450)
RBC: 4.57 x10E6/uL (ref 3.77–5.28)
RDW: 15.5 % — ABNORMAL HIGH (ref 11.7–15.4)
WBC: 8.9 10*3/uL (ref 3.4–10.8)

## 2022-09-24 DIAGNOSIS — Z0289 Encounter for other administrative examinations: Secondary | ICD-10-CM

## 2022-09-27 ENCOUNTER — Telehealth: Payer: Self-pay | Admitting: *Deleted

## 2022-09-27 NOTE — Telephone Encounter (Signed)
Gave completed/signed [employer] [facility]/attending physician statement report back to medical records to process for pt.

## 2022-10-17 ENCOUNTER — Ambulatory Visit: Payer: Medicare Other | Admitting: Pulmonary Disease

## 2022-10-17 ENCOUNTER — Telehealth: Payer: Self-pay | Admitting: Pulmonary Disease

## 2022-10-17 ENCOUNTER — Ambulatory Visit
Admission: RE | Admit: 2022-10-17 | Discharge: 2022-10-17 | Disposition: A | Discharge status: Home / Self Care | Payer: Medicare Other | Source: Ambulatory Visit | Attending: Pulmonary Disease | Admitting: Pulmonary Disease

## 2022-10-17 DIAGNOSIS — J398 Other specified diseases of upper respiratory tract: Secondary | ICD-10-CM

## 2022-10-17 MED ORDER — IOPAMIDOL (ISOVUE-300) INJECTION 61%
85.0000 mL | Freq: Once | INTRAVENOUS | Status: AC | PRN
  Administered 2022-10-17: 85 mL via INTRAVENOUS

## 2022-10-17 MED ORDER — APIXABAN (ELIQUIS) VTE STARTER PACK (10MG AND 5MG): ORAL_TABLET | ORAL | 0 refills | Status: DC

## 2022-10-17 NOTE — Telephone Encounter (Signed)
Incidental PE discussed with patient. No evidence of right heart strain. She is asymptomatic. Eliquis starter pack sent to pharmacy, she is aware and will pick up tonight.

## 2022-10-17 NOTE — Addendum Note (Signed)
Addended byLarey Days on: 10/17/2022 05:14 PM   Modules accepted: Orders

## 2022-10-17 NOTE — Telephone Encounter (Signed)
Radiology calling. Sent to Triage.

## 2022-10-17 NOTE — Telephone Encounter (Signed)
This was addressed by Dr Silas Flood himself. The doctor himself was on the line and wanted to speak with Dr Silas Flood. Nothing further needed

## 2022-10-21 ENCOUNTER — Encounter: Payer: Self-pay | Admitting: Pulmonary Disease

## 2022-10-22 NOTE — Telephone Encounter (Signed)
Mychart message sent by pt: [facility] (supporting Lanier Clam, MD)13 hours ago (8:02 PM)    Hello Dr. Silas Flood, I was able to pick up medication at a cost of $210 and have completed three days.  However, I am very worried about the results simply because of my family history.  I have had three family members with PE including 2 aunts and my mother with DVTs.  One aunt passed away at age 58 and the other aunt faced life-threaten symptoms.   I would like to consider other options for treatment and testing, if you deem necessary because of my family circumstances.  Thanks in [place] for your support.    Dr. Silas Flood, please advise.

## 2022-10-24 ENCOUNTER — Ambulatory Visit: Payer: Medicare Other | Admitting: Pulmonary Disease

## 2022-10-24 ENCOUNTER — Encounter: Payer: Self-pay | Admitting: Pulmonary Disease

## 2022-10-24 VITALS — BP 122/68 | HR 94 | Temp 97.9°F | Wt 156.0 lb

## 2022-10-24 DIAGNOSIS — I2694 Multiple subsegmental pulmonary emboli without acute cor pulmonale: Secondary | ICD-10-CM | POA: Diagnosis not present

## 2022-10-24 NOTE — Patient Instructions (Signed)
Nice to see you again  Continue Eliquis as prescribed  Will get blood work today to look into genetic reasons for blood clotting given your family history  I provided samples of Trelegy today  Lets fill out manufacturing assistance paperwork again, worst-case scenario they say no, but they may approve it  I will send a message to kidney doctors about the cyst on the kidney  I will message my pharmacy colleagues about helping with the cost of Eliquis  Return to clinic in 3 months or sooner as needed with Dr. Silas Flood

## 2022-10-25 ENCOUNTER — Other Ambulatory Visit: Payer: Self-pay

## 2022-10-25 MED ORDER — TRELEGY ELLIPTA 200-62.5-25 MCG/ACT IN AEPB: 1.0000 | INHALATION_SPRAY | Freq: Every day | RESPIRATORY_TRACT | 12 refills | Status: DC

## 2022-10-25 NOTE — Progress Notes (Signed)
$'@Patient'H$  ID: Pamela Potter, female    DOB: 11/21/1965, 57 y.o.   MRN: [id number]  Chief Complaint  Patient presents with   Follow-up    Pt is here for follow up for cough. Pt states she is still clearing her throat a lot. Mucus is clear. Blood clot is why she is here. CT scan was done 1/10. Started Eliquis on 10/19/22.     Referring provider: Shirline Frees, MD  HPI:   57 y.o. whom we are seeing in follow-up for evaluation of dyspnea on exertion and cough since COVID-19 infection 09/2020.    At last visit, we did additional imaging to follow-up on chest x-ray that showed tracheal deviation.  CT neck shows no mass or concerning finding just does demonstrate tracheal deviation without clear cause.  CT chest was ordered to evaluate ongoing symptoms.  This showed patchy groundglass opacities consistent with resolving inflammatory process, likely infection as well as incidental discovery of acute pulmonary embolus.  I contacted her via phone and prescribed Eliquis.  She is on day 6 of the starter pack.  She has lots of questions about blood clotting.  She has strong family history which we discussed.  Discussed blood work to look for genetic defects and clotting cascade.  Referral to hematology in the future.  She did have COVID October as well as a knee replacement shortly thereafter.  Suspect pulmonary embolus are provoked in the setting of these infiltrates.  HPI at initial visit Patient contracted COVID 09/2020.  She received antibody infusion.  She had COVID-vaccine x3 prior to infection.  Symptoms were nasal congestion, sore throat.  Mild dyspnea.  Though symptoms gradually improved except congestion in the nasal passages has persisted.  Dyspnea exertion has worsened.  Dyspnea worse up inclines or stairs.  Can be present on flat surfaces as well.  No timing during day when things are better or worse.  No seasonal or environmental changes make things better or worse.  She did receive prednisone  a few days ago and this seemed to help somewhat.  Similarly, no rhyme or reason to her cough.  No relieving or exacerbating factors that she can readily identify.  No timing during day were things are better or worse.  No seasonal or environmental changes.  With the symptoms she went to the ED 11/2020 and had a CTA chest PE protocol performed which on my interpretation reveals no evidence of PE, scattered faint groundglass opacities bilaterally throughout all lobes consistent with atypical pneumonia versus resolving COVID-19 pneumonia.  She was given doxycycline.  Per neurology notes 12/2020 symptoms improved quite a bit with this intervention.  However patient does not recall this and states nothing is really helped.  PMH: Hypertension, MS Surgical history: Reviewed reviewed with patient, she denies surgeries Family history: Reviewed with patient, she denies respiratory illnesses in first-degree relatives Social history: Never smoker, lives with husband, lives in [place] / Pulmonary Flowsheets:   ACT:  Asthma Control Test ACT Total Score  05/08/2022 11:00 AM 17   MMRC:     No data to display           [place]:      No data to display           Tests:   FENO:  No results found for: "NITRICOXIDE"  PFT:    Latest Ref Rng & Units 02/20/2021   11:47 AM  PFT Results  FVC-Pre L 2.35   FVC-Predicted Pre % 90  FVC-Post L 2.34   FVC-Predicted Post % 90   Pre FEV1/FVC % % 87   Post FEV1/FCV % % 88   FEV1-Pre L 2.03   FEV1-Predicted Pre % 99   FEV1-Post L 2.06   DLCO uncorrected ml/min/mmHg 9.65   DLCO UNC% % 49   DLCO corrected ml/min/mmHg 9.65   DLCO COR %Predicted % 49   DLVA Predicted % 68   TLC L 3.12   TLC % Predicted % 64   RV % Predicted % 48   Personally reviewed and interpreted as normal spirometry with moderate restriction based on TLC with severely reduced DLCO.  WALK:      No data to display           Imaging: Personally  reviewed and as per EMR discussion this note CT high-res 03/2021.  This demonstrated on my interpretation scattered groundglass opacities with mild bronchiectasis scattered throughout concerning for ongoing inflammation from COVID-19 with the presence of bronchiectasis developing fibrosis although there is no frank fibrosis demonstrated on the scan.  Lab Results: Personally reviewed, no significant elevation of eosinophils and no anemia CBC    Component Value Date/Time   WBC 8.9 09/18/2022 1346   WBC 4.0 07/11/2022 1114   RBC 4.57 09/18/2022 1346   RBC 4.65 07/11/2022 1114   HGB 12.0 09/18/2022 1346   HCT 36.0 09/18/2022 1346   PLT 327 09/18/2022 1346   MCV 79 09/18/2022 1346   MCH 26.3 (L) 09/18/2022 1346   MCH 27.3 07/11/2022 1114   MCHC 33.3 09/18/2022 1346   MCHC 31.8 07/11/2022 1114   RDW 15.5 (H) 09/18/2022 1346   LYMPHSABS 1.1 06/21/2022 1116   EOSABS 0.2 06/21/2022 1116   BASOSABS 0.0 06/21/2022 1116    BMET    Component Value Date/Time   NA 138 07/11/2022 1114   NA 144 06/21/2022 1116   K 4.5 07/11/2022 1114   CL 104 07/11/2022 1114   CO2 28 07/11/2022 1114   GLUCOSE 99 07/11/2022 1114   BUN 17 07/11/2022 1114   BUN 17 06/21/2022 1116   CREATININE 0.96 07/11/2022 1114   CALCIUM 9.6 07/11/2022 1114   GFRNONAA >60 07/11/2022 1114   GFRAA 70 07/11/2020 1343    BNP No results found for: "BNP"  ProBNP No results found for: "PROBNP"  Specialty Problems   None  Allergies  Allergen Reactions   Ace Inhibitors Swelling    lips   Sulfa Antibiotics Hives and Itching   Triamterene Swelling   Amoxicillin Diarrhea   Benadryl [Diphenhydramine] Other (See Comments)    Difficulty swallowing   Dyazide [Hydrochlorothiazide W-Triamterene] Swelling   Lisinopril Swelling   Norvasc [Amlodipine Besylate] Swelling   Dilantin [Phenytoin Sodium Extended] Hives and Rash    Immunization History  Administered Date(s) Administered   Influenza,inj,Quad PF,6+ Mos 08/13/2018    Influenza-Unspecified 08/08/2014, 07/02/2019, 07/14/2021   Zoster, Live 09/07/2013    Past Medical History:  Diagnosis Date   Anemia    Asthma    Hypertension    Hypothyroidism    Multiple sclerosis ([place])    Neuromuscular disorder ([place])    Pre-diabetes    Vertigo    Vision abnormalities     Tobacco History: Social History   Tobacco Use  Smoking Status Never  Smokeless Tobacco Never   Counseling given: Not Answered   Continue to not smoke  Outpatient Encounter Medications as of 10/24/2022  Medication Sig   albuterol (VENTOLIN HFA) 108 (90 Base) MCG/ACT inhaler Inhale 2 puffs into  the lungs every 4 (four) hours as needed for wheezing or shortness of breath.   APIXABAN (ELIQUIS) VTE STARTER PACK ('10MG'$  AND '5MG'$ ) Take as directed on package: start with two-'5mg'$  tablets twice daily for 7 days. On day 8, switch to one-'5mg'$  tablet twice daily.   carvedilol (COREG) 6.25 MG tablet Take 6.25 mg by mouth 2 (two) times daily with a meal.   cetirizine (ZYRTEC ALLERGY) 10 MG tablet Take 1 tablet (10 mg total) by mouth daily. (Patient taking differently: Take 10 mg by mouth daily as needed for allergies.)   Fluticasone-Umeclidin-Vilant (TRELEGY ELLIPTA) 200-62.5-25 MCG/INH AEPB Inhale 1 puff into the lungs daily.   furosemide (LASIX) 20 MG tablet Take 20 mg by mouth 2 (two) times daily.   guaiFENesin 200 MG tablet Take 1 tablet (200 mg total) by mouth every 4 (four) hours as needed for cough or to loosen phlegm.   HYDROcodone-acetaminophen (NORCO/VICODIN) 5-325 MG tablet Take 1-2 tablets by mouth every 6 (six) hours as needed for moderate pain or severe pain (post op pain).   ipratropium (ATROVENT) 0.06 % nasal spray Place 2 sprays into both nostrils 2 (two) times daily. (Patient taking differently: Place 2 sprays into both nostrils 2 (two) times daily as needed for rhinitis.)   levothyroxine (SYNTHROID, LEVOTHROID) 50 MCG tablet Take 50 mcg by mouth daily before breakfast.   ocrelizumab 600  mg in sodium chloride 0.9 % 500 mL Inject 600 mg into the vein every 6 (six) months.   prednisoLONE acetate (PRED FORTE) 1 % ophthalmic suspension Place 1 drop into the right eye 4 (four) times a week.   Saline (SIMPLY SALINE) 0.9 % AERS Place 1 spray into the nose as needed (congestion).   timolol (BETIMOL) 0.5 % ophthalmic solution Place 1 drop into the right eye daily.   tiZANidine (ZANAFLEX) 4 MG tablet Take 1 tablet (4 mg total) by mouth every 6 (six) hours as needed for muscle spasms.   [DISCONTINUED] levofloxacin (LEVAQUIN) 750 MG tablet Take 1 tablet (750 mg total) by mouth daily.   No facility-administered encounter medications on file as of 10/24/2022.     Review of Systems  Review of Systems  N/a Physical Exam  BP 122/68 (BP Location: Left Arm, Patient Position: Sitting, Cuff Size: Normal)   Pulse 94   Temp 97.9 F (36.6 C) (Oral)   Wt 156 lb (70.8 kg)   LMP 01/01/2017 (Exact Date)   SpO2 98%   BMI 27.63 kg/m   Wt Readings from Last 5 Encounters:  10/24/22 156 lb (70.8 kg)  09/10/22 163 lb 6.4 oz (74.1 kg)  07/17/22 163 lb (73.9 kg)  07/11/22 163 lb (73.9 kg)  06/21/22 166 lb 11.2 oz (75.6 kg)    BMI Readings from Last 5 Encounters:  10/24/22 27.63 kg/m  09/10/22 28.95 kg/m  07/17/22 28.87 kg/m  07/11/22 28.87 kg/m  06/21/22 28.61 kg/m     Physical Exam General: Well-appearing, no acute distress Eyes: EOMI, no icterus Neck: Supple no JVP Cardiovascular: Regular rate and rhythm, no murmur Pulmonary: Inspiratory crackles left lower lung field, normal work of breathing Abdomen: Nondistended, bowel sounds present MSK: No synovitis, no joint effusion Neuro: Normal gait, no weakness Psych: Normal mood, full affect   Assessment & Plan:   Presumed asthma: Triggered by COVID-19 infection.  Breo without much improvement.  Trelegy did yield improvement in breathing and dyspnea on exertion as well as cough.  Have encouraged her to continue this as  maintenance therapy.  Rescue inhaler, albuterol,  to continue.  Incidentally discovered pulmonary embolus: Likely provoked in the setting of recent COVID infection as well as knee replacement.  Strong family history as well.  Continue Eliquis, about to finish starter pack and will go 5 mg twice daily.  Discussed minimal 36 months of treatment.  Will send prothrombin and factor V Leiden to evaluate for inherited causes of thrombophilia.  Consider stopping anticoagulation in the future and hematology evaluation for additional testing given strong family history.  Groundglass opacity on CT: In the setting of active infectious symptoms.  Has had infiltrates wax and wane in the past.  Do not plan for further follow-up.   Return in about 3 months (around 01/23/2023).   Lanier Clam, MD 10/25/2022   I spent 40 minutes in the care of the patient including face-to-face visit, coordination of care, review of records.

## 2022-10-29 LAB — PROTHROMBIN GENE MUTATION: PROTHROMBIN (FACTOR II): NEGATIVE

## 2022-10-29 LAB — FACTOR 5 LEIDEN: Result: NEGATIVE

## 2022-11-12 ENCOUNTER — Ambulatory Visit
Admission: RE | Admit: 2022-11-12 | Discharge: 2022-11-12 | Disposition: A | Discharge status: Home / Self Care | Payer: Medicare Other | Source: Ambulatory Visit | Attending: Internal Medicine | Admitting: Internal Medicine

## 2022-11-12 VITALS — BP 134/84 | HR 90 | Temp 98.0°F | Resp 16

## 2022-11-12 DIAGNOSIS — H6593 Unspecified nonsuppurative otitis media, bilateral: Secondary | ICD-10-CM | POA: Diagnosis not present

## 2022-11-12 MED ORDER — CETIRIZINE HCL 10 MG PO TABS: 10.0000 mg | ORAL_TABLET | Freq: Every day | ORAL | 0 refills | Status: AC

## 2022-11-12 MED ORDER — CEFDINIR 300 MG PO CAPS: 300.0000 mg | ORAL_CAPSULE | Freq: Two times a day (BID) | ORAL | 0 refills | Status: AC

## 2022-11-12 NOTE — ED Provider Notes (Addendum)
[name]    CSN: [id number] Arrival date & time: 11/12/22  1053      History   Chief Complaint Chief Complaint  Patient presents with   Otalgia    HPI MAZIE FENCL is a 57 y.o. female.   Patient presents with bilateral ear pain that started about 3 to 4 days ago.  Patient reports that she does have associated nasal congestion and cough but these are not new symptoms as she has "long COVID syndrome".  Denies trauma, foreign body, ear drainage, decreased hearing from the ears.  Denies any associated fever.   Otalgia   Past Medical History:  Diagnosis Date   Anemia    Asthma    Hypertension    Hypothyroidism    Multiple sclerosis ([place])    Neuromuscular disorder ([place])    Pre-diabetes    Vertigo    Vision abnormalities     Patient Active Problem List   Diagnosis Date Noted   Primary osteoarthritis of left knee 07/17/2022   High risk medication use 01/05/2020   Left knee pain 01/05/2020   Atypical facial pain 07/08/2019   Vitamin D deficiency 07/07/2018   Multiple sclerosis ([place]) 10/11/2016   Gait disturbance 10/11/2016   Urge incontinence of urine 10/11/2016   Numbness 10/11/2016   Corneal graft malfunction 11/21/2011   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      Home Medications    Prior to Admission medications   Medication Sig Start Date End Date Taking? Authorizing Provider  cefdinir (OMNICEF) 300 MG capsule Take 1 capsule (300 mg total) by mouth 2 (two) times daily for 5 days. 11/12/22 11/17/22 Yes Otilio Groleau, Michele Rockers, FNP  cetirizine (ZYRTEC) 10 MG tablet Take 1 tablet (10 mg total) by mouth daily. 11/12/22  Yes Kourtland Coopman, Michele Rockers, FNP   albuterol (VENTOLIN HFA) 108 (90 Base) MCG/ACT inhaler Inhale 2 puffs into the lungs every 4 (four) hours as needed for wheezing or shortness of breath. 06/15/22   Hunsucker, Bonna Gains, MD  APIXABAN Arne [place]) VTE STARTER PACK ('10MG'$  AND '5MG'$ ) Take as directed on package: start with two-'5mg'$  tablets twice daily for 7 days. On day 8, switch to one-'5mg'$  tablet twice daily. 10/17/22   Hunsucker, Bonna Gains, MD  carvedilol (COREG) 6.25 MG tablet Take 6.25 mg by mouth 2 (two) times daily with a meal.    [provider]  cetirizine (ZYRTEC ALLERGY) 10 MG tablet Take 1 tablet (10 mg total) by mouth daily. Patient taking differently: Take 10 mg by mouth daily as needed for allergies. 05/02/21   Hunsucker, Bonna Gains, MD  Fluticasone-Umeclidin-Vilant (TRELEGY ELLIPTA) 200-62.5-25 MCG/ACT AEPB Inhale 1 puff into the lungs daily. 10/25/22   Hunsucker, Bonna Gains, MD  Fluticasone-Umeclidin-Vilant (TRELEGY ELLIPTA) 200-62.5-25 MCG/INH AEPB Inhale 1 puff into the lungs daily. 03/29/21   Hunsucker, Bonna Gains, MD  furosemide (LASIX) 20 MG tablet Take 20 mg by mouth 2 (two) times daily. 06/21/22   [provider]  guaiFENesin 200 MG tablet Take 1 tablet (200 mg total) by mouth every 4 (four) hours as needed for cough or to loosen phlegm. 04/06/22   Teodora Medici, FNP  HYDROcodone-acetaminophen (NORCO/VICODIN) 5-325 MG tablet Take 1-2 tablets by mouth every 6 (six) hours as needed for moderate pain or severe pain (post op pain). 07/17/22 07/17/23  Loni Dolly, PA-C  ipratropium (ATROVENT) 0.06 % nasal spray Place 2 sprays into both nostrils 2 (two) times daily. Patient taking differently: Place 2 sprays into both nostrils 2 (two) times daily as needed for rhinitis. 09/12/21   Hunsucker, Bonna Gains, MD  levothyroxine (SYNTHROID, LEVOTHROID) 50 MCG tablet Take 50 mcg by mouth daily before breakfast.    [provider]  ocrelizumab 600 mg in sodium chloride 0.9 % 500 mL Inject 600 mg into the vein every 6 (six)  months.    [provider]  prednisoLONE acetate (PRED FORTE) 1 % ophthalmic suspension Place 1 drop into the right eye 4 (four) times a week.    [provider]  Saline (SIMPLY SALINE) 0.9 % AERS Place 1 spray into the nose as needed (congestion).    [provider]  timolol (BETIMOL) 0.5 % ophthalmic solution Place 1 drop into the right eye daily.    [provider]  tiZANidine (ZANAFLEX) 4 MG tablet Take 1 tablet (4 mg total) by mouth every 6 (six) hours as needed for muscle spasms. 07/17/22 07/17/23  Loni Dolly, PA-C    Family History Family History  Problem Relation Age of Onset   Hyperlipidemia Mother    Hypertension Mother    Stroke Father    Diabetes Father     Social History Social History   Tobacco Use   Smoking status: Never   Smokeless tobacco: Never  Vaping Use   Vaping Use: Never used  Substance Use Topics   Alcohol use: Never    Alcohol/week: 0.0 standard drinks of alcohol   Drug use: Never     Allergies   Ace inhibitors, Sulfa antibiotics, Triamterene, Amoxicillin, Benadryl [diphenhydramine], Dyazide [hydrochlorothiazide w-triamterene], Lisinopril, Norvasc [amlodipine besylate], and Dilantin [phenytoin sodium extended]   Review of Systems Review of Systems Per HPI  Physical Exam Triage Vital Signs ED Triage Vitals  Enc Vitals Group     BP 11/12/22 1118 134/84     Pulse Rate 11/12/22 1117 90     Resp 11/12/22 1117 16     Temp 11/12/22 1117 98 F (36.7 C)     Temp Source 11/12/22 1117 Oral     SpO2 11/12/22 1117 98 %     Weight --      Height --      Head Circumference --      Peak Flow --      Pain Score 11/12/22 1117 3     Pain Loc --      Pain Edu? --      Excl. in [place]? --    No data found.  Updated Vital Signs BP 134/84 (BP Location: Left Arm)   Pulse 90   Temp 98 F (36.7 C) (Oral)   Resp 16   LMP 01/01/2017 (Exact Date)   SpO2 98%   Visual Acuity Right Eye Distance:   Left Eye Distance:    Bilateral Distance:    Right Eye Near:   Left Eye Near:    Bilateral Near:     Physical Exam Constitutional:      General: She is not in acute distress.    Appearance: Normal appearance. She is not toxic-appearing or diaphoretic.  HENT:     Head: Normocephalic and atraumatic.     Right Ear: No drainage, swelling or tenderness.  A middle ear effusion is present. There is no impacted cerumen. Tympanic membrane is not perforated, erythematous or bulging.     Left Ear: No drainage, swelling or tenderness. A middle ear effusion is present. There is no impacted cerumen. Tympanic membrane is not perforated, erythematous or bulging.  Eyes:     Extraocular Movements: Extraocular movements intact.     Conjunctiva/sclera: Conjunctivae normal.  Pulmonary:     Effort: Pulmonary effort is normal.  Neurological:     General: No focal deficit present.     Mental Status: She is alert and oriented to person, place, and time. Mental status is at baseline.  Psychiatric:        Mood and Affect: Mood normal.        Behavior: Behavior normal.        Thought Content: Thought content normal.        Judgment: Judgment normal.      UC Treatments / Results  Labs (all labs ordered are listed, but only abnormal results are displayed) Labs Reviewed - No data to display  EKG   Radiology No results found.  Procedures Procedures (including critical care time)  Medications Ordered in UC Medications - No data to display  Initial Impression / Assessment and Plan / UC Course  I have reviewed the triage vital signs and the nursing notes.  Pertinent labs & imaging results that were available during my care of the patient were reviewed by me and considered in my medical decision making (see chart for details).     Patient has fluid behind TMs which is most likely causing her discomfort.  Patient not able to tolerate nasal steroids so will defer this.  Patient able to tolerate cetirizine so this was  prescribed.  Systemic steroids risky given recent surgery and comorbidities so will defer this as well.  Will add cefdinir antibiotic given limited management for fluid behind TMs to see if this will be helpful.  Patient advised to follow-up if symptoms persist or worsen.  Patient verbalized understanding and was agreeable with plan.  States she is allergic to amoxicillin but it causes diarrhea so cephalosporin should be safe. Final Clinical Impressions(s) / UC Diagnoses   Final diagnoses:  Fluid level behind tympanic membrane of both ears     Discharge Instructions      You have fluid behind your eardrums which is being treated with an antibiotic.  Please follow-up if any symptoms persist or worsen.    ED Prescriptions     Medication Sig Dispense Auth. Provider   cefdinir (OMNICEF) 300 MG capsule Take 1 capsule (300 mg total) by mouth 2 (two) times daily for 5 days. 10 capsule Oswaldo Conroy E, [place]   cetirizine (ZYRTEC) 10 MG tablet Take 1 tablet (10 mg total) by mouth daily. 30 tablet [place], Michele Rockers, [place]      PDMP not reviewed this encounter.   Teodora Medici, [place] 11/12/22 [place], [place], [place] 11/12/22 1240

## 2022-11-12 NOTE — ED Triage Notes (Signed)
Pt c/o bilateral otalgia onset ~ Friday

## 2022-11-12 NOTE — Discharge Instructions (Addendum)
You have fluid behind your eardrums which is being treated with an antibiotic.  Please follow-up if any symptoms persist or worsen.

## 2022-11-15 ENCOUNTER — Other Ambulatory Visit: Payer: Self-pay | Admitting: Obstetrics and Gynecology

## 2022-11-20 ENCOUNTER — Telehealth: Payer: Self-pay | Admitting: Pulmonary Disease

## 2022-11-20 NOTE — Telephone Encounter (Signed)
Pt following up about Eliquis medication

## 2022-11-21 ENCOUNTER — Telehealth: Payer: Self-pay | Admitting: Pulmonary Disease

## 2022-11-21 DIAGNOSIS — K769 Liver disease, unspecified: Secondary | ICD-10-CM

## 2022-11-21 MED ORDER — APIXABAN 5 MG PO TABS: 5.0000 mg | ORAL_TABLET | Freq: Two times a day (BID) | ORAL | 4 refills | Status: DC

## 2022-11-21 NOTE — Telephone Encounter (Signed)
Spoke with the pt She states that she has taken her last dose of Eliquis today  She says she was told at her last visit that Dr Silas Flood would call the rep and find out a way to get her med cheaper  She had to pay $200 last fill  She can not afford this  I gave her the phone number for the pt assistance [phone]-[phone]  She was upset and says this is not what she was told would happen by the provider  She is also asking status of her pt assistance for Trelegy  She states she gave form to nurse during last visit   Please advise, thanks

## 2022-11-21 NOTE — Telephone Encounter (Signed)
Attempted to call pt but unable to reach. Left message for her to return call. 

## 2022-11-21 NOTE — Telephone Encounter (Signed)
Discussed with patient. She is talking with the patient assistance representatives. New prescription today for Eliquis 5 mg BID. Advised she could contact PCP to see if they have samples.   CT abdomen and plevis with contrast ordered to evaluate liver and kidney spots seen on recent CT chest - she expressed understanding. Routing back to triage as FYI.  [place], could you follow up if able to see if she needs paper form filled out for medication assistance or if copay will go down with deductible? Thanks!

## 2022-11-22 ENCOUNTER — Encounter: Payer: Self-pay | Admitting: Pulmonary Disease

## 2022-11-22 ENCOUNTER — Other Ambulatory Visit (HOSPITAL_COMMUNITY): Payer: Self-pay

## 2022-12-20 ENCOUNTER — Encounter: Payer: Self-pay | Admitting: Neurology

## 2022-12-20 ENCOUNTER — Ambulatory Visit: Payer: Medicare Other | Admitting: Neurology

## 2022-12-20 VITALS — BP 141/88 | HR 70 | Ht 63.0 in | Wt 156.6 lb

## 2022-12-20 DIAGNOSIS — N3941 Urge incontinence: Secondary | ICD-10-CM

## 2022-12-20 DIAGNOSIS — G4700 Insomnia, unspecified: Secondary | ICD-10-CM | POA: Diagnosis not present

## 2022-12-20 DIAGNOSIS — R269 Unspecified abnormalities of gait and mobility: Secondary | ICD-10-CM

## 2022-12-20 DIAGNOSIS — Z79899 Other long term (current) drug therapy: Secondary | ICD-10-CM

## 2022-12-20 DIAGNOSIS — G35 Multiple sclerosis: Secondary | ICD-10-CM | POA: Diagnosis not present

## 2022-12-20 DIAGNOSIS — R5383 Other fatigue: Secondary | ICD-10-CM

## 2022-12-20 NOTE — Progress Notes (Signed)
[facility]  [facility]  PATIENT: Pamela Potter DOB: 1965-12-31  REFERRING DOCTOR OR PCP:  Dr. Starleen Blue    Dr. Maude Leriche is PCP [name] SOURCE: patient, records from Dr. Starleen Blue. Laboratory results, MRI results, multiple MRI images on PACS  _________________________________   HISTORICAL  CHIEF COMPLAINT:  Chief Complaint  Patient presents with   Follow-up    RM 10, alone. Last seen 06/21/22. MS DMT: Ocrevus. Husband just retired. Recently applied for Medicare. Not eligible for copay assistance. Provided updated insurance cards to intrafusion today. Next infusion 02/05/2023.     HISTORY OF PRESENT ILLNESS:  Pamela Potter is a 57 y.o. woman with relapsing remitting multiple sclerosis.  Update 12/20/2022 She is on Ocrevus. Her last infusion was 08/07/2022.  She feels her MS is doing ok and has no recent exacerbation or new MS symptoms.   She tolerates Ocrevus well.   IgG was low normal at 606 at the last check in November 2023  Gait is stable.   She is a litle off balanced but does not need a cane  She does worse in heat. She needs to hold the bannister on stairs.    She feels her right leg does worse than her left leg.     She gets some tingling in her feet but this is stable.    She has urinary urgency and occasional incontinence and wears pads.    She had a TI last year.   No hesitancy.     Vision has done well.   She has keratoconus and wears special contacts.   She had OD surgery  She has daily fatigue taht has seemed worse since Covid in late 2021.   She is a little better but then had Covid last Oct/Nov.      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  She often does not fall asleep for a few hours and ten sleeps in late.    She is often hot at night and has trouble getting comfortable.  She twists a lot at night even while asleep.     She never tried the  trazodone .  She denies depression.    She notes verbal fluency ans short term memory  issues are worse.    She has right hip pain and left knee pain. She had left knee replacement 1o/2023.  She is on an anticoagulant for PE (she never had a DVT in leg that she was aware of).       MS History: She was diagnosed with MS in 1991 after presenting with gait disturbance, weakness and vertigo. An MRI of the brain showed plaques and a lumbar puncture were reportedly consistent with MS. She saw Dr. Adolphus Birchwood a couple years later and was started her on Avonex.   She stayed on Avonex for about 10 years but then had new lesions on her MRI. She was referred to Dr. Jacqulynn Cadet. He placed her on Tysabri and she stayed on for a few years. 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 Unfortunately, on Gilenya, she had some MRI progression and Tysabri was restarted. . Of note, her JCV antibody titers were initially less than 0.9. However, over the last couple years her titers have increased and her last JCV antibody was 1.03. She was switched to ocrelizumab and had her first dose the end of June 2017 and her second dose in mid July.  She has not noted any exacerbations while on ocrelizumab.   DATA: MRI images from 02/10/2016 show classic T2/FLAIR hyperintense foci in the periventricular, juxtacortical and deep white matter of both hemispheres and also in the pons and right middle cerebellar peduncle in a pattern and configuration consistent with chronic demyelinating plaque associated with MS.   There were no acute findings and no change when compared to the previous MRI, from 05/17/2014  The MRI of the cervical and thoracic spine 2017 showed foci in the upper cervical spine and in the mid to lower thoracic spine. None of these were acute.      I reviewed her labs. Her JCV antibody on 12/25/2015 was 1.03 (middle positive). She had normal CD4 and CD8 cells.   Hepatitis B labs and Quantiferon TB test were negative or nonreactive.  MRI 02/12/2020  cervical spine:  Patchy T2 hyperintense foci within the upper cervical spine as described above.  None of the foci appear to be acute and there are no definite new lesions compared to the previous MRI from 2015.   Small left paramedian disc protrusion at C6-C7, not present on the 2015 MRI, that does not lead to nerve root compression or spinal stenosis  MRI 02/12/2020 Brain:  Multiple T2/FLAIR hyperintense foci in the brainstem, middle cerebellar peduncles and hemispheres in a pattern and configuration consistent with chronic demyelinating plaque associated with multiple sclerosis.  None of the foci appear to be acute.  Compared to the MRI from 01/08/2018, there are no new lesions.    REVIEW OF SYSTEMS: Constitutional: No fevers, chills, sweats, or change in appetite.  Reports fatigue eyes: as abvove Ear, nose and throat: No hearing loss, ear pain, nasal congestion, sore throat Cardiovascular: No chest pain, palpitations Respiratory:  No shortness of breath at rest or with exertion.   No wheezes GastrointestinaI: No nausea, vomiting, diarrhea, abdominal pain, fecal incontinence Genitourinary:  Notes frequency and nocturia with occasional urge incontinence Musculoskeletal:  No neck pain, back pain Integumentary: No rash, pruritus, skin lesions Neurological: as above Psychiatric: No depression at this time.  She denies anxiety. Endocrine: No palpitations, diaphoresis, change in appetite, change in weigh or increased thirst Hematologic/Lymphatic:  No anemia, purpura, petechiae. Allergic/Immunologic: No itchy/runny eyes, nasal congestion, recent allergic reactions, rashes  ALLERGIES: Allergies  Allergen Reactions   Ace Inhibitors Swelling    lips   Sulfa Antibiotics Hives and Itching   Triamterene Swelling   Amoxicillin Diarrhea   Benadryl [Diphenhydramine] Other (See Comments)    Difficulty swallowing   Dyazide [Hydrochlorothiazide W-Triamterene] Swelling   Lisinopril Swelling   Norvasc  [Amlodipine Besylate] Swelling   Dilantin [Phenytoin Sodium Extended] Hives and Rash    HOME MEDICATIONS:  Current Outpatient Medications:    albuterol (VENTOLIN HFA) 108 (90 Base) MCG/ACT inhaler, Inhale 2 puffs into the lungs every 4 (four) hours as needed for wheezing or shortness of breath., Disp: 1 each, Rfl: 11   apixaban (ELIQUIS) 5 MG TABS tablet, Take 1 tablet (5 mg total) by mouth 2 (two) times daily., Disp: 60 tablet, Rfl: 4   carvedilol (COREG) 6.25 MG tablet, Take 6.25 mg by mouth 2 (two) times daily with a meal., Disp: , Rfl:    cetirizine (ZYRTEC) 10 MG tablet, Take 1 tablet (10 mg total) by mouth daily., Disp: 30 tablet, Rfl: 0   Fluticasone-Umeclidin-Vilant (TRELEGY ELLIPTA) 200-62.5-25 MCG/ACT AEPB, Inhale 1 puff into the lungs daily., Disp: 60 each, Rfl: 12   Fluticasone-Umeclidin-Vilant (TRELEGY ELLIPTA) 200-62.5-25 MCG/INH AEPB, Inhale 1 puff  into the lungs daily., Disp: 60 each, Rfl: 11   furosemide (LASIX) 20 MG tablet, Take 20 mg by mouth 2 (two) times daily., Disp: , Rfl:    levothyroxine (SYNTHROID, LEVOTHROID) 50 MCG tablet, Take 50 mcg by mouth daily before breakfast., Disp: , Rfl:    ocrelizumab 600 mg in sodium chloride 0.9 % 500 mL, Inject 600 mg into the vein every 6 (six) months., Disp: , Rfl:    Saline (SIMPLY SALINE) 0.9 % AERS, Place 1 spray into the nose as needed (congestion)., Disp: , Rfl:   PAST MEDICAL HISTORY: Past Medical History:  Diagnosis Date   Anemia    Asthma    Hypertension    Hypothyroidism    Multiple sclerosis ([place])    Neuromuscular disorder ([place])    Pre-diabetes    Vertigo    Vision abnormalities     PAST SURGICAL HISTORY: Past Surgical History:  Procedure Laterality Date   COLONOSCOPY  2023   EYE SURGERY  2017   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   Procedure: LEFT TOTAL KNEE ARTHROPLASTY;  Surgeon: Melrose Nakayama, MD;  Location: [facility];  Service: Orthopedics;  Laterality: Left;    FAMILY HISTORY: Family History   Problem Relation Age of Onset   Hyperlipidemia Mother    Hypertension Mother    Stroke Father    Diabetes Father     SOCIAL HISTORY:  Social History   Socioeconomic History   Marital status: Married    Spouse name: Not on file   Number of children: Not on file   Years of education: Not on file   Highest education level: Not on file  Occupational History   Not on file  Tobacco Use   Smoking status: Never   Smokeless tobacco: Never  Vaping Use   Vaping Use: Never used  Substance and Sexual Activity   Alcohol use: Never    Alcohol/week: 0.0 standard drinks of alcohol   Drug use: Never   Sexual activity: Not on file  Other Topics Concern   Not on file  Social History Narrative   Not on file   Social Determinants of Health   Financial Resource Strain: Not on file  Food Insecurity: Not on file  Transportation Needs: Not on file  Physical Activity: Not on file  Stress: Not on file  Social Connections: Not on file  Intimate Partner Violence: Not on file     PHYSICAL EXAM  Vitals:   12/20/22 1459 12/20/22 1509  BP: (!) 141/85 (!) 141/88  Pulse: 70 70  Weight: 156 lb 9.6 oz (71 kg)   Height: '5\' 3"'$  (1.6 m)     Body mass index is 27.74 kg/m.   General: The patient is well-developed and well-nourished and in no acute distress   Neurologic Exam  Mental status: The patient is alert and oriented x 3 at the time of the examination. The patient has apparent normal recent and remote memory, with an apparently normal attention span and concentration ability.   Speech is normal.  Cranial nerves: Extraocular movements are full.  Facial strength and sensation are normal.  Trapezius strength is good.   No obvious hearing deficits are noted.  Motor:  Muscle bulk is normal but tone is mildly increased in legs.. Strength is  5 / 5 in all 4 extremities except 4 distal right foot.   Sensory: Intact touch and vibration in arms and legs  Coordination:   Good bilateral FTN  but reduced heel to shin bilaterally  Gait and station: Station is normal.   Her gait is wide.  Tandem gait is very wide.  She walks better with support.. Mild right foot drop.  Romberg is negative.   Reflexes: Deep tendon reflexes are symmetric and normal in both arms. DTRs are increased in legs with spread at the knees.        Multiple sclerosis ([place]) - Plan: IgG, IgA, IgM, CBC with Differential/Platelet  High risk medication use - Plan: IgG, IgA, IgM, CBC with Differential/Platelet  Insomnia, unspecified type  Gait disturbance  Urge incontinence of urine  Other fatigue   1.   Continue Ocrevus for RRMS.     We will check CBC/differential and IgG/IgM today 2.    Stay active and exercise as tolerated.    3.    Due to physical and cognitive impairment and fatigue she is disabled and unable to return to work.   4.     rtc 6 months or sooner if ne or worsening issues  This visit is part of a comprehensive longitudinal care medical relationship regarding the patients primary diagnosis of multiple sclerosis and related concerns.  Terrill Wauters A. Felecia Shelling, MD, PhD, FAAN Certified in Neurology, Clinical Neurophysiology, Sleep Medicine, Pain Medicine and Neuroimaging Director, [facility] [address], [place], [place] [address] [phone]

## 2022-12-21 LAB — CBC WITH DIFFERENTIAL/PLATELET
Basophils Absolute: 0 10*3/uL (ref 0.0–0.2)
Basos: 0 %
EOS (ABSOLUTE): 0.2 10*3/uL (ref 0.0–0.4)
Eos: 4 %
Hematocrit: 34.8 % (ref 34.0–46.6)
Hemoglobin: 11 g/dL — ABNORMAL LOW (ref 11.1–15.9)
Immature Grans (Abs): 0 10*3/uL (ref 0.0–0.1)
Immature Granulocytes: 0 %
Lymphocytes Absolute: 1.1 10*3/uL (ref 0.7–3.1)
Lymphs: 23 %
MCH: 25.3 pg — ABNORMAL LOW (ref 26.6–33.0)
MCHC: 31.6 g/dL (ref 31.5–35.7)
MCV: 80 fL (ref 79–97)
Monocytes Absolute: 0.3 10*3/uL (ref 0.1–0.9)
Monocytes: 8 %
Neutrophils Absolute: 2.9 10*3/uL (ref 1.4–7.0)
Neutrophils: 65 %
Platelets: 382 10*3/uL (ref 150–450)
RBC: 4.34 x10E6/uL (ref 3.77–5.28)
RDW: 15.8 % — ABNORMAL HIGH (ref 11.7–15.4)
WBC: 4.5 10*3/uL (ref 3.4–10.8)

## 2022-12-21 LAB — IGG, IGA, IGM
IgA/Immunoglobulin A, Serum: 54 mg/dL — ABNORMAL LOW (ref 87–352)
IgG (Immunoglobin G), Serum: 697 mg/dL (ref 586–1602)
IgM (Immunoglobulin M), Srm: 50 mg/dL (ref 26–217)

## 2023-01-07 ENCOUNTER — Ambulatory Visit (HOSPITAL_COMMUNITY): Payer: Medicare Other

## 2023-01-10 ENCOUNTER — Ambulatory Visit (HOSPITAL_COMMUNITY)
Admission: RE | Admit: 2023-01-10 | Discharge: 2023-01-10 | Disposition: A | Discharge status: Home / Self Care | Payer: Medicare Other | Source: Ambulatory Visit | Attending: Pulmonary Disease | Admitting: Pulmonary Disease

## 2023-01-10 DIAGNOSIS — K769 Liver disease, unspecified: Secondary | ICD-10-CM | POA: Insufficient documentation

## 2023-01-10 MED ORDER — IOHEXOL 300 MG/ML  SOLN
100.0000 mL | Freq: Once | INTRAMUSCULAR | Status: AC | PRN
  Administered 2023-01-10: 100 mL via INTRAVENOUS

## 2023-01-10 MED ORDER — SODIUM CHLORIDE (PF) 0.9 % IJ SOLN
INTRAMUSCULAR | Status: AC
  Filled 2023-01-10: qty 50

## 2023-01-17 ENCOUNTER — Encounter: Payer: Self-pay | Admitting: Pulmonary Disease

## 2023-01-17 ENCOUNTER — Ambulatory Visit: Payer: Medicare Other | Admitting: Pulmonary Disease

## 2023-01-17 VITALS — BP 124/72 | HR 95 | Wt 157.0 lb

## 2023-01-17 DIAGNOSIS — I2699 Other pulmonary embolism without acute cor pulmonale: Secondary | ICD-10-CM

## 2023-01-17 DIAGNOSIS — J4540 Moderate persistent asthma, uncomplicated: Secondary | ICD-10-CM | POA: Diagnosis not present

## 2023-01-17 MED ORDER — PREDNISONE 20 MG PO TABS: ORAL_TABLET | ORAL | 0 refills | Status: AC

## 2023-01-17 NOTE — Progress Notes (Signed)
 @Patient  ID: Pamela Potter, female    DOB: [birth year], 57 y.o.   MRN: [id number]  Chief Complaint  Patient presents with   Follow-up    Pt si here for follow up for cough. Pt states for the past 2-3 weeks she is having a lot of congestion. She states that it is really bad dark colored mucus. Nasal drainage and congestion. Pt states that she has been taking zyrtec and saline but nothing is helping     Referring provider: Johny BlamerHarris, William, MD  HPI:   57 y.o. whom we are seeing in follow-up for evaluation of dyspnea on exertion and cough since COVID-19 infection 09/2020.  Incidentally discovered pulmonary embolus 10/2022.  Overall doing well.  Eliquis much more affordable.  Trelegy still expensive.  She had a lot of sinus congestion recently.  Completed a course of doxycycline.  Has not really helped.  Breathing seems relatively stable.  But a lot of worsening cough or sputum production.  A lot of nasal drainage both postnasal and rhinorrhea.  HPI at initial visit Patient contracted COVID 09/2020.  She received antibody infusion.  She had COVID-vaccine x3 prior to infection.  Symptoms were nasal congestion, sore throat.  Mild dyspnea.  Though symptoms gradually improved except congestion in the nasal passages has persisted.  Dyspnea exertion has worsened.  Dyspnea worse up inclines or stairs.  Can be present on flat surfaces as well.  No timing during day when things are better or worse.  No seasonal or environmental changes make things better or worse.  She did receive prednisone a few days ago and this seemed to help somewhat.  Similarly, no rhyme or reason to her cough.  No relieving or exacerbating factors that she can readily identify.  No timing during day were things are better or worse.  No seasonal or environmental changes.  With the symptoms she went to the ED 11/2020 and had a CTA chest PE protocol performed which on my interpretation reveals no evidence of PE, scattered faint groundglass  opacities bilaterally throughout all lobes consistent with atypical pneumonia versus resolving COVID-19 pneumonia.  She was given doxycycline.  Per neurology notes 12/2020 symptoms improved quite a bit with this intervention.  However patient does not recall this and states nothing is really helped.  PMH: Hypertension, MS Surgical history: Reviewed reviewed with patient, she denies surgeries Family history: Reviewed with patient, she denies respiratory illnesses in first-degree relatives Social history: Never smoker, lives with husband, [place]  Questionaires / Pulmonary Flowsheets:   ACT:  Asthma Control Test ACT Total Score  05/08/2022 11:00 AM 17   MMRC:     No data to display           Epworth:      No data to display           Tests:   FENO:  No results found for: "NITRICOXIDE"  PFT:    Latest Ref Rng & Units 02/20/2021   11:47 AM  PFT Results  FVC-Pre L 2.35   FVC-Predicted Pre % 90   FVC-Post L 2.34   FVC-Predicted Post % 90   Pre FEV1/FVC % % 87   Post FEV1/FCV % % 88   FEV1-Pre L 2.03   FEV1-Predicted Pre % 99   FEV1-Post L 2.06   DLCO uncorrected ml/min/mmHg 9.65   DLCO UNC% % 49   DLCO corrected ml/min/mmHg 9.65   DLCO COR %Predicted % 49   DLVA Predicted % 68  TLC L 3.12   TLC % Predicted % 64   RV % Predicted % 48   Personally reviewed and interpreted as normal spirometry with moderate restriction based on TLC with severely reduced DLCO.  WALK:      No data to display           Imaging: Personally reviewed and as per EMR discussion this note CT high-res 03/2021.  This demonstrated on my interpretation scattered groundglass opacities with mild bronchiectasis scattered throughout concerning for ongoing inflammation from COVID-19 with the presence of bronchiectasis developing fibrosis although there is no frank fibrosis demonstrated on the scan.  Lab Results: Personally reviewed, no significant elevation of eosinophils and  no anemia CBC    Component Value Date/Time   WBC 4.5 12/20/2022 1532   WBC 4.0 07/11/2022 1114   RBC 4.34 12/20/2022 1532   RBC 4.65 07/11/2022 1114   HGB 11.0 (L) 12/20/2022 1532   HCT 34.8 12/20/2022 1532   PLT 382 12/20/2022 1532   MCV 80 12/20/2022 1532   MCH 25.3 (L) 12/20/2022 1532   MCH 27.3 07/11/2022 1114   MCHC 31.6 12/20/2022 1532   MCHC 31.8 07/11/2022 1114   RDW 15.8 (H) 12/20/2022 1532   LYMPHSABS 1.1 12/20/2022 1532   EOSABS 0.2 12/20/2022 1532   BASOSABS 0.0 12/20/2022 1532    BMET    Component Value Date/Time   NA 138 07/11/2022 1114   NA 144 06/21/2022 1116   K 4.5 07/11/2022 1114   CL 104 07/11/2022 1114   CO2 28 07/11/2022 1114   GLUCOSE 99 07/11/2022 1114   BUN 17 07/11/2022 1114   BUN 17 06/21/2022 1116   CREATININE 0.96 07/11/2022 1114   CALCIUM 9.6 07/11/2022 1114   GFRNONAA >60 07/11/2022 1114   GFRAA 70 07/11/2020 1343    BNP No results found for: "BNP"  ProBNP No results found for: "PROBNP"  Specialty Problems   None  Allergies  Allergen Reactions   Ace Inhibitors Swelling    lips   Sulfa Antibiotics Hives and Itching   Triamterene Swelling   Amoxicillin Diarrhea   Benadryl [Diphenhydramine] Other (See Comments)    Difficulty swallowing   Dyazide [Hydrochlorothiazide W-Triamterene] Swelling   Lisinopril Swelling   Norvasc [Amlodipine Besylate] Swelling   Dilantin [Phenytoin Sodium Extended] Hives and Rash    Immunization History  Administered Date(s) Administered   Influenza,inj,Quad PF,6+ Mos 08/13/2018   Influenza-Unspecified 08/08/2014, 07/02/2019, 07/14/2021   Zoster, Live 09/07/2013    Past Medical History:  Diagnosis Date   Anemia    Asthma    Hypertension    Hypothyroidism    Multiple sclerosis    Neuromuscular disorder    Pre-diabetes    Vertigo    Vision abnormalities     Tobacco History: Social History   Tobacco Use  Smoking Status Never  Smokeless Tobacco Never   Counseling given: Not  Answered   Continue to not smoke  Outpatient Encounter Medications as of 01/17/2023  Medication Sig   albuterol (VENTOLIN HFA) 108 (90 Base) MCG/ACT inhaler Inhale 2 puffs into the lungs every 4 (four) hours as needed for wheezing or shortness of breath.   apixaban (ELIQUIS) 5 MG TABS tablet Take 1 tablet (5 mg total) by mouth 2 (two) times daily.   carvedilol (COREG) 6.25 MG tablet Take 6.25 mg by mouth 2 (two) times daily with a meal.   cetirizine (ZYRTEC) 10 MG tablet Take 1 tablet (10 mg total) by mouth daily.   Fluticasone-Umeclidin-Vilant (TRELEGY  ELLIPTA) 200-62.5-25 MCG/ACT AEPB Inhale 1 puff into the lungs daily.   furosemide (LASIX) 20 MG tablet Take 20 mg by mouth 2 (two) times daily.   levothyroxine (SYNTHROID, LEVOTHROID) 50 MCG tablet Take 50 mcg by mouth daily before breakfast.   ocrelizumab 600 mg in sodium chloride 0.9 % 500 mL Inject 600 mg into the vein every 6 (six) months.   predniSONE (DELTASONE) 20 MG tablet Take 2 tablets (40 mg total) by mouth daily with breakfast for 5 days, THEN 1 tablet (20 mg total) daily with breakfast for 5 days.   Saline (SIMPLY SALINE) 0.9 % AERS Place 1 spray into the nose as needed (congestion).   [DISCONTINUED] Fluticasone-Umeclidin-Vilant (TRELEGY ELLIPTA) 200-62.5-25 MCG/INH AEPB Inhale 1 puff into the lungs daily.   No facility-administered encounter medications on file as of 01/17/2023.     Review of Systems  Review of Systems  N/a Physical Exam  BP 124/72 (BP Location: Left Arm, Patient Position: Sitting, Cuff Size: Normal)   Pulse 95   Wt 157 lb (71.2 kg)   LMP 01/01/2017 (Exact Date)   SpO2 99%   BMI 27.81 kg/m   Wt Readings from Last 5 Encounters:  01/17/23 157 lb (71.2 kg)  12/20/22 156 lb 9.6 oz (71 kg)  10/24/22 156 lb (70.8 kg)  09/10/22 163 lb 6.4 oz (74.1 kg)  07/17/22 163 lb (73.9 kg)    BMI Readings from Last 5 Encounters:  01/17/23 27.81 kg/m  12/20/22 27.74 kg/m  10/24/22 27.63 kg/m  09/10/22  28.95 kg/m  07/17/22 28.87 kg/m     Physical Exam General: Well-appearing, no acute distress Eyes: EOMI, no icterus Neck: Supple no JVP Cardiovascular: Regular rate and rhythm, no murmur Pulmonary: Inspiratory crackles left lower lung field, normal work of breathing Abdomen: Nondistended, bowel sounds present MSK: No synovitis, no joint effusion Neuro: Normal gait, no weakness Psych: Normal mood, full affect   Assessment & Plan:   Presumed asthma: Triggered by COVID-19 infection.  ICS/LABA without much improvement.  Continue Trelegy as able.  Cost is a major barrier.  Incidentally discovered pulmonary embolus: Likely provoked in the setting of recent COVID infection as well as knee replacement.  Strong family history as well.  Continue Eliquis, about to finish starter pack and will go 5 mg twice daily.  Discussed minimal 36 months of treatment.  Consider lifelong anticoagulation given her family history.  Continue discussed at next visit.  Continue Eliquis for now  Sinus congestion: Failed antibiotics.  Likely noninfectious sinusitis.  Prednisone taper 40 mg for 5 days then 20 mg for 5 days sent today.  Renal lesions: Recent CT reviewed.  Hemangioma and simple cyst.  No further follow-up recommended per radiology recommendation.   Return in about 3 months (around 04/18/2023) for f/u Dr. Judeth Horn.   Karren Burly, MD 01/17/2023

## 2023-01-17 NOTE — Patient Instructions (Addendum)
Faxed Trelegy [employer] paperwork on January 25,2024. Please call [employer] at [phone] to get an update on your application from January.     Try to see of the Trelegy would be a better cost the same way you were able to get he Eliquis for a better cost  Take prednisone  40 mg once a day for 5 days then 20 mg once a day for 5 days

## 2023-01-28 ENCOUNTER — Encounter: Payer: Self-pay | Admitting: Pulmonary Disease

## 2023-01-28 ENCOUNTER — Emergency Department: Payer: Medicare Other

## 2023-01-28 ENCOUNTER — Emergency Department (HOSPITAL_BASED_OUTPATIENT_CLINIC_OR_DEPARTMENT_OTHER): Payer: Medicare Other

## 2023-01-28 ENCOUNTER — Emergency Department (HOSPITAL_BASED_OUTPATIENT_CLINIC_OR_DEPARTMENT_OTHER)
Admission: EM | Admit: 2023-01-28 | Discharge: 2023-01-28 | Disposition: A | Discharge status: Home / Self Care | Payer: Medicare Other | Attending: Emergency Medicine | Admitting: Emergency Medicine

## 2023-01-28 ENCOUNTER — Encounter (HOSPITAL_BASED_OUTPATIENT_CLINIC_OR_DEPARTMENT_OTHER): Payer: Self-pay | Admitting: Urology

## 2023-01-28 ENCOUNTER — Other Ambulatory Visit: Payer: Self-pay

## 2023-01-28 DIAGNOSIS — R079 Chest pain, unspecified: Secondary | ICD-10-CM | POA: Diagnosis present

## 2023-01-28 DIAGNOSIS — R002 Palpitations: Secondary | ICD-10-CM | POA: Diagnosis not present

## 2023-01-28 DIAGNOSIS — R519 Headache, unspecified: Secondary | ICD-10-CM | POA: Insufficient documentation

## 2023-01-28 DIAGNOSIS — R0602 Shortness of breath: Secondary | ICD-10-CM

## 2023-01-28 DIAGNOSIS — J189 Pneumonia, unspecified organism: Secondary | ICD-10-CM | POA: Insufficient documentation

## 2023-01-28 LAB — BASIC METABOLIC PANEL
Anion gap: 9 (ref 5–15)
BUN: 11 mg/dL (ref 6–20)
CO2: 25 mmol/L (ref 22–32)
Calcium: 9.3 mg/dL (ref 8.9–10.3)
Chloride: 103 mmol/L (ref 98–111)
Creatinine, Ser: 0.89 mg/dL (ref 0.44–1.00)
GFR, Estimated: >60 mL/min (ref >60)
Glucose, Bld: 92 mg/dL (ref 70–99)
Potassium: 3.7 mmol/L (ref 3.5–5.1)
Sodium: 137 mmol/L (ref 135–145)

## 2023-01-28 LAB — CBC
HCT: 34.7 % — ABNORMAL LOW (ref 36.0–46.0)
Hemoglobin: 11.1 g/dL — ABNORMAL LOW (ref 12.0–15.0)
MCH: 25.8 pg — ABNORMAL LOW (ref 26.0–34.0)
MCHC: 32 g/dL (ref 30.0–36.0)
MCV: 80.5 fL (ref 80.0–100.0)
Platelets: 323 10*3/uL (ref 150–400)
RBC: 4.31 MIL/uL (ref 3.87–5.11)
RDW: 15.8 % — ABNORMAL HIGH (ref 11.5–15.5)
WBC: 5 10*3/uL (ref 4.0–10.5)
nRBC: 0 % (ref 0.0–0.2)

## 2023-01-28 LAB — TROPONIN I (HIGH SENSITIVITY)
Troponin I (High Sensitivity): 3 ng/L (ref <18)
Troponin I (High Sensitivity): 4 ng/L (ref <18)

## 2023-01-28 LAB — MAGNESIUM: Magnesium: 2.3 mg/dL (ref 1.7–2.4)

## 2023-01-28 MED ORDER — HYDRALAZINE HCL 10 MG PO TABS
10.0000 mg | ORAL_TABLET | Freq: Once | ORAL | Status: AC
  Administered 2023-01-28: 10 mg via ORAL
  Filled 2023-01-28: qty 1

## 2023-01-28 MED ORDER — IOHEXOL 350 MG/ML SOLN
75.0000 mL | Freq: Once | INTRAVENOUS | Status: AC | PRN
  Administered 2023-01-28: 75 mL via INTRAVENOUS

## 2023-01-28 MED ORDER — CEFDINIR 300 MG PO CAPS: 300.0000 mg | ORAL_CAPSULE | Freq: Two times a day (BID) | ORAL | 0 refills | Status: AC

## 2023-01-28 MED ORDER — CEFDINIR 300 MG PO CAPS
300.0000 mg | ORAL_CAPSULE | Freq: Two times a day (BID) | ORAL | Status: DC
  Administered 2023-01-28: 300 mg via ORAL
  Filled 2023-01-28: qty 1

## 2023-01-28 MED ORDER — AZITHROMYCIN 250 MG PO TABS
500.0000 mg | ORAL_TABLET | Freq: Once | ORAL | Status: AC
  Administered 2023-01-28: 500 mg via ORAL
  Filled 2023-01-28: qty 2

## 2023-01-28 MED ORDER — AZITHROMYCIN 250 MG PO TABS: 250.0000 mg | ORAL_TABLET | Freq: Every day | ORAL | 0 refills | Status: AC

## 2023-01-28 NOTE — Discharge Instructions (Signed)
Your history, exam and workup today revealed pneumonia causing your likely symptoms.  There is no evidence of blood clot.  I spoke with pharmacy who recommended both Omnicef and azithromycin.  He received a dose of both of them tonight.  As you received the first dose of the azithromycin, please take 1 tablet for the next 4 days starting tomorrow.  The Omnicef will last for 10 days.  Please rest and stay hydrated and follow-up with your regular doctor.  If any symptoms change or worsen acutely, please return to the nearest emergency department.

## 2023-01-28 NOTE — ED Notes (Addendum)
Pt up to [facility]

## 2023-01-28 NOTE — ED Provider Notes (Signed)
[facility] Provider Note   CSN: [id number] Arrival date & time: 01/28/23  1330     History  Chief Complaint  Patient presents with   Chest Pain    Pamela Potter is a 57 y.o. female.  The history is provided by the patient and medical records. No language interpreter was used.  Chest Pain Pain location:  Substernal area and L chest Pain quality: aching, pressure and radiating   Pain quality comment:  Palpitations Pain radiates to:  L shoulder Pain severity:  Moderate Onset quality:  Gradual Duration:  2 days Timing:  Intermittent Progression:  Waxing and waning Chronicity:  Recurrent Relieved by:  Nothing Worsened by:  Deep breathing Ineffective treatments:  None tried Associated symptoms: altered mental status (head tingling sensation and lightheaded), cough, headache (mild discomfort and head tingling) and palpitations   Associated symptoms: no abdominal pain, no back pain, no diaphoresis, no dizziness, no fatigue, no fever, no lower extremity edema, no nausea, no near-syncope, no numbness, no shortness of breath, no syncope, no vomiting and no weakness   Risk factors: prior DVT/PE        Home Medications Prior to Admission medications   Medication Sig Start Date End Date Taking? Authorizing Provider  albuterol (VENTOLIN HFA) 108 (90 Base) MCG/ACT inhaler Inhale 2 puffs into the lungs every 4 (four) hours as needed for wheezing or shortness of breath. 06/15/22   Hunsucker, Lesia Sago, MD  apixaban (ELIQUIS) 5 MG TABS tablet Take 1 tablet (5 mg total) by mouth 2 (two) times daily. 11/21/22   Hunsucker, Lesia Sago, MD  carvedilol (COREG) 6.25 MG tablet Take 6.25 mg by mouth 2 (two) times daily with a meal.    [provider]  cetirizine (ZYRTEC) 10 MG tablet Take 1 tablet (10 mg total) by mouth daily. 11/12/22   Gustavus Bryant, FNP  Fluticasone-Umeclidin-Vilant (TRELEGY ELLIPTA) 200-62.5-25 MCG/ACT AEPB Inhale 1 puff into  the lungs daily. 10/25/22   Hunsucker, Lesia Sago, MD  furosemide (LASIX) 20 MG tablet Take 20 mg by mouth 2 (two) times daily. 06/21/22   [provider]  levothyroxine (SYNTHROID, LEVOTHROID) 50 MCG tablet Take 50 mcg by mouth daily before breakfast.    [provider]  ocrelizumab 600 mg in sodium chloride 0.9 % 500 mL Inject 600 mg into the vein every 6 (six) months.    [provider]  Saline (SIMPLY SALINE) 0.9 % AERS Place 1 spray into the nose as needed (congestion).    [provider]      Allergies    Ace inhibitors, Sulfa antibiotics, Triamterene, Amoxicillin, Benadryl [diphenhydramine], Dyazide [hydrochlorothiazide w-triamterene], Lisinopril, Norvasc [amlodipine besylate], and Dilantin [phenytoin sodium extended]    Review of Systems   Review of Systems  Constitutional:  Negative for chills, diaphoresis, fatigue and fever.  HENT:  Negative for congestion.   Eyes:  Negative for visual disturbance.  Respiratory:  Positive for cough. Negative for chest tightness, shortness of breath, wheezing and stridor.   Cardiovascular:  Positive for chest pain and palpitations. Negative for leg swelling, syncope and near-syncope.  Gastrointestinal:  Negative for abdominal pain, constipation, diarrhea, nausea and vomiting.  Genitourinary:  Negative for flank pain.  Musculoskeletal:  Negative for back pain, neck pain and neck stiffness.  Skin:  Negative for rash and wound.  Neurological:  Positive for light-headedness and headaches (mild discomfort and head tingling). Negative for dizziness, syncope, facial asymmetry, weakness and numbness.  Psychiatric/Behavioral:  Negative  for agitation.   All other systems reviewed and are negative.   Physical Exam Updated Vital Signs BP (!) 181/97 (BP Location: Right Arm)   Pulse 64   Temp 97.8 F (36.6 C) (Oral)   Resp 18   Ht 5\' 3"  (1.6 m)   Wt 69.4 kg   LMP 01/01/2017 (Exact Date)   SpO2 100%   BMI 27.10 kg/m   Physical Exam Vitals and nursing note reviewed.  Constitutional:      General: She is not in acute distress.    Appearance: She is well-developed. She is not ill-appearing, toxic-appearing or diaphoretic.  HENT:     Head: Normocephalic and atraumatic.  Eyes:     Extraocular Movements: Extraocular movements intact.     Conjunctiva/sclera: Conjunctivae normal.     Pupils: Pupils are equal, round, and reactive to light.  Cardiovascular:     Rate and Rhythm: Normal rate and regular rhythm.     Heart sounds: Normal heart sounds. No murmur heard. Pulmonary:     Effort: Pulmonary effort is normal. No respiratory distress.     Breath sounds: Rhonchi present. No wheezing or rales.  Chest:     Chest wall: No tenderness.  Abdominal:     Palpations: Abdomen is soft.     Tenderness: There is no abdominal tenderness.  Musculoskeletal:        General: No swelling.     Cervical back: Neck supple.     Right lower leg: No tenderness. No edema.     Left lower leg: No tenderness. No edema.  Skin:    General: Skin is warm and dry.     Capillary Refill: Capillary refill takes less than 2 seconds.     Findings: No erythema.  Neurological:     General: No focal deficit present.     Mental Status: She is alert.     Cranial Nerves: No cranial nerve deficit.  Psychiatric:        Mood and Affect: Mood normal.    ED Results / Procedures / Treatments   Labs (all labs ordered are listed, but only abnormal results are displayed) Labs Reviewed  CBC - Abnormal; Notable for the following components:      Result Value   Hemoglobin 11.1 (*)    HCT 34.7 (*)    MCH 25.8 (*)    RDW 15.8 (*)    All other components within normal limits  BASIC METABOLIC PANEL  MAGNESIUM  TSH  TROPONIN I (HIGH SENSITIVITY)  TROPONIN I (HIGH SENSITIVITY)    EKG EKG Interpretation  Date/Time:  Monday January 28 2023 13:43:09 EDT Ventricular Rate:  66 PR Interval:  118 QRS Duration: 72 QT Interval:  391 QTC  Calculation: 410 R Axis:   52 Text Interpretation: Sinus rhythm Borderline short PR interval LVH by voltage Borderline T abnormalities, diffuse leads Baseline wander in lead(s) V5 when compaerd to prior, more pronounced S1Q3T3 pattern. no STEMI Confirmed by Theda Belfast ([id number]) on 01/28/2023 3:12:04 PM  Radiology CT Angio Chest PE W and/or Wo Contrast  Result Date: 01/28/2023 CLINICAL DATA:  Pulmonary embolus suspected EXAM: CT ANGIOGRAPHY CHEST WITH CONTRAST TECHNIQUE: Multidetector CT imaging of the chest was performed using the standard protocol during bolus administration of intravenous contrast. Multiplanar CT image reconstructions and MIPs were obtained to evaluate the vascular anatomy. RADIATION DOSE REDUCTION: This exam was performed according to the departmental dose-optimization program which includes automated exposure control, adjustment of the mA and/or kV according to patient size  and/or use of iterative reconstruction technique. CONTRAST:  75mL OMNIPAQUE IOHEXOL 350 MG/ML SOLN COMPARISON:  Chest CT dated October 17, 2022 FINDINGS: Cardiovascular: No evidence of pulmonary embolus. Normal heart size. No pericardial effusion. Normal caliber thoracic aorta with no atherosclerotic disease. No coronary artery calcifications. Mediastinum/Nodes: Esophagus unremarkable. No enlarged lymph nodes seen in the chest. Lungs/Pleura: Central airways are patent. Mild ground-glass opacities of the right upper lobe. Elevation of the right hemidiaphragm associated atelectasis. Upper Abdomen: No acute abnormality. Musculoskeletal: No chest wall abnormality. No acute or significant osseous findings. Review of the MIP images confirms the above findings. IMPRESSION: 1. No evidence of pulmonary embolus. 2. New mild ground-glass opacities of the right upper lobe, likely due to an infectious or inflammatory process. Electronically Signed   By: Allegra Lai M.D.   On: 01/28/2023 18:17   CT Head Wo Contrast  Result  Date: 01/28/2023 CLINICAL DATA:  Lightheaded EXAM: CT HEAD WITHOUT CONTRAST TECHNIQUE: Contiguous axial images were obtained from the base of the skull through the vertex without intravenous contrast. RADIATION DOSE REDUCTION: This exam was performed according to the departmental dose-optimization program which includes automated exposure control, adjustment of the mA and/or kV according to patient size and/or use of iterative reconstruction technique. COMPARISON:  No prior CT head available, correlation is made with 07/12/2022 MRI head FINDINGS: Brain: No evidence of acute infarction, hemorrhage, mass, mass effect, or midline shift. No hydrocephalus or extra-axial fluid collection. Periventricular white matter changes, some of which may be related to the patient's diagnosis multiple sclerosis. Vascular: No hyperdense vessel. Skull: Negative for fracture or focal lesion. Hyperostosis frontalis. Sinuses/Orbits: L4 mucosal thickening in the maxillary sinuses. No acute finding in the orbits. Other: The mastoid air cells are well aerated. IMPRESSION: No acute intracranial process. Electronically Signed   By: Wiliam Ke M.D.   On: 01/28/2023 18:11   DG Chest 2 View  Result Date: 01/28/2023 CLINICAL DATA:  Increasing chest pain since starting steroid therapy yesterday. On blood thinners for pulmonary embolism. EXAM: CHEST - 2 VIEW COMPARISON:  Radiographs 09/10/2022 and 09/06/2022.  CT 10/17/2022. FINDINGS: The heart size and mediastinal contours are stable. There are lower lung volumes with resulting increased patchy bibasilar pulmonary opacities, likely reflecting atelectasis. No confluent airspace disease, pneumothorax or significant pleural effusion demonstrated. The bones appear unchanged. IMPRESSION: Lower lung volumes with resulting patchy bibasilar pulmonary opacities, likely reflecting atelectasis. No confluent airspace disease. Electronically Signed   By: Carey Bullocks M.D.   On: 01/28/2023 14:10     Procedures Procedures    Medications Ordered in ED Medications  cefdinir (OMNICEF) capsule 300 mg (300 mg Oral Given 01/28/23 2116)  iohexol (OMNIPAQUE) 350 MG/ML injection 75 mL (75 mLs Intravenous Contrast Given 01/28/23 1741)  hydrALAZINE (APRESOLINE) tablet 10 mg (10 mg Oral Given 01/28/23 2023)  azithromycin (ZITHROMAX) tablet 500 mg (500 mg Oral Given 01/28/23 2116)    ED Course/ Medical Decision Making/ A&P                             Medical Decision Making Amount and/or Complexity of Data Reviewed Labs: ordered. Radiology: ordered.  Risk Prescription drug management.    RAYMONDE HAMBLIN is a 57 y.o. female with a past medical history significant for MS, hypertension, hypothyroidism, asthma, vertigo, and new pulmonary embolism 4 months ago who presents with several weeks of continued cough and congestion and was started on prednisone yesterday and is now having chest pain  going to left shoulder and palpitations.  According to patient, she has been on Eliquis since her blood clots were discovered in her lungs several months ago but she has continued to travel to and from [facility].  She denies any leg pain or leg swelling but reports a family of her just passed away from pulmonary emboli.  She reports that she has had some URI symptoms with productive cough for several weeks and was started on prednisone for this.  She took her first dose of prednisone yesterday and since then has been feeling palpitations and chest pain.  She reports that it comes and goes and does seem to be somewhat pleuritic.  She denies any nausea vomiting or shortness of breath at this time.  She denies any new fevers or chills.  Denies any constipation, diarrhea, or urinary changes.  She does report that since her blood pressure has been up since taking the prednisone, she has also had some tingling in her head and mild discomfort.  She also feels somewhat lightheaded.  EKG shows S1Q3T3  pattern with no STEMI.  Is more pronounced than previous.  On exam, lungs had some slight coarseness but chest was nontender.  There was no murmur on my exam.  Abdomen nontender.  No lower extremity tenderness or edema and she had good pulses in all extremities.  No focal neurologic deficits.  Pupils are symmetric and reactive with normal extract movements.  Clear speech.  Intact sensation, strength, and pulses in extremities.  Clinically I suspect that taking the prednisone yesterday caused her to have palpitations and may have contributed some of her discomfort in her chest.  Also may have caused her blood pressure to rise leading to the lightheadedness and head tingling sensation.  We will get head CT to rule intracranial bleed and also get CT PE study given my concern for possible recurrent or increased clot burden in the chest with her continued travel.  Her EKG is concerning for more of a heart strain pattern related to previous PE.  Will get screening labs including delta troponin as well as TSH and mag level given the palpitations.  Anticipate reassessment after workup to determine disposition.  Patient had workup that revealed evidence of a right-sided pneumonia but there is no evidence of blood clot on CT scan.  Her troponin was negative as was her metabolic panel.  Magnesium normal.  CT head showed no acute abnormality.  Spoke to pharmacy given her allergy intolerance and her report that she recently completed doxycycline for urine troubles and they recommended Omnicef and azithromycin.  Patient was given a dose tonight here of them and was given prescriptions.  Patient will follow-up with PCP and understood return precautions.    Vital signs did not show hypoxia and patient is feeling better would like to go home.  Patient given prescriptions and was discharged in stable condition understanding return precautions and follow-up instructions.         Final Clinical Impression(s) / ED  Diagnoses Final diagnoses:  Pneumonia of right upper lobe due to infectious organism  Chest pain, unspecified type  Shortness of breath    Rx / DC Orders ED Discharge Orders          Ordered    cefdinir (OMNICEF) 300 MG capsule  2 times daily        01/28/23 2104    azithromycin (ZITHROMAX) 250 MG tablet  Daily        01/28/23 2104  Clinical Impression: 1. Pneumonia of right upper lobe due to infectious organism   2. Chest pain, unspecified type   3. Shortness of breath     Disposition: Discharge  Condition: Good  I have discussed the results, Dx and Tx plan with the pt(& family if present). He/she/they expressed understanding and agree(s) with the plan. Discharge instructions discussed at great length. Strict return precautions discussed and pt &/or family have verbalized understanding of the instructions. No further questions at time of discharge.    Discharge Medication List as of 01/28/2023  9:05 PM     START taking these medications   Details  azithromycin (ZITHROMAX) 250 MG tablet Take 1 tablet (250 mg total) by mouth daily for 4 days. Please take 1 every day until finished., Starting Mon 01/28/2023, Until Fri 02/01/2023, Print    cefdinir (OMNICEF) 300 MG capsule Take 1 capsule (300 mg total) by mouth 2 (two) times daily for 10 days., Starting Mon 01/28/2023, Until Thu 02/07/2023, Print        Follow Up: Johny Blamer, MD [address] [place] [address] [phone]     [facility] Emergency Department at [facility] [address] [id number] YQ [facility] [place] [address] [phone]        Krithi Bray, Canary Brim, MD 01/28/23 2145

## 2023-01-28 NOTE — Telephone Encounter (Signed)
Received the following message from patient:   "Good morning,  Pamela Potter and Dr. Judeth Horn,    I have several questions about the bilateral blood clots that was discovered in January 2024.  These questions stem from the fact that my family just buried my 58 years old first cousin due to a very quick onset from PE starting in leg.   (1)  Should I be concerned about the current PEs that were located bilaterally since I am on Eliquis.  Will they dissolve on their own?   (2)  Can u still get blood clots even when you are on blood thinning medication?   (3)  Should I be waiting compression socks on an everyday basis?   (4)  What is recommended when traveling more than aa hour?  I will be going to [place] in May, which is approximately 8 to 9 hours.   The last question has to do with the Prednisone that was prescribed at my last visit.  Due to helping with planning the funeral for my cousin, I just took my first two tablets yesterday morning.  The symptoms that I had included chest pains, heart palpitations, my blood pressure increased and feeling of increased swelling. I felt bad for the entire day after taking around 1pm after eating lunch.  Should I continue taking?   Thank you in advance for responding to my concerns. Pamela Potter"  Dr. Judeth Horn, can you please advise? Thanks!

## 2023-01-28 NOTE — ED Triage Notes (Signed)
Pt states took prednisone yesterday for mucus in lungs  And states chest pain that started after taking it States found bilateral PE in January and is taking blood thinners

## 2023-01-29 LAB — TSH: TSH: 0.989 u[IU]/mL (ref 0.350–4.500)

## 2023-01-30 ENCOUNTER — Telehealth (HOSPITAL_BASED_OUTPATIENT_CLINIC_OR_DEPARTMENT_OTHER): Payer: Self-pay | Admitting: Emergency Medicine

## 2023-01-30 MED ORDER — HYDRALAZINE HCL 10 MG PO TABS: 10.0000 mg | ORAL_TABLET | Freq: Two times a day (BID) | ORAL | 0 refills | Status: DC | PRN

## 2023-01-30 NOTE — Telephone Encounter (Signed)
Patient reports that the hydralazine that was meant to be ordered for her did not get sent in.  They tried to call this afternoon but they are most of another issue.  Will call into her pharmacy.

## 2023-02-05 ENCOUNTER — Telehealth: Payer: Self-pay | Admitting: *Deleted

## 2023-02-05 NOTE — Telephone Encounter (Signed)
Pt here to get Ocrevus infusion per Kim,RN. However, pt reported to them that she has had Pneumonia and been on antibiotics for 7 days. I spoke w/ Dr. Epimenio Foot and he stated: She should wait until pneumonia is completely cleared so lets schedule in 1-2 weeks"  Kim,RN will r/s pt infusion

## 2023-02-18 ENCOUNTER — Ambulatory Visit: Payer: Medicare Other | Admitting: Pulmonary Disease

## 2023-02-18 ENCOUNTER — Encounter: Payer: Self-pay | Admitting: Pulmonary Disease

## 2023-02-18 VITALS — BP 110/80 | HR 80 | Ht 63.0 in | Wt 156.8 lb

## 2023-02-18 DIAGNOSIS — J4540 Moderate persistent asthma, uncomplicated: Secondary | ICD-10-CM

## 2023-02-18 MED ORDER — AEROCHAMBER MV MISC: 0 refills | Status: DC

## 2023-02-18 MED ORDER — ALBUTEROL SULFATE HFA 108 (90 BASE) MCG/ACT IN AERS: 2.0000 | INHALATION_SPRAY | RESPIRATORY_TRACT | 11 refills | Status: DC | PRN

## 2023-02-18 MED ORDER — TRELEGY ELLIPTA 100-62.5-25 MCG/ACT IN AEPB: 1.0000 | INHALATION_SPRAY | Freq: Every day | RESPIRATORY_TRACT | 0 refills | Status: DC

## 2023-02-18 MED ORDER — TRELEGY ELLIPTA 100-62.5-25 MCG/ACT IN AEPB
1.0000 | INHALATION_SPRAY | Freq: Every day | RESPIRATORY_TRACT | 11 refills | Status: AC
Start: 2023-02-18 — End: ?

## 2023-02-18 NOTE — Patient Instructions (Signed)
It is nice to see you again  I am glad you are feeling a bit better  Over time your CT scan to look much much clearer.  I think this is very encouraging.  The area of pneumonia on the CT scan last month is very faint and very small.  Lets decrease the dose of Trelegy to the lower dose, use 1 puff once a day as you have been.  I sent a prescription to see how much it would cost with your husband's plans as well as a co-pay card to help cover additional cost.  I sent a refill for the albuterol, take it with you to [place].  Use 2 puffs as needed with a spacer.  Return to clinic in 3 months or sooner as needed with Dr. Judeth Horn

## 2023-02-18 NOTE — Progress Notes (Signed)
@Patient  ID: Pamela Potter, female    DOB: 11-30-1965, 57 y.o.   MRN: [id number]  Chief Complaint  Patient presents with   Follow-up    F/up long hauler covid, pneumonia, and discuss trelegy 200 MG inhaler.    Referring provider: Johny Blamer, MD  HPI:   57 y.o. whom we are seeing in follow-up for evaluation of dyspnea on exertion and cough since COVID-19 infection 09/2020.  Incidentally discovered pulmonary embolus 10/2022.  At last visit had a lot of upper airway congestion, nasal congestion and runny nose, sinus congestion.  Denies any dyspnea, or chest pain.  Tried steroids for possible allergic sinusitis.  Given everything is happening with the pollens.  She had unacceptable side effects to this.  She started this medication after going to funeral for her cousin.  The next day she felt worse with chest discomfort that was new compared to prior visit with me.  Also with some shortness of breath.  In the ED CTA PE protocol was obtained that showed no PE but showed very faint central groundglass opacities in the right upper lobe review interpretation.  She was diagnosed with pneumonia and sent home on cefdinir and azithromycin.  Revealed serial CT scans dating back to 2022 and then more recently 10/2022 that over time shows gradual improvement in scattered opacities 10/2022 compared to 2022 and then even further improvement on more recent CT scan 01/2023.  Overall encouraging that signs from prior inflammatory joints have improved.  Suspect most recent pneumonia is either involving inflammatory infiltrates and not new or related to new infection she picked up likely with exposure to other people, suspect viral based on location and appearance of infiltrates.  HPI at initial visit Patient contracted COVID 09/2020.  She received antibody infusion.  She had COVID-vaccine x3 prior to infection.  Symptoms were nasal congestion, sore throat.  Mild dyspnea.  Though symptoms gradually improved except  congestion in the nasal passages has persisted.  Dyspnea exertion has worsened.  Dyspnea worse up inclines or stairs.  Can be present on flat surfaces as well.  No timing during day when things are better or worse.  No seasonal or environmental changes make things better or worse.  She did receive prednisone a few days ago and this seemed to help somewhat.  Similarly, no rhyme or reason to her cough.  No relieving or exacerbating factors that she can readily identify.  No timing during day were things are better or worse.  No seasonal or environmental changes.  With the symptoms she went to the ED 11/2020 and had a CTA chest PE protocol performed which on my interpretation reveals no evidence of PE, scattered faint groundglass opacities bilaterally throughout all lobes consistent with atypical pneumonia versus resolving COVID-19 pneumonia.  She was given doxycycline.  Per neurology notes 12/2020 symptoms improved quite a bit with this intervention.  However patient does not recall this and states nothing is really helped.  PMH: Hypertension, MS Surgical history: Reviewed reviewed with patient, she denies surgeries Family history: Reviewed with patient, she denies respiratory illnesses in first-degree relatives Social history: Never smoker, lives with husband, lives in [place] / Pulmonary Flowsheets:   ACT:  Asthma Control Test ACT Total Score  05/08/2022 11:00 AM 17   MMRC:     No data to display           Epworth:      No data to display  Tests:   FENO:  No results found for: "NITRICOXIDE"  PFT:    Latest Ref Rng & Units 02/20/2021   11:47 AM  PFT Results  FVC-Pre L 2.35   FVC-Predicted Pre % 90   FVC-Post L 2.34   FVC-Predicted Post % 90   Pre FEV1/FVC % % 87   Post FEV1/FCV % % 88   FEV1-Pre L 2.03   FEV1-Predicted Pre % 99   FEV1-Post L 2.06   DLCO uncorrected ml/min/mmHg 9.65   DLCO UNC% % 49   DLCO corrected ml/min/mmHg 9.65   DLCO  COR %Predicted % 49   DLVA Predicted % 68   TLC L 3.12   TLC % Predicted % 64   RV % Predicted % 48   Personally reviewed and interpreted as normal spirometry with moderate restriction based on TLC with severely reduced DLCO.  WALK:      No data to display           Imaging: Personally reviewed and as per EMR discussion this note CT high-res 03/2021.  This demonstrated on my interpretation scattered groundglass opacities with mild bronchiectasis scattered throughout concerning for ongoing inflammation from COVID-19 with the presence of bronchiectasis developing fibrosis although there is no frank fibrosis demonstrated on the scan.  Lab Results: Personally reviewed, no significant elevation of eosinophils and no anemia CBC    Component Value Date/Time   WBC 5.0 01/28/2023 1340   RBC 4.31 01/28/2023 1340   HGB 11.1 (L) 01/28/2023 1340   HGB 11.0 (L) 12/20/2022 1532   HCT 34.7 (L) 01/28/2023 1340   HCT 34.8 12/20/2022 1532   PLT 323 01/28/2023 1340   PLT 382 12/20/2022 1532   MCV 80.5 01/28/2023 1340   MCV 80 12/20/2022 1532   MCH 25.8 (L) 01/28/2023 1340   MCHC 32.0 01/28/2023 1340   RDW 15.8 (H) 01/28/2023 1340   RDW 15.8 (H) 12/20/2022 1532   LYMPHSABS 1.1 12/20/2022 1532   EOSABS 0.2 12/20/2022 1532   BASOSABS 0.0 12/20/2022 1532    BMET    Component Value Date/Time   NA 137 01/28/2023 1340   NA 144 06/21/2022 1116   K 3.7 01/28/2023 1340   CL 103 01/28/2023 1340   CO2 25 01/28/2023 1340   GLUCOSE 92 01/28/2023 1340   BUN 11 01/28/2023 1340   BUN 17 06/21/2022 1116   CREATININE 0.89 01/28/2023 1340   CALCIUM 9.3 01/28/2023 1340   GFRNONAA >60 01/28/2023 1340   GFRAA 70 07/11/2020 1343    BNP No results found for: "BNP"  ProBNP No results found for: "PROBNP"  Specialty Problems   None  Allergies  Allergen Reactions   Ace Inhibitors Swelling    lips   Sulfa Antibiotics Hives and Itching   Triamterene Swelling   Amoxicillin Diarrhea   Benadryl  [Diphenhydramine] Other (See Comments)    Difficulty swallowing   Dyazide [Hydrochlorothiazide W-Triamterene] Swelling   Lisinopril Swelling   Norvasc [Amlodipine Besylate] Swelling   Dilantin [Phenytoin Sodium Extended] Hives and Rash    Immunization History  Administered Date(s) Administered   Influenza,inj,Quad PF,6+ Mos 08/13/2018   Influenza-Unspecified 08/08/2014, 07/02/2019, 07/14/2021   Zoster, Live 09/07/2013    Past Medical History:  Diagnosis Date   Anemia    Asthma    Hypertension    Hypothyroidism    Multiple sclerosis (HCC)    Neuromuscular disorder (HCC)    Pre-diabetes    Vertigo    Vision abnormalities     Tobacco History:  Social History   Tobacco Use  Smoking Status Never  Smokeless Tobacco Never   Counseling given: Not Answered   Continue to not smoke  Outpatient Encounter Medications as of 02/18/2023  Medication Sig   apixaban (ELIQUIS) 5 MG TABS tablet Take 1 tablet (5 mg total) by mouth 2 (two) times daily.   carvedilol (COREG) 6.25 MG tablet Take 6.25 mg by mouth 2 (two) times daily with a meal.   cetirizine (ZYRTEC) 10 MG tablet Take 1 tablet (10 mg total) by mouth daily.   Fluticasone-Umeclidin-Vilant (TRELEGY ELLIPTA) 100-62.5-25 MCG/ACT AEPB Inhale 1 puff into the lungs daily.   Fluticasone-Umeclidin-Vilant (TRELEGY ELLIPTA) 100-62.5-25 MCG/ACT AEPB Inhale 1 each into the lungs daily.   furosemide (LASIX) 20 MG tablet Take 20 mg by mouth 2 (two) times daily.   hydrALAZINE (APRESOLINE) 10 MG tablet Take 1 tablet (10 mg total) by mouth 2 (two) times daily as needed (if BP sustained over 160 on your regular meds.).   levothyroxine (SYNTHROID, LEVOTHROID) 50 MCG tablet Take 50 mcg by mouth daily before breakfast.   ocrelizumab 600 mg in sodium chloride 0.9 % 500 mL Inject 600 mg into the vein every 6 (six) months.   Saline (SIMPLY SALINE) 0.9 % AERS Place 1 spray into the nose as needed (congestion).   Spacer/Aero-Holding Chambers (AEROCHAMBER  MV) inhaler Use as instructed   [DISCONTINUED] albuterol (VENTOLIN HFA) 108 (90 Base) MCG/ACT inhaler Inhale 2 puffs into the lungs every 4 (four) hours as needed for wheezing or shortness of breath.   [DISCONTINUED] Fluticasone-Umeclidin-Vilant (TRELEGY ELLIPTA) 200-62.5-25 MCG/ACT AEPB Inhale 1 puff into the lungs daily.   albuterol (VENTOLIN HFA) 108 (90 Base) MCG/ACT inhaler Inhale 2 puffs into the lungs every 4 (four) hours as needed for wheezing or shortness of breath.   No facility-administered encounter medications on file as of 02/18/2023.     Review of Systems  Review of Systems  N/a Physical Exam  BP 110/80 (BP Location: Left Arm)   Pulse 80   Ht 5\' 3"  (1.6 m)   Wt 156 lb 12.8 oz (71.1 kg)   LMP 01/01/2017 (Exact Date)   SpO2 98%   BMI 27.78 kg/m   Wt Readings from Last 5 Encounters:  02/18/23 156 lb 12.8 oz (71.1 kg)  01/28/23 153 lb (69.4 kg)  01/17/23 157 lb (71.2 kg)  12/20/22 156 lb 9.6 oz (71 kg)  10/24/22 156 lb (70.8 kg)    BMI Readings from Last 5 Encounters:  02/18/23 27.78 kg/m  01/28/23 27.10 kg/m  01/17/23 27.81 kg/m  12/20/22 27.74 kg/m  10/24/22 27.63 kg/m     Physical Exam General: Well-appearing, no acute distress Eyes: EOMI, no icterus Neck: Supple no JVP Cardiovascular: Regular rate and rhythm, no murmur Pulmonary: Inspiratory crackles left lower lung field, normal work of breathing Abdomen: Nondistended, bowel sounds present MSK: No synovitis, no joint effusion Neuro: Normal gait, no weakness Psych: Normal mood, full affect   Assessment & Plan:   Presumed asthma: Triggered by COVID-19 infection.  ICS/LABA without much improvement.  De-escalate to low-dose Trelegy given concern for risk factor for pneumonia.  Instructed use albuterol as needed with spacer.  Education given.  New prescription for both today.  Recent pneumonia: Faint groundglass opacities upper lobe centrally.  Suspect this was a viral mediated based on  appearance and location.  Recently attended funeral, likely exposure related.  It is also possible this is just evolving inflammatory infiltrate seen on serial CT scans with gradual improvement and near  resolution over time.  Incidentally discovered pulmonary embolus: Likely provoked in the setting of recent COVID infection as well as knee replacement.  Strong family history as well.  Continue Eliquis, 5 mg twice daily.  Discussed minimal 3-6 months of treatment.  But recommend  lifelong anticoagulation given her family history.     Return in about 3 months (around 05/21/2023) for f/u Dr. Judeth Horn.   Karren Burly, MD 02/18/2023  I spent 40 minutes in the care of the patient including face-to-face visit, review of records, coordination of care.

## 2023-02-22 ENCOUNTER — Ambulatory Visit: Payer: Medicare Other | Admitting: Sports Medicine

## 2023-02-22 ENCOUNTER — Other Ambulatory Visit: Payer: Self-pay

## 2023-02-22 VITALS — BP 142/90 | Ht 63.0 in | Wt 150.0 lb

## 2023-02-22 DIAGNOSIS — M25562 Pain in left knee: Secondary | ICD-10-CM | POA: Diagnosis not present

## 2023-02-22 NOTE — Progress Notes (Unsigned)
Subjective:    Patient ID: Pamela Potter, female    DOB: 08-09-1966, 57 y.o.   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 Leonides Schanz at [facility] orthopedics.  Patient reports that she had a fall roughly 2 weeks ago with varus stress placed on left knee.  Since then she had mild increased edema and pain of her lateral side of left knee.  Per patient report, she saw Dr. Jerl Santos last week who performed x-rays showing good alignment and stability of artificial knee joint.  Patient denies feeling any instability.  Patient endorses pain to palpation over lateral joint line extending distally down to mid calf.  Denies any radiation of pain proximally.  Her main concern is that in addition to some of the aching pain/stiffness she feels increased "popping like rice crispies" upon standing from seated position in chair.  Reports the crepitus was not present prior to fall.  Patient plans on traveling in the coming weeks and wants to ensure safety with ambulation prior to leaving.  Review of Systems Negative except as noted above    Objective:   Physical Exam Constitutional:      General: She is not in acute distress.    Appearance: Normal appearance.  Musculoskeletal:        General: Swelling and tenderness present. No deformity. Normal range of motion.     Right lower leg: No edema.     Left lower leg: No edema.     Comments: On examination patient has mild edema surrounding her left knee.  No erythema, or significant ecchymosis on exam.  Pain with palpation over lateral joint line and some radiation of tenderness down proximal left lower leg.  Describes primarily as stiffness.  Still has full range of motion in left knee.  Strength intact.  Stability with varus and valgus stress testing.  No indications of neurovascular compromise.  Does have significant crepitus upon standing  from seated position.  I suspect crepitus is due to mobilization of intra-articular edema upon quadriceps contraction.  Denies crepitus causing increased pain.  Skin:    General: Skin is warm and dry.     Capillary Refill: Capillary refill takes less than 2 seconds.     Findings: No erythema, lesion or rash.  Neurological:     General: No focal deficit present.     Mental Status: She is alert. Mental status is at baseline.     Sensory: No sensory deficit.     Motor: No weakness.     Coordination: Coordination normal.     Gait: Gait normal.  Psychiatric:        Mood and Affect: Mood normal.        Behavior: Behavior normal.        Thought Content: Thought content normal.   Limited ultrasound: Left knee Hypoechoic fluid collection in the suprapatellar pouch seen in both the horizontal and transverse plane. Medial and lateral joint line visualized on ultrasound.  No obvious gross deformity. Patella tendon and quad tendon appear intact. Impression, knee effusion     Assessment & Plan:   1.  Acute left knee pain s/p fall 2 weeks ago, recent history of TKA in October 2023. Patient has some mild intra-articular edema as noted on ultrasound.  See attached images of suprapatellar pouch.  Mild tenderness over lateral joint line. Patient has good  stability of joint on exam Patient instructed to apply Voltaren gel over area of pain/stiffness 4 times daily x 2 weeks Apply ice as needed for pain control and to help reduce further swelling Patient instructed to continue engaging knee and full active range of motion Can wear compression sleeve over the knee for additional subjective stability and to prevent further swelling. Follow-up with Dr. Jerl Santos after returning from vacation to [place] and [place].  Patient agrees with treatment plan as described above.  Will return to clinic or see Dr. Jerl Santos for any additional questions or issues that arise. Havery Moros, DO, PGY 2   I saw and  evaluated this patient separate from the medical student.  I agree with his physical exam, assessment and plan.  Patient had a fall with slight twisting motion to her right knee a couple weeks ago.  She did see Dr. Jerl Santos who did not see any abnormality with the hardware she had placed, TKA in October.  On ultrasound evaluation today she did have small effusion in the left knee.  I recommend she continue with the compression sleeve while she is up and about on her knee.  Also recommend ice and topical Voltaren gel.  Follow-up with Dr. Jerl Santos after her trip. Pamela Potter A.  Leonor Liv, DO PGY 4, [place] [facility] Fellow  I was the preceptor for this visit and available for immediate consultation Pamela Aris, DO

## 2023-05-24 ENCOUNTER — Telehealth: Payer: Self-pay | Admitting: Pulmonary Disease

## 2023-05-24 NOTE — Telephone Encounter (Signed)
Patient is calling stating she is not using her inhaler because it was expensive so she is taking zertec once a day and is doing well with that. She would like to review this with [facility] during her next appointment.

## 2023-06-17 ENCOUNTER — Other Ambulatory Visit: Payer: Self-pay | Admitting: Pulmonary Disease

## 2023-07-03 NOTE — Patient Instructions (Signed)
Below is our plan:  We will continue current treatment plan. I will update labs.   Please make sure you are staying well hydrated. I recommend 50-60 ounces daily. Well balanced diet and regular exercise encouraged. Consistent sleep schedule with 6-8 hours recommended.   Please continue follow up with care team as directed.   Follow up with Dr Epimenio Foot in 6 months   You may receive a survey regarding today's visit. I encourage you to leave honest feed back as I do use this information to improve patient care. Thank you for seeing me today!

## 2023-07-03 NOTE — Progress Notes (Signed)
Chief Complaint  Patient presents with   Follow-up    Pt in room 2. Here for MS follow up. Pt went to Urgent care recently was told she has UTI, has not taking Macrobid yet due to pharmacy not having in stock. Pt will see if Rx is ready for pick up today.    HISTORY OF PRESENT ILLNESS:  07/08/23 ALL:  Pamela Potter is a 57 y.o. female here today for follow up for RRMS. She continues Ocrevus infusions. Last infusion in April, next scheduled in November. Last brain and cervical MRI were stable 07/2022.   She feels that she is fairly stable. She continues to note a pin and needle/tingling sensation from head to toe. Not painful.  No changes in gait. She did have a fall a few weeks ago at her aunt's house. She missed a step and landed on left hip. Twisted left knee. She is followed by ortho. Currently in PT. No weakness.   Mood is good. She denies depression. She goes to bed around 12am, wakes at 6 to let dog out then sleeps until 11am. She feels that once she is able to get up and move around she feels ok. She does not usually need to take naps during the day. .   Bowel/bladder habits are unchanged. She feels urinary frequency is better. She has had some dysuria, recently. She was seen by [facility] 9/26. UA showed trace leukocytes. Neg nitrites. Unclear if they sent culture. Macrobid called in. She wears a pad.   She continues vitamin D supplements, 2000iu, OTC but admits that she is not taking consistently.   HISTORY (copied from Dr Bonnita Hollow previous note)  Pamela Potter is a 57 y.o. woman with relapsing remitting multiple sclerosis.   Update 12/20/2022 She is on Ocrevus. Her last infusion was 08/07/2022.  She feels her MS is doing ok and has no recent exacerbation or new MS symptoms.   She tolerates Ocrevus well.   IgG was low normal at 606 at the last check in November 2023   Gait is stable.   She is a litle off balanced but does not need a cane  She does worse in heat. She needs to hold the  bannister on stairs.    She feels her right leg does worse than her left leg.     She gets some tingling in her feet but this is stable.    She has urinary urgency and occasional incontinence and wears pads.    She had a TI last year.   No hesitancy.      Vision has done well.   She has keratoconus and wears special contacts.   She had OD surgery   She has daily fatigue taht has seemed worse since Covid in late 2021.   She is a little better but then had Covid last Oct/Nov.      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  She often does not fall asleep for a few hours and ten sleeps in late.    She is often hot at night and has trouble getting comfortable.  She twists a lot at night even while asleep.     She never tried the  trazodone .  She denies depression.     She notes verbal fluency ans short term memory issues are worse.     She has right hip pain and left knee pain. She had left knee replacement 1o/2023.   She  is on an anticoagulant for PE (she never had a DVT in leg that she was aware of).        MS History: She was diagnosed with MS in 1991 after presenting with gait disturbance, weakness and vertigo. An MRI of the brain showed plaques and a lumbar puncture were reportedly consistent with MS. She saw Dr. Arville Go a couple years later and was started her on Avonex.   She stayed on Avonex for about 10 years but then had new lesions on her MRI. She was referred to Dr. Leotis Shames. He placed her on Tysabri and she stayed on for a few years. 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 Unfortunately, on Gilenya, she had some MRI progression and Tysabri was restarted. . Of note, her JCV antibody titers were initially less than 0.9. However, over the last couple years her titers have increased and her last JCV antibody was 1.03. She was switched to ocrelizumab and had her first dose the end of June 2017 and her second  dose in mid July. She has not noted any exacerbations while on ocrelizumab.   DATA: MRI images from 02/10/2016 show classic T2/FLAIR hyperintense foci in the periventricular, juxtacortical and deep white matter of both hemispheres and also in the pons and right middle cerebellar peduncle in a pattern and configuration consistent with chronic demyelinating plaque associated with MS.   There were no acute findings and no change when compared to the previous MRI, from 05/17/2014   The MRI of the cervical and thoracic spine 2017 showed foci in the upper cervical spine and in the mid to lower thoracic spine. None of these were acute.       I reviewed her labs. Her JCV antibody on 12/25/2015 was 1.03 (middle positive). She had normal CD4 and CD8 cells.   Hepatitis B labs and Quantiferon TB test were negative or nonreactive.   MRI 02/12/2020 cervical spine:  Patchy T2 hyperintense foci within the upper cervical spine as described above.  None of the foci appear to be acute and there are no definite new lesions compared to the previous MRI from 2015.   Small left paramedian disc protrusion at C6-C7, not present on the 2015 MRI, that does not lead to nerve root compression or spinal stenosis   MRI 02/12/2020 Brain:  Multiple T2/FLAIR hyperintense foci in the brainstem, middle cerebellar peduncles and hemispheres in a pattern and configuration consistent with chronic demyelinating plaque associated with multiple sclerosis.  None of the foci appear to be acute.  Compared to the MRI from 01/08/2018, there are no new lesions.   REVIEW OF SYSTEMS: Out of a complete 14 system review of symptoms, the patient complains only of the following symptoms, fatigue, shortness of breath, knee pain, swelling in lower ext, paresthesias and all other reviewed systems are negative.   ALLERGIES: Allergies  Allergen Reactions   Ace Inhibitors Swelling    lips   Sulfa Antibiotics Hives and Itching   Triamterene Swelling    Amoxicillin Diarrhea   Benadryl [Diphenhydramine] Other (See Comments)    Difficulty swallowing   Dyazide [Hydrochlorothiazide W-Triamterene] Swelling   Lisinopril Swelling   Norvasc [Amlodipine Besylate] Swelling   Dilantin [Phenytoin Sodium Extended] Hives and Rash     HOME MEDICATIONS: Outpatient Medications Prior to Visit  Medication Sig Dispense Refill   carvedilol (COREG) 6.25 MG tablet Take 6.25 mg by mouth 2 (two) times daily with a meal.  cetirizine (ZYRTEC) 10 MG tablet Take 1 tablet (10 mg total) by mouth daily. 30 tablet 0   ELIQUIS 5 MG TABS tablet TAKE 1 TABLET BY MOUTH TWICE A DAY 60 tablet 4   furosemide (LASIX) 20 MG tablet Take 20 mg by mouth 2 (two) times daily.     levothyroxine (SYNTHROID, LEVOTHROID) 50 MCG tablet Take 50 mcg by mouth daily before breakfast.     nitrofurantoin, macrocrystal-monohydrate, (MACROBID) 100 MG capsule Take 1 capsule (100 mg total) by mouth 2 (two) times daily for 5 days. 10 capsule 0   ocrelizumab 600 mg in sodium chloride 0.9 % 500 mL Inject 600 mg into the vein every 6 (six) months.     Saline (SIMPLY SALINE) 0.9 % AERS Place 1 spray into the nose as needed (congestion).     valACYclovir (VALTREX) 500 MG tablet Take 1,000 mg by mouth daily.     albuterol (VENTOLIN HFA) 108 (90 Base) MCG/ACT inhaler Inhale 2 puffs into the lungs every 4 (four) hours as needed for wheezing or shortness of breath. 1 each 11   Fluticasone-Umeclidin-Vilant (TRELEGY ELLIPTA) 100-62.5-25 MCG/ACT AEPB Inhale 1 puff into the lungs daily. (Patient not taking: Reported on 07/08/2023) 1 each 11   Fluticasone-Umeclidin-Vilant (TRELEGY ELLIPTA) 100-62.5-25 MCG/ACT AEPB Inhale 1 each into the lungs daily. 1 each 0   hydrALAZINE (APRESOLINE) 10 MG tablet Take 1 tablet (10 mg total) by mouth 2 (two) times daily as needed (if BP sustained over 160 on your regular meds.). 28 tablet 0   Spacer/Aero-Holding Chambers (AEROCHAMBER MV) inhaler Use as instructed 1 each 0   No  facility-administered medications prior to visit.     PAST MEDICAL HISTORY: Past Medical History:  Diagnosis Date   Anemia    Asthma    Hypertension    Hypothyroidism    Multiple sclerosis (HCC)    Neuromuscular disorder (HCC)    Pre-diabetes    Vertigo    Vision abnormalities      PAST SURGICAL HISTORY: Past Surgical History:  Procedure Laterality Date   COLONOSCOPY  2023   EYE SURGERY  2017   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   Procedure: LEFT TOTAL KNEE ARTHROPLASTY;  Surgeon: Marcene Corning, MD;  Location: [facility];  Service: Orthopedics;  Laterality: Left;     FAMILY HISTORY: Family History  Problem Relation Age of Onset   Hyperlipidemia Mother    Hypertension Mother    Stroke Father    Diabetes Father      SOCIAL HISTORY: Social History   Socioeconomic History   Marital status: Married    Spouse name: Not on file   Number of children: Not on file   Years of education: Not on file   Highest education level: Not on file  Occupational History   Not on file  Tobacco Use   Smoking status: Never   Smokeless tobacco: Never  Vaping Use   Vaping status: Never Used  Substance and Sexual Activity   Alcohol use: Never    Alcohol/week: 0.0 standard drinks of alcohol   Drug use: Never   Sexual activity: Not on file  Other Topics Concern   Not on file  Social History Narrative   Not on file   Social Determinants of Health   Financial Resource Strain: Not on file  Food Insecurity: Not on file  Transportation Needs: Not on file  Physical Activity: Not on file  Stress: Not on file  Social Connections: Not on file  Intimate Partner Violence:  Not on file     PHYSICAL EXAM  Vitals:   07/08/23 1041  BP: 127/79  Pulse: 81  Weight: 162 lb 8 oz (73.7 kg)  Height: 5\' 3"  (1.6 m)     Body mass index is 28.79 kg/m.   Generalized: Well developed, in no acute distress  Cardiology: normal rate and rhythm, no murmur auscultated  Respiratory:  clear to auscultation bilaterally    Neurological examination  Mentation: Alert oriented to time, place, history taking. Follows all commands speech and language fluent Cranial nerve II-XII: Pupils were equal round reactive to light. Extraocular movements were full, visual field were full on confrontational test. Facial sensation and strength were normal. Head turning and shoulder shrug  were normal and symmetric. Motor: The motor testing reveals 5 over 5 strength of all 4 extremities with exception of 4/5 right foot.  Sensory: Sensory testing is intact to soft touch on all 4 extremities. No evidence of extinction is noted.  Gait and station: Station normal. Gait is slightly wide. Tandem is unsteady.    DIAGNOSTIC DATA (LABS, IMAGING, TESTING) - I reviewed patient records, labs, notes, testing and imaging myself where available.  Lab Results  Component Value Date   WBC 5.0 01/28/2023   HGB 11.1 (L) 01/28/2023   HCT 34.7 (L) 01/28/2023   MCV 80.5 01/28/2023   PLT 323 01/28/2023      Component Value Date/Time   NA 137 01/28/2023 1340   NA 144 06/21/2022 1116   K 3.7 01/28/2023 1340   CL 103 01/28/2023 1340   CO2 25 01/28/2023 1340   GLUCOSE 92 01/28/2023 1340   BUN 11 01/28/2023 1340   BUN 17 06/21/2022 1116   CREATININE 0.89 01/28/2023 1340   CALCIUM 9.3 01/28/2023 1340   PROT 5.8 (L) 06/21/2022 1116   ALBUMIN 4.0 06/21/2022 1116   AST 14 06/21/2022 1116   ALT 11 06/21/2022 1116   ALKPHOS 146 (H) 06/21/2022 1116   BILITOT 0.3 06/21/2022 1116   GFRNONAA >60 01/28/2023 1340   GFRAA 70 07/11/2020 1343   No results found for: "CHOL", "HDL", "LDLCALC", "LDLDIRECT", "TRIG", "CHOLHDL" Lab Results  Component Value Date   HGBA1C 5.8 (H) 07/11/2020   No results found for: "VITAMINB12" Lab Results  Component Value Date   TSH 0.989 01/28/2023        No data to display               No data to display           ASSESSMENT AND PLAN  57 y.o. year old female  has  a past medical history of Anemia, Asthma, Hypertension, Hypothyroidism, Multiple sclerosis (HCC), Neuromuscular disorder (HCC), Pre-diabetes, Vertigo, and Vision abnormalities. here with    Relapsing remitting multiple sclerosis (HCC) - Plan: CBC with Differential/Platelets, IgG, IgA, IgM  High risk medication use  Insomnia, unspecified type  Other fatigue  Urge incontinence of urine  Gait disturbance  Vitamin D deficiency - Plan: Vitamin D, 25-hydroxy  Paresthesias  Kanai feels that MS symptoms are stable, overall. She will continue Ocrevus infusions ever 6 months. We will update labs, today. I will place orders for repeat MRI brain and cervical spine for monitoring. We have discussed concerns of swelling, weight gain and paresthesias. Neuro exam intact, however mild non pitting edema noted of bilateral ankles. I have encouraged her to follow up closely with PCP. Consider compression stockings and increasing activity. Adequate hydration and limiting sodium encouraged. She will seek urgent medical attention for  any chest pain or worsening SHOB. She will continue focusing on healthy lifestyle habits.  Regular physical and mental activity advised. She will follow up with Dr Epimenio Foot in 6 months.    Orders Placed This Encounter  Procedures   CBC with Differential/Platelets   IgG, IgA, IgM   Vitamin D, 25-hydroxy      No orders of the defined types were placed in this encounter.    Pamela Dapper, MSN, FNP-C 07/08/2023, 11:28 AM  [facility] [address], [address] [place], [place] [address] [phone]

## 2023-07-04 ENCOUNTER — Other Ambulatory Visit: Payer: Self-pay

## 2023-07-04 ENCOUNTER — Ambulatory Visit
Admission: RE | Admit: 2023-07-04 | Discharge: 2023-07-04 | Disposition: A | Discharge status: Home / Self Care | Payer: Medicare Other | Source: Ambulatory Visit | Attending: Physician Assistant | Admitting: Physician Assistant

## 2023-07-04 VITALS — BP 155/89 | HR 68 | Temp 97.8°F | Resp 18

## 2023-07-04 DIAGNOSIS — N390 Urinary tract infection, site not specified: Secondary | ICD-10-CM

## 2023-07-04 LAB — POCT URINALYSIS DIP (MANUAL ENTRY)
Bilirubin, UA: NEGATIVE
Blood, UA: NEGATIVE
Glucose, UA: NEGATIVE mg/dL
Ketones, POC UA: NEGATIVE mg/dL
Nitrite, UA: NEGATIVE
Protein Ur, POC: NEGATIVE mg/dL
Spec Grav, UA: 1.02 (ref 1.010–1.025)
Urobilinogen, UA: 1 E.U./dL
pH, UA: 6 (ref 5.0–8.0)

## 2023-07-04 MED ORDER — NITROFURANTOIN MONOHYD MACRO 100 MG PO CAPS: 100.0000 mg | ORAL_CAPSULE | Freq: Two times a day (BID) | ORAL | 0 refills | Status: AC

## 2023-07-04 NOTE — ED Triage Notes (Signed)
Pt here for urinary frequency and dysuria x 3 days

## 2023-07-04 NOTE — ED Provider Notes (Signed)
[name]    CSN: [id number] Arrival date & time: 07/04/23  0849      History   Chief Complaint Chief Complaint  Patient presents with   Dysuria    HPI Pamela Potter is a 57 y.o. female.   Pt complains of uti symptoms.  Pt reports frequent uti's in the past.    The history is provided by the patient. No language interpreter was used.  Dysuria Pain quality:  Aching Pain severity:  Mild Onset quality:  Gradual Timing:  Constant Progression:  Worsening   Past Medical History:  Diagnosis Date   Anemia    Asthma    Hypertension    Hypothyroidism    Multiple sclerosis (HCC)    Neuromuscular disorder (HCC)    Pre-diabetes    Vertigo    Vision abnormalities     Patient Active Problem List   Diagnosis Date Noted   Primary osteoarthritis of left knee 07/17/2022   High risk medication use 01/05/2020   Left knee pain 01/05/2020   Atypical facial pain 07/08/2019   Vitamin D deficiency 07/07/2018   Multiple sclerosis (HCC) 10/11/2016   Gait disturbance 10/11/2016   Urge incontinence of urine 10/11/2016   Numbness 10/11/2016   Corneal graft malfunction 11/21/2011   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      Home Medications    Prior to Admission medications   Medication Sig Start Date End Date Taking? Authorizing Provider  nitrofurantoin, macrocrystal-monohydrate, (MACROBID) 100 MG capsule Take 1 capsule (100 mg total) by mouth 2 (two) times daily for 5 days. 07/04/23 07/09/23 Yes Elson Areas, PA-C  albuterol (VENTOLIN HFA) 108 (90 Base) MCG/ACT inhaler Inhale 2 puffs into the lungs every 4 (four) hours as needed for wheezing or shortness  of breath. 02/18/23   Hunsucker, Lesia Sago, MD  carvedilol (COREG) 6.25 MG tablet Take 6.25 mg by mouth 2 (two) times daily with a meal.    [provider]  cetirizine (ZYRTEC) 10 MG tablet Take 1 tablet (10 mg total) by mouth daily. 11/12/22   Mound, Acie Fredrickson, FNP  ELIQUIS 5 MG TABS tablet TAKE 1 TABLET BY MOUTH TWICE A DAY 06/19/23   Hunsucker, Lesia Sago, MD  Fluticasone-Umeclidin-Vilant (TRELEGY ELLIPTA) 100-62.5-25 MCG/ACT AEPB Inhale 1 puff into the lungs daily. 02/18/23   Hunsucker, Lesia Sago, MD  Fluticasone-Umeclidin-Vilant (TRELEGY ELLIPTA) 100-62.5-25 MCG/ACT AEPB Inhale 1 each into the lungs daily. 02/18/23   Hunsucker, Lesia Sago, MD  furosemide (LASIX) 20 MG tablet Take 20 mg by mouth 2 (two) times daily. 06/21/22   [provider]  hydrALAZINE (APRESOLINE) 10 MG tablet Take 1 tablet (10 mg total) by mouth 2 (two) times daily as needed (if BP sustained over 160 on your regular meds.). 01/30/23   Tegeler, Canary Brim, MD  levothyroxine (SYNTHROID, LEVOTHROID) 50 MCG tablet Take 50 mcg by mouth daily before breakfast.    [provider]  ocrelizumab 600 mg in sodium chloride 0.9 % 500 mL Inject 600 mg into the vein every 6 (six) months.    [provider]  Saline (SIMPLY SALINE) 0.9 % AERS Place 1 spray into the  nose as needed (congestion).    [provider]  Spacer/Aero-Holding Chambers (AEROCHAMBER MV) inhaler Use as instructed 02/18/23   Hunsucker, Lesia Sago, MD    Family History Family History  Problem Relation Age of Onset   Hyperlipidemia Mother    Hypertension Mother    Stroke Father    Diabetes Father     Social History Social History   Tobacco Use   Smoking status: Never   Smokeless tobacco: Never  Vaping Use   Vaping status: Never Used  Substance Use Topics   Alcohol use: Never    Alcohol/week: 0.0 standard drinks of alcohol   Drug use: Never     Allergies   Ace inhibitors, Sulfa antibiotics, Triamterene, Amoxicillin,  Benadryl [diphenhydramine], Dyazide [hydrochlorothiazide w-triamterene], Lisinopril, Norvasc [amlodipine besylate], and Dilantin [phenytoin sodium extended]   Review of Systems Review of Systems  Genitourinary:  Positive for dysuria.  All other systems reviewed and are negative.    Physical Exam Triage Vital Signs ED Triage Vitals  Encounter Vitals Group     BP 07/04/23 0918 (!) 155/89     Systolic BP Percentile --      Diastolic BP Percentile --      Pulse Rate 07/04/23 0918 68     Resp 07/04/23 0918 18     Temp 07/04/23 0918 97.8 F (36.6 C)     Temp Source 07/04/23 0918 Oral     SpO2 07/04/23 0918 95 %     Weight --      Height --      Head Circumference --      Peak Flow --      Pain Score 07/04/23 0919 2     Pain Loc --      Pain Education --      Exclude from Growth Chart --    No data found.  Updated Vital Signs BP (!) 155/89 (BP Location: Left Arm)   Pulse 68   Temp 97.8 F (36.6 C) (Oral)   Resp 18   LMP 01/01/2017 (Exact Date)   SpO2 95%   Visual Acuity Right Eye Distance:   Left Eye Distance:   Bilateral Distance:    Right Eye Near:   Left Eye Near:    Bilateral Near:     Physical Exam Vitals and nursing note reviewed.  Constitutional:      General: She is not in acute distress.    Appearance: She is well-developed.  HENT:     Head: Normocephalic and atraumatic.  Eyes:     Conjunctiva/sclera: Conjunctivae normal.  Cardiovascular:     Rate and Rhythm: Normal rate and regular rhythm.     Heart sounds: No murmur heard. Pulmonary:     Effort: Pulmonary effort is normal. No respiratory distress.     Breath sounds: Normal breath sounds.  Abdominal:     Palpations: Abdomen is soft.     Tenderness: There is no abdominal tenderness.  Musculoskeletal:        General: No swelling.  Skin:    General: Skin is warm and dry.     Capillary Refill: Capillary refill takes less than 2 seconds.  Neurological:     Mental Status: She is alert.   Psychiatric:        Mood and Affect: Mood normal.      UC Treatments / Results  Labs (all labs ordered are listed, but only abnormal results are displayed) Labs Reviewed  POCT URINALYSIS DIP (MANUAL ENTRY) - Abnormal; Notable  for the following components:      Result Value   Clarity, UA hazy (*)    Leukocytes, UA Trace (*)    All other components within normal limits    EKG   Radiology No results found.  Procedures Procedures (including critical care time)  Medications Ordered in UC Medications - No data to display  Initial Impression / Assessment and Plan / UC Course  I have reviewed the triage vital signs and the nursing notes.  Pertinent labs & imaging results that were available during my care of the patient were reviewed by me and considered in my medical decision making (see chart for details).      Final Clinical Impressions(s) / UC Diagnoses   Final diagnoses:  Urinary tract infection without hematuria, site unspecified     Discharge Instructions      Return if any problems.    ED Prescriptions     Medication Sig Dispense Auth. Provider   nitrofurantoin, macrocrystal-monohydrate, (MACROBID) 100 MG capsule Take 1 capsule (100 mg total) by mouth 2 (two) times daily for 5 days. 10 capsule Elson Areas, [place]      PDMP not reviewed this encounter. An After Visit Summary was printed and given to the patient.       Elson Areas, [place] 07/04/23 1427

## 2023-07-04 NOTE — Discharge Instructions (Signed)
Return if any problems.

## 2023-07-08 ENCOUNTER — Encounter: Payer: Self-pay | Admitting: Family Medicine

## 2023-07-08 ENCOUNTER — Ambulatory Visit (INDEPENDENT_AMBULATORY_CARE_PROVIDER_SITE_OTHER): Payer: Medicare Other | Admitting: Family Medicine

## 2023-07-08 VITALS — BP 127/79 | HR 81 | Ht 63.0 in | Wt 162.5 lb

## 2023-07-08 DIAGNOSIS — G4700 Insomnia, unspecified: Secondary | ICD-10-CM

## 2023-07-08 DIAGNOSIS — G35 Multiple sclerosis: Secondary | ICD-10-CM

## 2023-07-08 DIAGNOSIS — Z79899 Other long term (current) drug therapy: Secondary | ICD-10-CM

## 2023-07-08 DIAGNOSIS — R202 Paresthesia of skin: Secondary | ICD-10-CM

## 2023-07-08 DIAGNOSIS — E559 Vitamin D deficiency, unspecified: Secondary | ICD-10-CM

## 2023-07-08 DIAGNOSIS — N3941 Urge incontinence: Secondary | ICD-10-CM

## 2023-07-08 DIAGNOSIS — R269 Unspecified abnormalities of gait and mobility: Secondary | ICD-10-CM

## 2023-07-08 DIAGNOSIS — R5383 Other fatigue: Secondary | ICD-10-CM

## 2023-07-09 LAB — IGG, IGA, IGM
IgA/Immunoglobulin A, Serum: 56 mg/dL — ABNORMAL LOW (ref 87–352)
IgG (Immunoglobin G), Serum: 712 mg/dL (ref 586–1602)
IgM (Immunoglobulin M), Srm: 66 mg/dL (ref 26–217)

## 2023-07-09 LAB — CBC WITH DIFFERENTIAL/PLATELET
Basophils Absolute: 0 10*3/uL (ref 0.0–0.2)
Basos: 1 %
EOS (ABSOLUTE): 0.2 10*3/uL (ref 0.0–0.4)
Eos: 5 %
Hematocrit: 42.3 % (ref 34.0–46.6)
Hemoglobin: 13.1 g/dL (ref 11.1–15.9)
Immature Grans (Abs): 0 10*3/uL (ref 0.0–0.1)
Immature Granulocytes: 0 %
Lymphocytes Absolute: 1.4 10*3/uL (ref 0.7–3.1)
Lymphs: 41 %
MCH: 26.4 pg — ABNORMAL LOW (ref 26.6–33.0)
MCHC: 31 g/dL — ABNORMAL LOW (ref 31.5–35.7)
MCV: 85 fL (ref 79–97)
Monocytes Absolute: 0.3 10*3/uL (ref 0.1–0.9)
Monocytes: 9 %
Neutrophils Absolute: 1.5 10*3/uL (ref 1.4–7.0)
Neutrophils: 44 %
Platelets: 288 10*3/uL (ref 150–450)
RBC: 4.96 x10E6/uL (ref 3.77–5.28)
RDW: 16 % — ABNORMAL HIGH (ref 11.7–15.4)
WBC: 3.4 10*3/uL (ref 3.4–10.8)

## 2023-07-09 LAB — VITAMIN D 25 HYDROXY (VIT D DEFICIENCY, FRACTURES): Vit D, 25-Hydroxy: 28.7 ng/mL — ABNORMAL LOW (ref 30.0–100.0)

## 2023-07-17 ENCOUNTER — Ambulatory Visit: Payer: Medicare Other | Admitting: Pulmonary Disease

## 2023-07-17 ENCOUNTER — Encounter: Payer: Self-pay | Admitting: Pulmonary Disease

## 2023-07-17 VITALS — BP 148/86 | HR 83 | Temp 98.4°F | Ht 63.0 in | Wt 162.6 lb

## 2023-07-17 DIAGNOSIS — Z23 Encounter for immunization: Secondary | ICD-10-CM | POA: Diagnosis not present

## 2023-07-17 DIAGNOSIS — I2699 Other pulmonary embolism without acute cor pulmonale: Secondary | ICD-10-CM

## 2023-07-17 DIAGNOSIS — R053 Chronic cough: Secondary | ICD-10-CM

## 2023-07-17 NOTE — Progress Notes (Unsigned)
@Patient  ID: Pamela Potter, female    DOB: 09-Jan-1966, 57 y.o.   MRN: [id number]  Chief Complaint  Patient presents with   Follow-up    Referring provider: Noberto Retort, MD  HPI:   57 y.o. whom we are seeing in follow-up for evaluation of dyspnea on exertion and cough since COVID-19 infection 09/2020.  Incidentally discovered pulmonary embolus 10/2022.  Overall doing well.  Still having access to Trelegy due to cost.  However she is notably Zyrtec things are much better.  We reviewed CT head 01/2023 in the ED which demonstrated some sinusitis.  She thinks this is helping.  I agree.  Agreed to stay off Trelegy.  Continue Eliquis indefinitely for incidentally discovered blood clot, PE, strong family history as well.  HPI at initial visit Patient contracted COVID 09/2020.  She received antibody infusion.  She had COVID-vaccine x3 prior to infection.  Symptoms were nasal congestion, sore throat.  Mild dyspnea.  Though symptoms gradually improved except congestion in the nasal passages has persisted.  Dyspnea exertion has worsened.  Dyspnea worse up inclines or stairs.  Can be present on flat surfaces as well.  No timing during day when things are better or worse.  No seasonal or environmental changes make things better or worse.  She did receive prednisone a few days ago and this seemed to help somewhat.  Similarly, no rhyme or reason to her cough.  No relieving or exacerbating factors that she can readily identify.  No timing during day were things are better or worse.  No seasonal or environmental changes.  With the symptoms she went to the ED 11/2020 and had a CTA chest PE protocol performed which on my interpretation reveals no evidence of PE, scattered faint groundglass opacities bilaterally throughout all lobes consistent with atypical pneumonia versus resolving COVID-19 pneumonia.  She was given doxycycline.  Per neurology notes 12/2020 symptoms improved quite a bit with this intervention.   However patient does not recall this and states nothing is really helped.  PMH: Hypertension, MS Surgical history: Reviewed reviewed with patient, she denies surgeries Family history: Reviewed with patient, she denies respiratory illnesses in first-degree relatives Social history: Never smoker, lives with husband, lives in [place] / Pulmonary Flowsheets:   ACT:  Asthma Control Test ACT Total Score  07/17/2023  2:42 PM 23  05/08/2022 11:00 AM 17   MMRC:     No data to display          Epworth:      No data to display          Tests:   FENO:  No results found for: "NITRICOXIDE"  PFT:    Latest Ref Rng & Units 02/20/2021   11:47 AM  PFT Results  FVC-Pre L 2.35   FVC-Predicted Pre % 90   FVC-Post L 2.34   FVC-Predicted Post % 90   Pre FEV1/FVC % % 87   Post FEV1/FCV % % 88   FEV1-Pre L 2.03   FEV1-Predicted Pre % 99   FEV1-Post L 2.06   DLCO uncorrected ml/min/mmHg 9.65   DLCO UNC% % 49   DLCO corrected ml/min/mmHg 9.65   DLCO COR %Predicted % 49   DLVA Predicted % 68   TLC L 3.12   TLC % Predicted % 64   RV % Predicted % 48   Personally reviewed and interpreted as normal spirometry with moderate restriction based on TLC with severely reduced DLCO.  WALK:  No data to display          Imaging: Personally reviewed and as per EMR discussion this note CT high-res 03/2021.  This demonstrated on my interpretation scattered groundglass opacities with mild bronchiectasis scattered throughout concerning for ongoing inflammation from COVID-19 with the presence of bronchiectasis developing fibrosis although there is no frank fibrosis demonstrated on the scan.  Lab Results: Personally reviewed, no significant elevation of eosinophils and no anemia CBC    Component Value Date/Time   WBC 3.4 07/08/2023 1323   WBC 5.0 01/28/2023 1340   RBC 4.96 07/08/2023 1323   RBC 4.31 01/28/2023 1340   HGB 13.1 07/08/2023 1323   HCT 42.3 07/08/2023  1323   PLT 288 07/08/2023 1323   MCV 85 07/08/2023 1323   MCH 26.4 (L) 07/08/2023 1323   MCH 25.8 (L) 01/28/2023 1340   MCHC 31.0 (L) 07/08/2023 1323   MCHC 32.0 01/28/2023 1340   RDW 16.0 (H) 07/08/2023 1323   LYMPHSABS 1.4 07/08/2023 1323   EOSABS 0.2 07/08/2023 1323   BASOSABS 0.0 07/08/2023 1323    BMET    Component Value Date/Time   NA 137 01/28/2023 1340   NA 144 06/21/2022 1116   K 3.7 01/28/2023 1340   CL 103 01/28/2023 1340   CO2 25 01/28/2023 1340   GLUCOSE 92 01/28/2023 1340   BUN 11 01/28/2023 1340   BUN 17 06/21/2022 1116   CREATININE 0.89 01/28/2023 1340   CALCIUM 9.3 01/28/2023 1340   GFRNONAA >60 01/28/2023 1340   GFRAA 70 07/11/2020 1343    BNP No results found for: "BNP"  ProBNP No results found for: "PROBNP"  Specialty Problems   None  Allergies  Allergen Reactions   Ace Inhibitors Swelling    lips   Sulfa Antibiotics Hives and Itching   Triamterene Swelling   Amoxicillin Diarrhea   Benadryl [Diphenhydramine] Other (See Comments)    Difficulty swallowing   Dyazide [Hydrochlorothiazide W-Triamterene] Swelling   Lisinopril Swelling   Norvasc [Amlodipine Besylate] Swelling   Dilantin [Phenytoin Sodium Extended] Hives and Rash    Immunization History  Administered Date(s) Administered   Influenza,inj,Quad PF,6+ Mos 08/13/2018   Influenza-Unspecified 08/08/2014, 07/02/2019, 07/14/2021   Zoster, Live 09/07/2013    Past Medical History:  Diagnosis Date   Anemia    Asthma    Hypertension    Hypothyroidism    Multiple sclerosis (HCC)    Neuromuscular disorder (HCC)    Pre-diabetes    Vertigo    Vision abnormalities     Tobacco History: Social History   Tobacco Use  Smoking Status Never  Smokeless Tobacco Never   Counseling given: Not Answered   Continue to not smoke  Outpatient Encounter Medications as of 07/17/2023  Medication Sig   carvedilol (COREG) 6.25 MG tablet Take 6.25 mg by mouth 2 (two) times daily with a meal.    cetirizine (ZYRTEC) 10 MG tablet Take 1 tablet (10 mg total) by mouth daily.   ELIQUIS 5 MG TABS tablet TAKE 1 TABLET BY MOUTH TWICE A DAY   furosemide (LASIX) 20 MG tablet Take 20 mg by mouth 2 (two) times daily.   levothyroxine (SYNTHROID, LEVOTHROID) 50 MCG tablet Take 50 mcg by mouth daily before breakfast.   ocrelizumab 600 mg in sodium chloride 0.9 % 500 mL Inject 600 mg into the vein every 6 (six) months.   Saline (SIMPLY SALINE) 0.9 % AERS Place 1 spray into the nose as needed (congestion).   valACYclovir (VALTREX) 500 MG tablet  Take 1,000 mg by mouth daily.   No facility-administered encounter medications on file as of 07/17/2023.     Review of Systems  Review of Systems  N/a Physical Exam  BP (!) 148/86 (BP Location: Right Arm, Patient Position: Sitting, Cuff Size: Normal)   Pulse 83   Temp 98.4 F (36.9 C) (Oral)   Ht 5\' 3"  (1.6 m)   Wt 162 lb 9.6 oz (73.8 kg)   LMP 01/01/2017 (Exact Date)   SpO2 98%   BMI 28.80 kg/m   Wt Readings from Last 5 Encounters:  07/17/23 162 lb 9.6 oz (73.8 kg)  07/08/23 162 lb 8 oz (73.7 kg)  02/22/23 150 lb (68 kg)  02/18/23 156 lb 12.8 oz (71.1 kg)  01/28/23 153 lb (69.4 kg)    BMI Readings from Last 5 Encounters:  07/17/23 28.80 kg/m  07/08/23 28.79 kg/m  02/22/23 26.57 kg/m  02/18/23 27.78 kg/m  01/28/23 27.10 kg/m     Physical Exam General: Well-appearing, no acute distress Eyes: EOMI, no icterus Neck: Supple no JVP Cardiovascular: Regular rate and rhythm, no murmur Pulmonary: Inspiratory crackles left lower lung field, normal work of breathing Abdomen: Nondistended, bowel sounds present MSK: No synovitis, no joint effusion Neuro: Normal gait, no weakness Psych: Normal mood, full affect   Assessment & Plan:   Presumed asthma: Triggered by COVID-19 infection.  ICS/LABA without much improvement.  Doing well off maintenance inhaler..  Continue to use albuterol as needed with spacer.    Incidentally  discovered pulmonary embolus: Likely provoked in the setting of recent COVID infection as well as knee replacement.  Strong family history as well.  Continue Eliquis, 5 mg twice daily.  Recommend  lifelong anticoagulation given her family history.    Cough: Improved with Zyrtec.  Contribution of sinusitis, possible asthma as above.  Continue Zyrtec as needed.   Return in about 4 months (around 11/17/2023) for f/u Dr. Judeth Horn.   Pamela Burly, MD 07/17/2023

## 2023-07-17 NOTE — Patient Instructions (Signed)
Stay off the Trelegy  Continue Zyrtec as needed  No changes to medications otherwise  Return to clinic in 4 months or sooner as needed with Dr. Judeth Horn

## 2023-09-20 ENCOUNTER — Ambulatory Visit
Admission: RE | Admit: 2023-09-20 | Discharge: 2023-09-20 | Disposition: A | Discharge status: Home / Self Care | Payer: Medicare Other | Source: Ambulatory Visit | Attending: Emergency Medicine | Admitting: Emergency Medicine

## 2023-09-20 VITALS — BP 132/81 | HR 83 | Temp 98.7°F | Resp 16 | Ht 63.0 in | Wt 160.0 lb

## 2023-09-20 DIAGNOSIS — M79652 Pain in left thigh: Secondary | ICD-10-CM | POA: Diagnosis not present

## 2023-09-20 DIAGNOSIS — G35 Multiple sclerosis: Secondary | ICD-10-CM

## 2023-09-20 DIAGNOSIS — Z86711 Personal history of pulmonary embolism: Secondary | ICD-10-CM | POA: Diagnosis not present

## 2023-09-20 MED ORDER — BACLOFEN 10 MG PO TABS: 10.0000 mg | ORAL_TABLET | Freq: Three times a day (TID) | ORAL | 0 refills | Status: AC

## 2023-09-20 NOTE — ED Triage Notes (Signed)
Patient presents with pain in her right side of her thigh x 2/3 weeks. No treatment used.

## 2023-09-20 NOTE — ED Provider Notes (Signed)
[name]    CSN: [id number] Arrival date & time: 09/20/23  1440    HISTORY   Chief Complaint  Patient presents with   Leg Pain    Upper dull and aching thigh pain, but I do have Multiple Sclerosis.  Just want to make sure that it is not DVT. - Entered by patient   HPI Pamela Potter is a pleasant, 57 y.o. female with a known history of incidental pulmonary embolism and multiple sclerosis who presents to urgent care today. Patient complains of a dull, aching pain in her right upper thigh for the past 2 to 3 weeks.  Patient reports a history of pulmonary embolism in her family members, is concerned she may have a DVT and wants to be evaluated for this.  Patient states with her multiple sclerosis, she has a lot of muscle spasticity in her back which has also been bothering her a little bit more than usual for the past few weeks.  Patient denies swelling, warmth of right upper thigh.  The history is provided by the patient.   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    Home Medications    Prior to Admission medications   Medication Sig Start Date End Date Taking? Authorizing Provider  baclofen (LIORESAL) 10 MG tablet Take 1 tablet (10 mg total) by mouth 3 (three) times daily for 7 days. 09/20/23 09/27/23 Yes Theadora Rama Scales, PA-C  carvedilol (COREG) 6.25 MG tablet Take 6.25 mg by mouth 2 (two) times daily with a meal.    [provider]  cetirizine (ZYRTEC) 10 MG tablet Take 1 tablet (10 mg total) by mouth daily. 11/12/22   [place]ound, Acie Fredrickson, FNP  ELIQUIS 5 MG TABS tablet TAKE 1 TABLET BY MOUTH TWICE A DAY 06/19/23   Hunsucker, Lesia Sago, MD  furosemide (LASIX) 20 MG tablet Take 20 mg by mouth 2 (two) times daily. 06/21/22   [provider]  levothyroxine (SYNTHROID, LEVOTHROID) 50 MCG tablet Take 50 mcg by mouth daily before breakfast.    [provider]  ocrelizumab 600 mg in sodium chloride 0.9 % 500 mL Inject 600 mg into the vein every 6 (six) months.    [provider]  Saline (SIMPLY SALINE) 0.9 % AERS Place 1 spray into the nose as needed (congestion).    [provider]  valACYclovir (VALTREX) 500 MG tablet Take 1,000 mg by mouth daily.    [provider]  Family History Family History  Problem Relation Age of Onset   Hyperlipidemia Mother    Hypertension Mother    Stroke Father    Diabetes Father    Social History Social History   Tobacco Use   Smoking status: Never   Smokeless tobacco: Never  Vaping Use   Vaping status: Never Used  Substance Use Topics   Alcohol use: Never    Alcohol/week: 0.0 standard drinks of alcohol   Drug use: Never   Allergies   Ace inhibitors, Sulfa antibiotics, Triamterene, Amoxicillin, Benadryl [diphenhydramine], Dyazide [hydrochlorothiazide w-triamterene], Lisinopril, Norvasc [amlodipine besylate], and Dilantin [phenytoin sodium extended]  Review of Systems Review of Systems Pertinent findings revealed after performing a 14 point  review of systems has been noted in the history of present illness.  Physical Exam Vital Signs BP 132/81 (BP Location: Left Arm)   Pulse 83   Temp 98.7 F (37.1 C) (Oral)   Resp 16   Ht 5\' 3"  (1.6 m)   Wt 160 lb (72.6 kg)   LMP 01/01/2017 (Exact Date)   SpO2 97%   BMI 28.34 kg/m   No data found.  Physical Exam Vitals and nursing note reviewed.  Constitutional:      General: She is not in acute distress.    Appearance: Normal appearance.  HENT:     Head: Normocephalic and atraumatic.  Eyes:     Pupils: Pupils are equal, round, and reactive to light.  Cardiovascular:     Rate and Rhythm: Normal rate and regular rhythm.  Pulmonary:     Effort: Pulmonary effort is normal.     Breath sounds: Normal breath sounds.  Musculoskeletal:        General: No swelling, tenderness, deformity or signs of injury. Normal range of motion.     Cervical back: Normal range of motion and neck supple.  Skin:    General: Skin is warm and dry.     Findings: No erythema.  Neurological:     General: No focal deficit present.     Mental Status: She is alert and oriented to person, place, and time. Mental status is at baseline.  Psychiatric:        Mood and Affect: Mood normal.        Behavior: Behavior normal.        Thought Content: Thought content normal.        Judgment: Judgment normal.     Visual Acuity Right Eye Distance:   Left Eye Distance:   Bilateral Distance:    Right Eye Near:   Left Eye Near:    Bilateral Near:     UC Couse / Diagnostics / Procedures:     Radiology No results found.  Procedures Procedures (including critical care time) EKG  Pending results:  Labs Reviewed - No data to display  Medications Ordered in UC: Medications - No data to display  UC Diagnoses / Final Clinical Impressions(s)   I have reviewed the triage vital signs and the nursing notes.  Pertinent labs & imaging results that were available during my care of the patient were reviewed  by me and considered in my medical decision making (see chart for details).    Final diagnoses:  Pain in left thigh  History of pulmonary embolus (PE)  Multiple sclerosis (HCC)   Patient advised that we do not have the capability to rule out DVT here at urgent care and that she would need to go to the emergency room for this.  Patient states she is not interested in going to the emergency room right now because she does not want to wait.  Patient is agreeable to trying a muscle relaxer to see if this relieves her pain in her right thigh and was also agreeable to going to the emergency room if it does not.  Prescription for baclofen sent to pharmacy.  Please see discharge instructions below for details of plan of care as provided to patient. ED Prescriptions     Medication Sig Dispense Auth. Provider   baclofen (LIORESAL) 10 MG tablet Take 1 tablet (10 mg total) by mouth 3 (three) times daily for 7 days. 21 tablet Theadora Rama Scales, PA-C      PDMP not reviewed this encounter.  Pending results:  Labs Reviewed - No data to display    Discharge Instructions      When you pick up your prescription from the pharmacy, you can begin taking baclofen 10 mg.  This is a highly effective muscle relaxer and antispasmodic which should continue to provide you with relaxation of your tense muscles, allow you to sleep well and to keep your pain under control.  You can continue taking this medication 3 times daily as you need to.  If you find that this medication makes you too sleepy, you can break them in half for your daytime doses and, if needed double them for your nighttime dose.  Do not take more than 30 mg of baclofen in a 24-hour period.  If this does not relieve the pain in your left thigh, please consider going to one of the smaller emergency departments in the area for further evaluation of possible DVT in your left thigh.  Thank you for visiting [facility] today.  We  appreciate the opportunity to participate in your care.     Disposition Upon Discharge:  Condition: stable for discharge home  Patient presented with an acute illness with associated systemic symptoms and significant discomfort requiring urgent management. In my opinion, this is a condition that a prudent lay person (someone who possesses an average knowledge of health and medicine) may potentially expect to result in complications if not addressed urgently such as respiratory distress, impairment of bodily function or dysfunction of bodily organs.   Routine symptom specific, illness specific and/or disease specific instructions were discussed with the patient and/or caregiver at length.   As such, the patient has been evaluated and assessed, work-up was performed and treatment was provided in alignment with urgent care protocols and evidence based medicine.  Patient/parent/caregiver has been advised that the patient may require follow up for further testing and treatment if the symptoms continue in spite of treatment, as clinically indicated and appropriate.  Patient/parent/caregiver has been advised to return to the [facility] or PCP if no better; to PCP or the Emergency Department if new signs and symptoms develop, or if the current signs or symptoms continue to change or worsen for further workup, evaluation and treatment as clinically indicated and appropriate  The patient will follow up with their current PCP if and as advised. If the patient does not currently have a PCP we will assist them in obtaining one.   The patient may need specialty follow up if the symptoms continue, in spite of conservative treatment and management, for further workup, evaluation, consultation and treatment as clinically indicated and appropriate.  Patient/parent/caregiver verbalized understanding and agreement of plan as discussed.  All questions were addressed during visit.  Please see discharge instructions  below for  further details of plan.  This office note has been dictated using Teaching laboratory technician.  Unfortunately, this method of dictation can sometimes lead to typographical or grammatical errors.  I apologize for your inconvenience in advance if this occurs.  Please do not hesitate to reach out to me if clarification is needed.      Theadora Rama Scales, PA-C 09/20/23 1525

## 2023-09-20 NOTE — Discharge Instructions (Signed)
When you pick up your prescription from the pharmacy, you can begin taking baclofen 10 mg.  This is a highly effective muscle relaxer and antispasmodic which should continue to provide you with relaxation of your tense muscles, allow you to sleep well and to keep your pain under control.  You can continue taking this medication 3 times daily as you need to.  If you find that this medication makes you too sleepy, you can break them in half for your daytime doses and, if needed double them for your nighttime dose.  Do not take more than 30 mg of baclofen in a 24-hour period.  If this does not relieve the pain in your left thigh, please consider going to one of the smaller emergency departments in the area for further evaluation of possible DVT in your left thigh.  Thank you for visiting [facility] today.  We appreciate the opportunity to participate in your care.

## 2023-10-12 ENCOUNTER — Ambulatory Visit
Admission: RE | Admit: 2023-10-12 | Discharge: 2023-10-12 | Disposition: A | Discharge status: Home / Self Care | Payer: Medicare Other | Source: Ambulatory Visit | Attending: Physician Assistant | Admitting: Physician Assistant

## 2023-10-12 VITALS — BP 147/84 | HR 95 | Temp 98.0°F | Resp 18 | Ht 63.0 in | Wt 160.0 lb

## 2023-10-12 DIAGNOSIS — R87612 Low grade squamous intraepithelial lesion on cytologic smear of cervix (LGSIL): Secondary | ICD-10-CM | POA: Insufficient documentation

## 2023-10-12 DIAGNOSIS — R350 Frequency of micturition: Secondary | ICD-10-CM | POA: Insufficient documentation

## 2023-10-12 DIAGNOSIS — N3000 Acute cystitis without hematuria: Secondary | ICD-10-CM

## 2023-10-12 DIAGNOSIS — E663 Overweight: Secondary | ICD-10-CM | POA: Insufficient documentation

## 2023-10-12 LAB — POCT URINALYSIS DIP (MANUAL ENTRY)
Bilirubin, UA: NEGATIVE
Glucose, UA: NEGATIVE mg/dL
Ketones, POC UA: NEGATIVE mg/dL
Nitrite, UA: NEGATIVE
Protein Ur, POC: NEGATIVE mg/dL
Spec Grav, UA: 1.02 (ref 1.010–1.025)
Urobilinogen, UA: 1 U/dL
pH, UA: 6.5 (ref 5.0–8.0)

## 2023-10-12 MED ORDER — NITROFURANTOIN MONOHYD MACRO 100 MG PO CAPS
100.0000 mg | ORAL_CAPSULE | Freq: Two times a day (BID) | ORAL | 0 refills | Status: DC
Start: 2023-10-12 — End: 2024-01-13

## 2023-10-12 NOTE — ED Triage Notes (Signed)
 Possibly UTI - Entered by patient  "Started Thursday night with urinary frequency, this has continued, some burning on/off". No fever.

## 2023-10-12 NOTE — ED Provider Notes (Signed)
 [name]    CSN: [id number] Arrival date & time: 10/12/23  1152      History   Chief Complaint Chief Complaint  Patient presents with   Urinary Frequency    HPI Pamela Potter is a 58 y.o. female.   Patient here today for evaluation of urinary frequency and dysuria that started several nights ago.  She has not any fever.  The history is provided by the patient.  Urinary Frequency Pertinent negatives include no abdominal pain and no shortness of breath.    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/28/2022   History of penetrating keratoplasty 08/28/2022   Nuclear sclerotic cataract of both eyes 08/28/2022   Ocular hypertension of right eye 08/28/2022   Primary osteoarthritis of left knee 07/17/2022   H/O total knee replacement, left 07/17/2022   High risk medication use 01/05/2020   Left knee pain 01/05/2020   Atypical facial pain 07/08/2019   Vitamin D  deficiency 07/07/2018   Multiple sclerosis (HCC) 10/11/2016   Gait disturbance 10/11/2016   Urge incontinence of urine 10/11/2016   Numbness 10/11/2016   Corneal graft malfunction 11/21/2011   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      Home Medications    Prior to Admission medications   Medication Sig Start Date End Date Taking? Authorizing Provider  carvedilol (COREG) 6.25 MG tablet Take 6.25 mg by mouth 2 (two) times daily with a meal. 08/16/21  Yes [provider]  ELIQUIS  5 MG TABS tablet TAKE 1 TABLET BY MOUTH TWICE A DAY 06/19/23  Yes Hunsucker, Donnice SAUNDERS, MD  furosemide (LASIX) 20 MG tablet Take 20 mg by mouth 2 (two) times daily. 06/21/22  Yes [provider]  levothyroxine (SYNTHROID, LEVOTHROID) 50 MCG tablet Take 50 mcg by mouth daily before breakfast.   Yes [provider]  nitrofurantoin , macrocrystal-monohydrate, (MACROBID ) 100 MG capsule Take 1 capsule (100 mg total) by mouth 2 (two) times daily. 10/12/23  Yes Billy Asberry FALCON, PA-C  bisacodyl (DULCOLAX) 5 MG EC tablet Take 5 mg by mouth See admin instructions.    [provider]  cetirizine  (ZYRTEC ) 10 MG tablet Take 1 tablet (10 mg total) by mouth daily. 11/12/22   Hazen Darryle BRAVO, FNP  chlorhexidine  (PERIDEX ) 0.12 % solution Use as directed 5 mLs in the mouth or throat as directed.    [provider]  Cholecalciferol 50 MCG (2000 UT) TABS Take 2,000 Units by mouth as directed. 08/31/14   [provider]  dexamethasone  (DECADRON ) 4 MG tablet Take 4 mg by mouth 3 (three) times daily.  [provider]  diclofenac (VOLTAREN) 75 MG EC tablet Take 1 tablet by mouth 2 (two) times daily.    [provider]  Ergocalciferol  (VITAMIN D2 PO) Take 1,250 mcg by mouth as directed. ERGOCALCIFEROL  (VITAMIN D2) 1,250 MCG (50,000 UNIT) CAPSULE    [provider]  Ergocalciferol  (VITAMIN D2 PO) Take 1,250 mcg by mouth as directed.    [provider]  Ergocalciferol  10 MCG (400 UNIT) TABS Take by mouth. 08/16/21   [provider]  Fingolimod HCl (GILENYA) 0.5 MG CAPS     [provider]  fluticasone  (FLONASE ) 50 MCG/ACT nasal spray Place 1 spray  into both nostrils daily.    [provider]  Fluticasone -Umeclidin-Vilant (TRELEGY ELLIPTA ) 100-62.5-25 MCG/ACT AEPB Inhale into the lungs. 08/16/21   [provider]  Fluticasone -Umeclidin-Vilant (TRELEGY ELLIPTA ) 200-62.5-25 MCG/ACT AEPB Inhale 1 puff into the lungs daily.    [provider]  Fluticasone -Umeclidin-Vilant (TRELEGY ELLIPTA ) 200-62.5-25 MCG/ACT AEPB Inhale 1 Inhalation into the lungs as directed.    [provider]  Fluticasone -Umeclidin-Vilant (TRELEGY ELLIPTA ) 200-62.5-25 MCG/ACT AEPB Inhale 1 puff into the lungs daily.    [provider]  guaifenesin  (HUMIBID E) 400 MG TABS tablet Take 400 mg by mouth every 4 (four) hours as needed.    [provider]  HYDROcodone -acetaminophen  (NORCO/VICODIN) 5-325 MG tablet Take 1-2 tablets by mouth every 6 (six) hours as needed for severe pain (pain score 7-10) or moderate pain (pain score 4-6).    [provider]  ibuprofen (ADVIL) 400 MG tablet Take 400 mg by mouth every 4 (four) hours as needed.    [provider]  ibuprofen (ADVIL) 600 MG tablet Take 600 mg by mouth every 6 (six) hours as needed.    [provider]  influenza vac split quadrivalent PF (AFLURIA QUADRIVALENT) 0.5 ML injection Inject 0.5 mLs into the muscle once.    [provider]  Iron, Ferrous Sulfate, 325 (65 Fe) MG TABS Take 2 tablets by mouth daily. 08/31/14   [provider]  lidocaine (XYLOCAINE) 2 % solution Use as directed 5 mLs in the mouth or throat as directed.    [provider]  molnupiravir EUA (LAGEVRIO) 200 MG CAPS capsule Take 4 capsules by mouth 2 (two) times daily.    [provider]  nortriptyline (PAMELOR) 25 MG capsule Take 25 mg by mouth as directed.    [provider]  ocrelizumab  (OCREVUS ) 300 MG/10ML injection Inject into the vein.    [provider]  ocrelizumab  600 mg in sodium chloride  0.9 % 500 mL Inject 600 mg  into the vein every 6 (six) months.    [provider]  omeprazole (PRILOSEC) 40 MG capsule Take 40 mg by mouth daily.    [provider]  ondansetron  (ZOFRAN ) 4 MG tablet Take 4 mg by mouth every 8 (eight) hours as needed.    [provider]  Oxcarbazepine  (TRILEPTAL ) 300 MG tablet Take 300 mg by mouth as directed.    [provider]  phenazopyridine (PYRIDIUM) 200 MG tablet Take 200 mg by mouth 3 (three) times daily as needed for pain.    [provider]  polyethylene glycol-electrolytes (NULYTELY) 420 g solution Take 4,000 mLs by mouth See admin instructions.    [provider]  prednisoLONE acetate (PRED FORTE) 1 % ophthalmic suspension Place 1 drop into both eyes 2 (two) times daily. 08/07/23   [provider]  prednisoLONE acetate (PRED MILD) 0.12 % ophthalmic suspension Place  1 drop into the right eye as directed. 04/09/22   [provider]  predniSONE  (STERAPRED UNI-PAK 21 TAB) 5 MG (21) TBPK tablet Take 5 mg by mouth as directed. 04/24/23   [provider]  predniSONE  (STERAPRED UNI-PAK 48 TAB) 5 MG (48) TBPK tablet Take 5 mg by mouth as directed. 04/24/23   [provider]  Saline (SIMPLY SALINE) 0.9 % AERS Place 1 spray into the nose as needed (congestion).    [provider]  temazepam (RESTORIL) 15 MG capsule Take 15 mg by mouth as directed.    [provider]  timolol (TIMOPTIC) 0.5 % ophthalmic solution Place 1 drop into the right eye daily at 6 (six) AM. 07/13/22   [provider]  tiZANidine  (ZANAFLEX ) 4 MG tablet Take 1 tablet by mouth every 6 (six) hours as needed. 07/27/22   [provider]  traZODone  (DESYREL ) 50 MG tablet Take 50 mg by mouth at bedtime. 06/20/22   [provider]  triamcinolone cream (KENALOG) 0.1 % Apply 1 Application topically 2 (two) times daily.    [provider]  TRIAMTERENE PO Take by mouth.    [provider]   triamterene-hydrochlorothiazide (MAXZIDE-25) 37.5-25 MG tablet Take 1 tablet by mouth daily.    [provider]  UNABLE TO FIND NORTREL 7/7/7 (28) 0.5 MG/0.75 MG/1 MG-35 MCG TABLET TAKE 1 TABLET BY MOUTH ONCE DAILY    [provider]  valACYclovir (VALTREX) 1000 MG tablet Take 1 tablet by mouth daily. 09/02/23   [provider]  valACYclovir (VALTREX) 500 MG tablet Take 1,000 mg by mouth daily.    [provider]  zolpidem (AMBIEN) 5 MG tablet Take 5 mg by mouth as directed.    [provider]  Zoster Vaccine Live, PF, (ZOSTAVAX) 19400 UNT/0.65ML injection Inject 0.65 mLs into the skin See admin instructions.    [provider]    Family History Family History  Problem Relation Age of Onset   Hyperlipidemia Mother    Hypertension Mother    Stroke Father    Diabetes Father     Social History Social History   Tobacco Use   Smoking status: Never   Smokeless tobacco: Never  Vaping Use   Vaping status: Never Used  Substance Use Topics   Alcohol use: Never    Alcohol/week: 0.0 standard drinks of alcohol   Drug use: Never     Allergies   Diphenhydramine , Diphenhydramine -phenylephrine , Ace inhibitors, Sulfa antibiotics, Triamterene, Amoxicillin, Hydrochlorothiazide w-triamterene, Lisinopril, Norvasc [amlodipine besylate], Amlodipine, Dilantin [phenytoin sodium extended], and Phenytoin   Review of Systems Review of Systems  Constitutional:  Negative for chills and fever.  Eyes:  Negative for discharge and redness.  Respiratory:  Negative for shortness of breath.   Gastrointestinal:  Negative for abdominal pain, nausea and vomiting.  Genitourinary:  Positive for dysuria and frequency.     Physical Exam Triage Vital Signs ED Triage Vitals  Encounter Vitals Group     BP      Systolic BP Percentile      Diastolic BP Percentile      Pulse      Resp      Temp      Temp src      SpO2      Weight      Height      Head  Circumference      Peak Flow      Pain Score      Pain Loc  Pain Education      Exclude from Growth Chart    No data found.  Updated Vital Signs BP (!) 147/84 (BP Location: Right Arm)   Pulse 95   Temp 98 F (36.7 C) (Oral)   Resp 18   Ht 5' 3 (1.6 m)   Wt 160 lb (72.6 kg)   LMP 01/01/2017 (Exact Date)   SpO2 98%   BMI 28.34 kg/m   Visual Acuity Right Eye Distance:   Left Eye Distance:   Bilateral Distance:    Right Eye Near:   Left Eye Near:    Bilateral Near:     Physical Exam Vitals and nursing note reviewed.  Constitutional:      General: She is not in acute distress.    Appearance: Normal appearance. She is not ill-appearing.  HENT:     Head: Normocephalic and atraumatic.  Eyes:     Conjunctiva/sclera: Conjunctivae normal.  Cardiovascular:     Rate and Rhythm: Normal rate.  Pulmonary:     Effort: Pulmonary effort is normal. No respiratory distress.  Neurological:     Mental Status: She is alert.  Psychiatric:        Mood and Affect: Mood normal.        Behavior: Behavior normal.        Thought Content: Thought content normal.      UC Treatments / Results  Labs (all labs ordered are listed, but only abnormal results are displayed) Labs Reviewed  POCT URINALYSIS DIP (MANUAL ENTRY) - Abnormal; Notable for the following components:      Result Value   Clarity, UA cloudy (*)    Blood, UA trace-intact (*)    Leukocytes, UA Small (1+) (*)    All other components within normal limits  URINE CULTURE    EKG   Radiology No results found.  Procedures Procedures (including critical care time)  Medications Ordered in UC Medications - No data to display  Initial Impression / Assessment and Plan / UC Course  I have reviewed the triage vital signs and the nursing notes.  Pertinent labs & imaging results that were available during my care of the patient were reviewed by me and considered in my medical decision making (see chart for  details).    Macrobid  prescribed to cover UTI and urine culture ordered.  Recommended follow-up if no gradual improvement with any further concerns.  Final Clinical Impressions(s) / UC Diagnoses   Final diagnoses:  Acute cystitis without hematuria   Discharge Instructions   None    ED Prescriptions     Medication Sig Dispense Auth. Provider   nitrofurantoin , macrocrystal-monohydrate, (MACROBID ) 100 MG capsule Take 1 capsule (100 mg total) by mouth 2 (two) times daily. 10 capsule Billy Asberry FALCON, PA-C      PDMP not reviewed this encounter.   Billy Asberry FALCON, PA-C 10/12/23 1302

## 2023-10-13 LAB — URINE CULTURE: Culture: NO GROWTH

## 2023-10-21 ENCOUNTER — Other Ambulatory Visit: Payer: Self-pay | Admitting: Obstetrics and Gynecology

## 2023-10-25 LAB — SURGICAL PATHOLOGY

## 2023-10-30 ENCOUNTER — Telehealth: Payer: Self-pay | Admitting: *Deleted

## 2023-10-30 DIAGNOSIS — Z0289 Encounter for other administrative examinations: Secondary | ICD-10-CM

## 2023-10-30 NOTE — Telephone Encounter (Signed)
[employer]/disability form completed on Amy Lomax,NP desk for signature.

## 2023-10-30 NOTE — Telephone Encounter (Signed)
 Gave completed/signed form back to medical records to process for pt.

## 2023-10-31 ENCOUNTER — Telehealth: Payer: Self-pay | Admitting: *Deleted

## 2023-10-31 NOTE — Telephone Encounter (Signed)
Pt [place] form faxed on 10/31/2023

## 2023-11-10 ENCOUNTER — Emergency Department (HOSPITAL_BASED_OUTPATIENT_CLINIC_OR_DEPARTMENT_OTHER): Payer: Medicare Other

## 2023-11-10 ENCOUNTER — Encounter (HOSPITAL_BASED_OUTPATIENT_CLINIC_OR_DEPARTMENT_OTHER): Payer: Self-pay | Admitting: Emergency Medicine

## 2023-11-10 ENCOUNTER — Emergency Department (HOSPITAL_BASED_OUTPATIENT_CLINIC_OR_DEPARTMENT_OTHER)
Admission: EM | Admit: 2023-11-10 | Discharge: 2023-11-10 | Disposition: A | Discharge status: Home / Self Care | Payer: Medicare Other | Attending: Emergency Medicine | Admitting: Emergency Medicine

## 2023-11-10 DIAGNOSIS — E039 Hypothyroidism, unspecified: Secondary | ICD-10-CM | POA: Diagnosis not present

## 2023-11-10 DIAGNOSIS — I10 Essential (primary) hypertension: Secondary | ICD-10-CM | POA: Insufficient documentation

## 2023-11-10 DIAGNOSIS — R0789 Other chest pain: Secondary | ICD-10-CM

## 2023-11-10 DIAGNOSIS — Z7901 Long term (current) use of anticoagulants: Secondary | ICD-10-CM | POA: Insufficient documentation

## 2023-11-10 DIAGNOSIS — Z79899 Other long term (current) drug therapy: Secondary | ICD-10-CM | POA: Insufficient documentation

## 2023-11-10 DIAGNOSIS — R079 Chest pain, unspecified: Secondary | ICD-10-CM | POA: Diagnosis present

## 2023-11-10 HISTORY — DX: Other pulmonary embolism without acute cor pulmonale: I26.99

## 2023-11-10 LAB — BASIC METABOLIC PANEL
Anion gap: 9 (ref 5–15)
BUN: 14 mg/dL (ref 6–20)
CO2: 24 mmol/L (ref 22–32)
Calcium: 9.4 mg/dL (ref 8.9–10.3)
Chloride: 102 mmol/L (ref 98–111)
Creatinine, Ser: 0.92 mg/dL (ref 0.44–1.00)
GFR, Estimated: >60 mL/min (ref >60)
Glucose, Bld: 100 mg/dL — ABNORMAL HIGH (ref 70–99)
Potassium: 4 mmol/L (ref 3.5–5.1)
Sodium: 135 mmol/L (ref 135–145)

## 2023-11-10 LAB — CBC WITH DIFFERENTIAL/PLATELET
Abs Immature Granulocytes: 0.01 10*3/uL (ref 0.00–0.07)
Basophils Absolute: 0 10*3/uL (ref 0.0–0.1)
Basophils Relative: 1 %
Eosinophils Absolute: 0.1 10*3/uL (ref 0.0–0.5)
Eosinophils Relative: 3 %
HCT: 40.4 % (ref 36.0–46.0)
Hemoglobin: 13.1 g/dL (ref 12.0–15.0)
Immature Granulocytes: 0 %
Lymphocytes Relative: 33 %
Lymphs Abs: 1.2 10*3/uL (ref 0.7–4.0)
MCH: 26.8 pg (ref 26.0–34.0)
MCHC: 32.4 g/dL (ref 30.0–36.0)
MCV: 82.8 fL (ref 80.0–100.0)
Monocytes Absolute: 0.3 10*3/uL (ref 0.1–1.0)
Monocytes Relative: 8 %
Neutro Abs: 2.1 10*3/uL (ref 1.7–7.7)
Neutrophils Relative %: 55 %
Platelets: 285 10*3/uL (ref 150–400)
RBC: 4.88 MIL/uL (ref 3.87–5.11)
RDW: 15.1 % (ref 11.5–15.5)
WBC: 3.7 10*3/uL — ABNORMAL LOW (ref 4.0–10.5)
nRBC: 0 % (ref 0.0–0.2)

## 2023-11-10 LAB — TROPONIN I (HIGH SENSITIVITY)
Troponin I (High Sensitivity): 2 ng/L (ref <18)
Troponin I (High Sensitivity): 2 ng/L (ref <18)

## 2023-11-10 LAB — D-DIMER, QUANTITATIVE: D-Dimer, Quant: <0.27 ug{FEU}/mL (ref 0.00–0.50)

## 2023-11-10 NOTE — ED Provider Notes (Addendum)
[place] [facility] Provider Note   CSN: [id number] Arrival date & time: 11/10/23  1603     History  Chief Complaint  Patient presents with   Chest Pain    Pamela Potter is a 58 y.o. female.  Patient with 1 week history of intermittent chest pain substernal does go to left upper extremity dull and heavy.  No significant shortness of breath no nausea or vomiting.  Patient not known to have any coronary artery disease.  Patient's past medical history significant for hypertension neuromuscular disorder muscular sclerosis prediabetic hypothyroidism history of pulmonary embolism in the past patient is not on any blood thinners.  Patient denies any leg swelling.       Home Medications Prior to Admission medications   Medication Sig Start Date End Date Taking? Authorizing Provider  bisacodyl (DULCOLAX) 5 MG EC tablet Take 5 mg by mouth See admin instructions.    [provider]  carvedilol (COREG) 6.25 MG tablet Take 6.25 mg by mouth 2 (two) times daily with a meal. 08/16/21   [provider]  cetirizine (ZYRTEC) 10 MG tablet Take 1 tablet (10 mg total) by mouth daily. 11/12/22   Gustavus Bryant, FNP  chlorhexidine (PERIDEX) 0.12 % solution Use as directed 5 mLs in the mouth or throat as directed.    [provider]  Cholecalciferol 50 MCG (2000 UT) TABS Take 2,000 Units by mouth as directed. 08/31/14   [provider]  dexamethasone (DECADRON) 4 MG tablet Take 4 mg by mouth 3 (three) times daily.    [provider]  diclofenac (VOLTAREN) 75 MG EC tablet Take 1 tablet by mouth 2 (two) times daily.    [provider]  ELIQUIS 5 MG TABS tablet TAKE 1 TABLET BY MOUTH TWICE A DAY 06/19/23   Hunsucker, Lesia Sago, MD  Ergocalciferol (VITAMIN D2 PO) Take 1,250 mcg by mouth as directed. ERGOCALCIFEROL (VITAMIN D2) 1,250 MCG (50,000 UNIT) CAPSULE    [provider]  Ergocalciferol (VITAMIN D2 PO) Take  1,250 mcg by mouth as directed.    [provider]  Ergocalciferol 10 MCG (400 UNIT) TABS Take by mouth. 08/16/21   [provider]  Fingolimod HCl (GILENYA) 0.5 MG CAPS     [provider]  fluticasone (FLONASE) 50 MCG/ACT nasal spray Place 1 spray into both nostrils daily.    [provider]  Fluticasone-Umeclidin-Vilant (TRELEGY ELLIPTA) 100-62.5-25 MCG/ACT AEPB Inhale into the lungs. 08/16/21   [provider]  Fluticasone-Umeclidin-Vilant (TRELEGY ELLIPTA) 200-62.5-25 MCG/ACT AEPB Inhale 1 puff into the lungs daily.    [provider]  Fluticasone-Umeclidin-Vilant (TRELEGY ELLIPTA) 200-62.5-25 MCG/ACT AEPB Inhale 1 Inhalation into the lungs as directed.    [provider]  Fluticasone-Umeclidin-Vilant (TRELEGY ELLIPTA) 200-62.5-25 MCG/ACT AEPB Inhale 1 puff into the lungs daily.    [provider]  furosemide (LASIX) 20 MG tablet Take 20 mg by mouth 2 (two) times daily. 06/21/22   [provider]  guaifenesin (HUMIBID E) 400 MG TABS tablet Take 400 mg by mouth every 4 (four) hours as needed.    [provider]  HYDROcodone-acetaminophen (NORCO/VICODIN) 5-325 MG tablet Take 1-2 tablets by mouth every 6 (six) hours as needed for severe pain (pain score 7-10) or moderate pain (pain score 4-6).    [provider]  ibuprofen (ADVIL) 400 MG tablet Take 400 mg by mouth every 4 (four) hours as needed.    [provider]  ibuprofen (ADVIL)  600 MG tablet Take 600 mg by mouth every 6 (six) hours as needed.    [provider]  influenza vac split quadrivalent PF (AFLURIA QUADRIVALENT) 0.5 ML injection Inject 0.5 mLs into the muscle once.    [provider]  Iron, Ferrous Sulfate, 325 (65 Fe) MG TABS Take 2 tablets by mouth daily. 08/31/14   [provider]  levothyroxine (SYNTHROID, LEVOTHROID) 50 MCG tablet Take 50 mcg by mouth daily before breakfast.    [provider]  lidocaine (XYLOCAINE) 2 % solution Use as directed 5 mLs in the mouth or throat as directed.    [provider]  molnupiravir EUA (LAGEVRIO) 200 MG CAPS capsule Take 4 capsules by mouth 2 (two) times daily.    [provider]  nitrofurantoin, macrocrystal-monohydrate, (MACROBID) 100 MG capsule Take 1 capsule (100 mg total) by mouth 2 (two) times daily. 10/12/23   Tomi Bamberger, PA-C  nortriptyline (PAMELOR) 25 MG capsule Take 25 mg by mouth as directed.    [provider]  ocrelizumab (OCREVUS) 300 MG/10ML injection Inject into the vein.    [provider]  ocrelizumab 600 mg in sodium chloride 0.9 % 500 mL Inject 600 mg into the vein every 6 (six) months.    [provider]  omeprazole (PRILOSEC) 40 MG capsule Take 40 mg by mouth daily.    [provider]  ondansetron (ZOFRAN) 4 MG tablet Take 4 mg by mouth every 8 (eight) hours as needed.    [provider]  Oxcarbazepine (TRILEPTAL) 300 MG tablet Take 300 mg by mouth as directed.    [provider]  phenazopyridine (PYRIDIUM) 200 MG tablet Take 200 mg by mouth 3 (three) times daily as needed for pain.    [provider]  polyethylene glycol-electrolytes (NULYTELY) 420 g solution Take 4,000 mLs by mouth See admin instructions.    [provider]  prednisoLONE acetate (PRED FORTE) 1 % ophthalmic suspension Place 1 drop into both eyes 2 (two) times daily. 08/07/23   [provider]  prednisoLONE acetate (PRED MILD) 0.12 % ophthalmic suspension Place 1 drop into the right eye as directed. 04/09/22   [provider]  predniSONE (STERAPRED UNI-PAK 21 TAB) 5 MG (21) TBPK tablet Take 5 mg by mouth as directed. 04/24/23   [provider]  predniSONE (STERAPRED UNI-PAK 48 TAB) 5 MG (48) TBPK tablet Take 5 mg by mouth as directed. 04/24/23   [provider]  Saline (SIMPLY SALINE) 0.9 % AERS Place 1 spray into the nose as needed  (congestion).    [provider]  temazepam (RESTORIL) 15 MG capsule Take 15 mg by mouth as directed.    [provider]  timolol (TIMOPTIC) 0.5 % ophthalmic solution Place 1 drop into the right eye daily at 6 (six) AM. 07/13/22   [provider]  tiZANidine (ZANAFLEX) 4 MG tablet Take 1 tablet by mouth every 6 (six) hours as needed. 07/27/22   [provider]  traZODone (DESYREL) 50 MG tablet Take 50 mg by mouth at bedtime. 06/20/22   [provider]  triamcinolone cream (KENALOG) 0.1 % Apply 1 Application topically 2 (two) times daily.    [provider]  TRIAMTERENE PO Take by mouth.    [provider]  triamterene-hydrochlorothiazide (MAXZIDE-25) 37.5-25 MG tablet Take 1 tablet by mouth daily.    [provider]  UNABLE TO FIND NORTREL 7/7/7 (28) 0.5 MG/0.75 MG/1 MG-35 MCG TABLET TAKE 1 TABLET  BY MOUTH ONCE DAILY    [provider]  valACYclovir (VALTREX) 1000 MG tablet Take 1 tablet by mouth daily. 09/02/23   [provider]  valACYclovir (VALTREX) 500 MG tablet Take 1,000 mg by mouth daily.    [provider]  zolpidem (AMBIEN) 5 MG tablet Take 5 mg by mouth as directed.    [provider]  Zoster Vaccine Live, PF, (ZOSTAVAX) 16109 UNT/0.65ML injection Inject 0.65 mLs into the skin See admin instructions.    [provider]      Allergies    Diphenhydramine, Diphenhydramine-phenylephrine, Ace inhibitors, Sulfa antibiotics, Triamterene, Amoxicillin, Hydrochlorothiazide w-triamterene, Lisinopril, Norvasc [amlodipine besylate], Amlodipine, Dilantin [phenytoin sodium extended], and Phenytoin    Review of Systems   Review of Systems  Constitutional:  Negative for chills and fever.  HENT:  Negative for ear pain and sore throat.   Eyes:  Negative for pain and visual disturbance.  Respiratory:  Negative for cough and shortness of breath.   Cardiovascular:  Positive for chest pain.  Negative for palpitations.  Gastrointestinal:  Negative for abdominal pain and vomiting.  Genitourinary:  Negative for dysuria and hematuria.  Musculoskeletal:  Negative for arthralgias and back pain.  Skin:  Negative for color change and rash.  Neurological:  Negative for seizures and syncope.  All other systems reviewed and are negative.   Physical Exam Updated Vital Signs BP (!) 160/89 (BP Location: Left Arm)   Pulse 72   Temp 98.3 F (36.8 C) (Oral)   Resp (!) 22   Ht 1.6 m (5\' 3" )   Wt 72.6 kg   LMP 01/01/2017 (Exact Date)   SpO2 100%   BMI 28.34 kg/m  Physical Exam Vitals and nursing note reviewed.  Constitutional:      General: She is not in acute distress.    Appearance: Normal appearance. She is well-developed.  HENT:     Head: Normocephalic and atraumatic.  Eyes:     Conjunctiva/sclera: Conjunctivae normal.     Pupils: Pupils are equal, round, and reactive to light.  Cardiovascular:     Rate and Rhythm: Normal rate and regular rhythm.     Heart sounds: No murmur heard. Pulmonary:     Effort: Pulmonary effort is normal. No respiratory distress.     Breath sounds: Normal breath sounds. No wheezing, rhonchi or rales.  Abdominal:     Palpations: Abdomen is soft.     Tenderness: There is no abdominal tenderness.  Musculoskeletal:        General: No swelling.     Cervical back: Normal range of motion and neck supple.     Right lower leg: No edema.     Left lower leg: No edema.  Skin:    General: Skin is warm and dry.     Capillary Refill: Capillary refill takes less than 2 seconds.  Neurological:     General: No focal deficit present.     Mental Status: She is alert and oriented to person, place, and time.  Psychiatric:        Mood and Affect: Mood normal.     ED Results / Procedures / Treatments   Labs (all labs ordered are listed, but only abnormal results are displayed) Labs Reviewed  BASIC METABOLIC PANEL - Abnormal; Notable for the following  components:      Result Value   Glucose, Bld 100 (*)    All other components within normal limits  CBC WITH DIFFERENTIAL/PLATELET - Abnormal; Notable for the following  components:   WBC 3.7 (*)    All other components within normal limits  D-DIMER, QUANTITATIVE  TROPONIN I (HIGH SENSITIVITY)  TROPONIN I (HIGH SENSITIVITY)    EKG EKG Interpretation Date/Time:  Sunday November 10 2023 16:19:23 EST Ventricular Rate:  73 PR Interval:  152 QRS Duration:  66 QT Interval:  372 QTC Calculation: 409 R Axis:   -1  Text Interpretation: Normal sinus rhythm Possible Left atrial enlargement Anterior infarct , age undetermined Abnormal ECG When compared with ECG of 28-Jan-2023 13:43, PREVIOUS ECG IS PRESENT No significant change since last tracing Confirmed by Vanetta Mulders [phone]) on 11/10/2023 4:27:12 PM  Radiology DG Chest 2 View Result Date: 11/10/2023 CLINICAL DATA:  cp EXAM: CHEST - 2 VIEW COMPARISON:  01/28/2023 FINDINGS: Some linear atelectasis or scarring anteriorly at the right lung base as before. Lungs otherwise clear. No pneumothorax. Heart size and mediastinal contours are within normal limits. No effusion. Visualized bones unremarkable. IMPRESSION: No acute cardiopulmonary disease. Electronically Signed   By: Corlis Leak M.D.   On: 11/10/2023 16:45    Procedures Procedures    Medications Ordered in ED Medications - No data to display  ED Course/ Medical Decision Making/ A&P                                 Medical Decision Making Amount and/or Complexity of Data Reviewed Labs: ordered. Radiology: ordered.   EKG without any findings consistent with a STEMI.  Chest x-ray negative troponins x 2 are both 2.  CBC white count 3.7 hemoglobin 13.1 platelets 285 patient metabolic panel glucose 100 electrolytes normal and renal function test GFR greater than 60.  Patient D-dimer is less than 0.27.  No explanation for the chest pain but seems not to be an acute cardiac event.   Also no evidence of any concerns for blood clots.  Or pulmonary embolus.  Patient's blood pressure is a little elevated here.  Recommend that she keep a log book.  And then follow-up with her primary care doctor.  Will also give her referral to cardiology since she is over the age of 4 for echocardiogram and perhaps a nonexercise stress test.  Patient stable for discharge home.   Final Clinical Impression(s) / ED Diagnoses Final diagnoses:  Atypical chest pain  Primary hypertension    Rx / DC Orders ED Discharge Orders     None         Vanetta Mulders, MD 11/10/23 2010    Vanetta Mulders, MD 11/10/23 2011

## 2023-11-10 NOTE — Discharge Instructions (Signed)
Workup here today for the chest pain no evidence of any acute cardiac event or any evidence of any blood clot in the lungs.  Chest x-ray was also normal.  However blood pressure is a little bit on the high side.  Recommend to keep a log book follow-up with your doctor.  Referral information provided to cardiology for additional evaluation since you are over the age of 65.  Return for any new or worse symptoms.

## 2023-11-10 NOTE — ED Notes (Signed)
Pt hooked up to the monitor with the 5 lead, BP cuff and pulse ox ?

## 2023-11-10 NOTE — ED Triage Notes (Signed)
Pt w/ c/o CP x 1 wk; pain is dull and heavy and goes across middle chest; pain radiates to LUE

## 2023-11-25 ENCOUNTER — Other Ambulatory Visit (HOSPITAL_BASED_OUTPATIENT_CLINIC_OR_DEPARTMENT_OTHER): Payer: Self-pay | Admitting: Family Medicine

## 2023-11-25 ENCOUNTER — Ambulatory Visit (HOSPITAL_BASED_OUTPATIENT_CLINIC_OR_DEPARTMENT_OTHER)
Admission: RE | Admit: 2023-11-25 | Discharge: 2023-11-25 | Disposition: A | Discharge status: Home / Self Care | Payer: Medicare Other | Source: Ambulatory Visit | Attending: Family Medicine | Admitting: Family Medicine

## 2023-11-25 DIAGNOSIS — R600 Localized edema: Secondary | ICD-10-CM | POA: Diagnosis present

## 2023-12-23 ENCOUNTER — Other Ambulatory Visit: Payer: Self-pay | Admitting: Pulmonary Disease

## 2023-12-24 ENCOUNTER — Ambulatory Visit: Payer: Medicare Other | Admitting: Pulmonary Disease

## 2024-01-03 ENCOUNTER — Other Ambulatory Visit: Payer: Self-pay | Admitting: Orthopaedic Surgery

## 2024-01-03 DIAGNOSIS — M25561 Pain in right knee: Secondary | ICD-10-CM

## 2024-01-09 NOTE — Progress Notes (Signed)
 Chief Complaint  Patient presents with   Follow-up    Pt in room 1.alone. Here for MS follow up. DMT: Emogene Morgan. Next infusion date: 02/18/2024. Pt reports doing well, no falls, no concerns.     HISTORY OF PRESENT ILLNESS:  01/13/24 ALL:  Pamela Potter is a 58 y.o. female here today for follow up for RRMS. She continues Ocrevus infusions. Last infusion in November, next scheduled in May. Last brain and cervical MRI were stable 07/2022.   She feels that she is fairly stable. She continues to note a pin and needle/tingling sensation from head to toe. Not painful.  No changes in gait. She is s/p TKR of left knee. She has occasional pain. She started having right knee pain and swelling after resuming an exercise regimen walking on treadmill. CT scan showed arthritis. She is being scheduled for MRI 01/23/24. No weakness.   Mood is good. She denies depression. She goes to bed around 12am, wakes at 6 to let dog out then sleeps until 11am. She feels that once she is able to get up and move around she feels ok. She does not usually need to take naps during the day.   Bowel/bladder habits are unchanged. She feels urinary frequency is better. She has had some dysuria, recently. Diagnosed with vaginal dryness and started on estrogen cream.   She continues vitamin D supplements, 2000iu, OTC but admits that she is not taking consistently.   HISTORY (copied from Dr Bonnita Hollow previous note)  Pamela Potter is a 58 y.o. woman with relapsing remitting multiple sclerosis.   Update 12/20/2022 She is on Ocrevus. Her last infusion was 08/07/2022.  She feels her MS is doing ok and has no recent exacerbation or new MS symptoms.   She tolerates Ocrevus well.   IgG was low normal at 606 at the last check in November 2023   Gait is stable.   She is a litle off balanced but does not need a cane  She does worse in heat. She needs to hold the bannister on stairs.    She feels her right leg does worse than her left leg.      She gets some tingling in her feet but this is stable.    She has urinary urgency and occasional incontinence and wears pads.    She had a TI last year.   No hesitancy.      Vision has done well.   She has keratoconus and wears special contacts.   She had OD surgery   She has daily fatigue taht has seemed worse since Covid in late 2021.   She is a little better but then had Covid last Oct/Nov.      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  She often does not fall asleep for a few hours and ten sleeps in late.    She is often hot at night and has trouble getting comfortable.  She twists a lot at night even while asleep.     She never tried the  trazodone .  She denies depression.     She notes verbal fluency ans short term memory issues are worse.     She has right hip pain and left knee pain. She had left knee replacement 1o/2023.   She is on an anticoagulant for PE (she never had a DVT in leg that she was aware of).        MS History: She was diagnosed  with MS in 1991 after presenting with gait disturbance, weakness and vertigo. An MRI of the brain showed plaques and a lumbar puncture were reportedly consistent with MS. She saw Dr. Arville Go a couple years later and was started her on Avonex.   She stayed on Avonex for about 10 years but then had new lesions on her MRI. She was referred to Dr. Leotis Shames. He placed her on Tysabri and she stayed on for a few years. 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 Unfortunately, on Gilenya, she had some MRI progression and Tysabri was restarted. . Of note, her JCV antibody titers were initially less than 0.9. However, over the last couple years her titers have increased and her last JCV antibody was 1.03. She was switched to ocrelizumab and had her first dose the end of June 2017 and her second dose in mid July. She has not noted any exacerbations while on ocrelizumab.    DATA: MRI images from 02/10/2016 show classic T2/FLAIR hyperintense foci in the periventricular, juxtacortical and deep white matter of both hemispheres and also in the pons and right middle cerebellar peduncle in a pattern and configuration consistent with chronic demyelinating plaque associated with MS.   There were no acute findings and no change when compared to the previous MRI, from 05/17/2014   The MRI of the cervical and thoracic spine 2017 showed foci in the upper cervical spine and in the mid to lower thoracic spine. None of these were acute.       I reviewed her labs. Her JCV antibody on 12/25/2015 was 1.03 (middle positive). She had normal CD4 and CD8 cells.   Hepatitis B labs and Quantiferon TB test were negative or nonreactive.   MRI 02/12/2020 cervical spine:  Patchy T2 hyperintense foci within the upper cervical spine as described above.  None of the foci appear to be acute and there are no definite new lesions compared to the previous MRI from 2015.   Small left paramedian disc protrusion at C6-C7, not present on the 2015 MRI, that does not lead to nerve root compression or spinal stenosis   MRI 02/12/2020 Brain:  Multiple T2/FLAIR hyperintense foci in the brainstem, middle cerebellar peduncles and hemispheres in a pattern and configuration consistent with chronic demyelinating plaque associated with multiple sclerosis.  None of the foci appear to be acute.  Compared to the MRI from 01/08/2018, there are no new lesions.   REVIEW OF SYSTEMS: Out of a complete 14 system review of symptoms, the patient complains only of the following symptoms, fatigue, shortness of breath, knee pain, swelling in lower ext, paresthesias and all other reviewed systems are negative.   ALLERGIES: Allergies  Allergen Reactions   Diphenhydramine Anaphylaxis and Other (See Comments)    Difficulty swallowing  Other Reaction(s): Not available   Diphenhydramine-Phenylephrine Anaphylaxis and Swelling    From IV    Ace Inhibitors Swelling    lips  Other Reaction(s): Not available  Other Reaction(s): edema, Other (See Comments)  Lips swell  lips   Sulfa Antibiotics Hives, Itching and Rash   Triamterene Swelling   Amoxicillin Diarrhea   Hydrochlorothiazide W-Triamterene Swelling   Lisinopril Swelling   Norvasc [Amlodipine Besylate] Swelling   Amlodipine Swelling    Other Reaction(s): other  Other Reaction(s): Other (See Comments), Other (See Comments)  Does not remember   Dilantin [Phenytoin Sodium Extended] Hives and Rash   Phenytoin Hives, Itching and Rash  HOME MEDICATIONS: Outpatient Medications Prior to Visit  Medication Sig Dispense Refill   carvedilol (COREG) 6.25 MG tablet Take 6.25 mg by mouth 2 (two) times daily with a meal.     cetirizine (ZYRTEC) 10 MG tablet Take 1 tablet (10 mg total) by mouth daily. 30 tablet 0   ELIQUIS 5 MG TABS tablet TAKE 1 TABLET BY MOUTH TWICE A DAY 60 tablet 4   Ergocalciferol (VITAMIN D2 PO) Take 1,250 mcg by mouth as directed. ERGOCALCIFEROL (VITAMIN D2) 1,250 MCG (50,000 UNIT) CAPSULE     furosemide (LASIX) 20 MG tablet Take 20 mg by mouth 2 (two) times daily.     levothyroxine (SYNTHROID, LEVOTHROID) 50 MCG tablet Take 50 mcg by mouth daily before breakfast.     ocrelizumab (OCREVUS) 300 MG/10ML injection Inject into the vein.     ocrelizumab 600 mg in sodium chloride 0.9 % 500 mL Inject 600 mg into the vein every 6 (six) months.     prednisoLONE acetate (PRED MILD) 0.12 % ophthalmic suspension Place 1 drop into the right eye as directed.     Saline (SIMPLY SALINE) 0.9 % AERS Place 1 spray into the nose as needed (congestion).     bisacodyl (DULCOLAX) 5 MG EC tablet Take 5 mg by mouth See admin instructions.     chlorhexidine (PERIDEX) 0.12 % solution Use as directed 5 mLs in the mouth or throat as directed.     Cholecalciferol 50 MCG (2000 UT) TABS Take 2,000 Units by mouth as directed.     dexamethasone (DECADRON) 4 MG tablet Take 4 mg  by mouth 3 (three) times daily.     diclofenac (VOLTAREN) 75 MG EC tablet Take 1 tablet by mouth 2 (two) times daily.     Ergocalciferol (VITAMIN D2 PO) Take 1,250 mcg by mouth as directed.     Ergocalciferol 10 MCG (400 UNIT) TABS Take by mouth.     Fingolimod HCl (GILENYA) 0.5 MG CAPS      fluticasone (FLONASE) 50 MCG/ACT nasal spray Place 1 spray into both nostrils daily.     Fluticasone-Umeclidin-Vilant (TRELEGY ELLIPTA) 100-62.5-25 MCG/ACT AEPB Inhale into the lungs.     Fluticasone-Umeclidin-Vilant (TRELEGY ELLIPTA) 200-62.5-25 MCG/ACT AEPB Inhale 1 puff into the lungs daily.     Fluticasone-Umeclidin-Vilant (TRELEGY ELLIPTA) 200-62.5-25 MCG/ACT AEPB Inhale 1 Inhalation into the lungs as directed.     Fluticasone-Umeclidin-Vilant (TRELEGY ELLIPTA) 200-62.5-25 MCG/ACT AEPB Inhale 1 puff into the lungs daily.     guaifenesin (HUMIBID E) 400 MG TABS tablet Take 400 mg by mouth every 4 (four) hours as needed.     HYDROcodone-acetaminophen (NORCO/VICODIN) 5-325 MG tablet Take 1-2 tablets by mouth every 6 (six) hours as needed for severe pain (pain score 7-10) or moderate pain (pain score 4-6).     ibuprofen (ADVIL) 400 MG tablet Take 400 mg by mouth every 4 (four) hours as needed.     ibuprofen (ADVIL) 600 MG tablet Take 600 mg by mouth every 6 (six) hours as needed.     influenza vac split quadrivalent PF (AFLURIA QUADRIVALENT) 0.5 ML injection Inject 0.5 mLs into the muscle once.     Iron, Ferrous Sulfate, 325 (65 Fe) MG TABS Take 2 tablets by mouth daily.     lidocaine (XYLOCAINE) 2 % solution Use as directed 5 mLs in the mouth or throat as directed.     molnupiravir EUA (LAGEVRIO) 200 MG CAPS capsule Take 4 capsules by mouth 2 (two) times daily.     nitrofurantoin, macrocrystal-monohydrate, (  MACROBID) 100 MG capsule Take 1 capsule (100 mg total) by mouth 2 (two) times daily. 10 capsule 0   nortriptyline (PAMELOR) 25 MG capsule Take 25 mg by mouth as directed.     omeprazole (PRILOSEC) 40 MG  capsule Take 40 mg by mouth daily.     ondansetron (ZOFRAN) 4 MG tablet Take 4 mg by mouth every 8 (eight) hours as needed.     Oxcarbazepine (TRILEPTAL) 300 MG tablet Take 300 mg by mouth as directed.     phenazopyridine (PYRIDIUM) 200 MG tablet Take 200 mg by mouth 3 (three) times daily as needed for pain.     polyethylene glycol-electrolytes (NULYTELY) 420 g solution Take 4,000 mLs by mouth See admin instructions.     prednisoLONE acetate (PRED FORTE) 1 % ophthalmic suspension Place 1 drop into both eyes 2 (two) times daily.     predniSONE (STERAPRED UNI-PAK 21 TAB) 5 MG (21) TBPK tablet Take 5 mg by mouth as directed.     predniSONE (STERAPRED UNI-PAK 48 TAB) 5 MG (48) TBPK tablet Take 5 mg by mouth as directed.     temazepam (RESTORIL) 15 MG capsule Take 15 mg by mouth as directed.     timolol (TIMOPTIC) 0.5 % ophthalmic solution Place 1 drop into the right eye daily at 6 (six) AM.     tiZANidine (ZANAFLEX) 4 MG tablet Take 1 tablet by mouth every 6 (six) hours as needed.     traZODone (DESYREL) 50 MG tablet Take 50 mg by mouth at bedtime.     triamcinolone cream (KENALOG) 0.1 % Apply 1 Application topically 2 (two) times daily.     TRIAMTERENE PO Take by mouth.     triamterene-hydrochlorothiazide (MAXZIDE-25) 37.5-25 MG tablet Take 1 tablet by mouth daily.     UNABLE TO FIND NORTREL 7/7/7 (28) 0.5 MG/0.75 MG/1 MG-35 MCG TABLET TAKE 1 TABLET BY MOUTH ONCE DAILY     valACYclovir (VALTREX) 1000 MG tablet Take 1 tablet by mouth daily.     valACYclovir (VALTREX) 500 MG tablet Take 1,000 mg by mouth daily.     zolpidem (AMBIEN) 5 MG tablet Take 5 mg by mouth as directed.     Zoster Vaccine Live, PF, (ZOSTAVAX) 40981 UNT/0.65ML injection Inject 0.65 mLs into the skin See admin instructions.     No facility-administered medications prior to visit.     PAST MEDICAL HISTORY: Past Medical History:  Diagnosis Date   Anemia    Asthma    Hypertension    Hypothyroidism    Multiple sclerosis  (HCC)    Neuromuscular disorder (HCC)    Pre-diabetes    Pulmonary embolism (HCC)    Vertigo    Vision abnormalities      PAST SURGICAL HISTORY: Past Surgical History:  Procedure Laterality Date   COLONOSCOPY  2023   EYE SURGERY  2017   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   Procedure: LEFT TOTAL KNEE ARTHROPLASTY;  Surgeon: Marcene Corning, MD;  Location: [facility];  Service: Orthopedics;  Laterality: Left;     FAMILY HISTORY: Family History  Problem Relation Age of Onset   Hyperlipidemia Mother    Hypertension Mother    Stroke Father    Diabetes Father      SOCIAL HISTORY: Social History   Socioeconomic History   Marital status: Married    Spouse name: Not on file   Number of children: Not on file   Years of education: Not on file   Highest education level: Not  on file  Occupational History   Not on file  Tobacco Use   Smoking status: Never   Smokeless tobacco: Never  Vaping Use   Vaping status: Never Used  Substance and Sexual Activity   Alcohol use: Never    Alcohol/week: 0.0 standard drinks of alcohol   Drug use: Never   Sexual activity: Not Currently  Other Topics Concern   Not on file  Social History Narrative   Not on file   Social Drivers of Health   Financial Resource Strain: Not on file  Food Insecurity: Not on file  Transportation Needs: Not on file  Physical Activity: Not on file  Stress: Not on file  Social Connections: Not on file  Intimate Partner Violence: Not on file     PHYSICAL EXAM  Vitals:   01/13/24 1248  BP: 129/75  Pulse: 79  Weight: 162 lb (73.5 kg)  Height: 5\' 3"  (1.6 m)      Body mass index is 28.7 kg/m.   Generalized: Well developed, in no acute distress  Cardiology: normal rate and rhythm, no murmur auscultated  Respiratory: clear to auscultation bilaterally    Neurological examination  Mentation: Alert oriented to time, place, history taking. Follows all commands speech and language fluent Cranial  nerve II-XII: Pupils were equal round reactive to light. Extraocular movements were full, visual field were full on confrontational test. Facial sensation and strength were normal. Head turning and shoulder shrug  were normal and symmetric. Motor: The motor testing reveals 5 over 5 strength of all 4 extremities with exception of 4/5 right foot.  Sensory: Sensory testing is intact to soft touch on all 4 extremities. No evidence of extinction is noted.  Gait and station: Station normal. Gait is slightly wide. Tandem is unsteady.    DIAGNOSTIC DATA (LABS, IMAGING, TESTING) - I reviewed patient records, labs, notes, testing and imaging myself where available.  Lab Results  Component Value Date   WBC 3.7 (L) 11/10/2023   HGB 13.1 11/10/2023   HCT 40.4 11/10/2023   MCV 82.8 11/10/2023   PLT 285 11/10/2023      Component Value Date/Time   NA 135 11/10/2023 1625   NA 144 06/21/2022 1116   K 4.0 11/10/2023 1625   CL 102 11/10/2023 1625   CO2 24 11/10/2023 1625   GLUCOSE 100 (H) 11/10/2023 1625   BUN 14 11/10/2023 1625   BUN 17 06/21/2022 1116   CREATININE 0.92 11/10/2023 1625   CALCIUM 9.4 11/10/2023 1625   PROT 5.8 (L) 06/21/2022 1116   ALBUMIN 4.0 06/21/2022 1116   AST 14 06/21/2022 1116   ALT 11 06/21/2022 1116   ALKPHOS 146 (H) 06/21/2022 1116   BILITOT 0.3 06/21/2022 1116   GFRNONAA >60 11/10/2023 1625   GFRAA 70 07/11/2020 1343   No results found for: "CHOL", "HDL", "LDLCALC", "LDLDIRECT", "TRIG", "CHOLHDL" Lab Results  Component Value Date   HGBA1C 5.8 (H) 07/11/2020   No results found for: "VITAMINB12" Lab Results  Component Value Date   TSH 0.989 01/28/2023        No data to display               No data to display           ASSESSMENT AND PLAN  58 y.o. year old female  has a past medical history of Anemia, Asthma, Hypertension, Hypothyroidism, Multiple sclerosis (HCC), Neuromuscular disorder (HCC), Pre-diabetes, Pulmonary embolism (HCC), Vertigo,  and Vision abnormalities. here with    Relapsing  remitting multiple sclerosis (HCC) - Plan: IgG, IgA, IgM, CBC with Differential/Platelets  High risk medication use - Plan: IgG, IgA, IgM, CBC with Differential/Platelets  Insomnia, unspecified type  Urge incontinence of urine  Gait disturbance  Paresthesias  Vitamin D deficiency - Plan: Vitamin D, 25-hydroxy  Shere feels that MS symptoms are stable, overall. She will continue Ocrevus infusions every 6 months. We will update labs, today. Will likely update MRI at end of 2025. Adequate hydration and regular activity encouraged.  She will continue focusing on healthy lifestyle habits.  Regular physical and mental activity advised. She will follow up with Dr Epimenio Foot in 6 months.    Orders Placed This Encounter  Procedures   IgG, IgA, IgM   CBC with Differential/Platelets   Vitamin D, 25-hydroxy      No orders of the defined types were placed in this encounter.    Shawnie Dapper, MSN, FNP-C 01/13/2024, 2:24 PM  [facility] [address] [place], [place] [address] [phone]

## 2024-01-09 NOTE — Patient Instructions (Signed)
 Below is our plan:  We will continue current treatment plan. I will update labs. Let me know if you need me!  Please make sure you are staying well hydrated. I recommend 50-60 ounces daily. Well balanced diet and regular exercise encouraged. Consistent sleep schedule with 6-8 hours recommended.   Please continue follow up with care team as directed.   Follow up with Dr Epimenio Foot in 6 months   You may receive a survey regarding today's visit. I encourage you to leave honest feed back as I do use this information to improve patient care. Thank you for seeing me today!

## 2024-01-13 ENCOUNTER — Encounter: Payer: Self-pay | Admitting: Family Medicine

## 2024-01-13 ENCOUNTER — Ambulatory Visit: Payer: Medicare Other | Admitting: Family Medicine

## 2024-01-13 VITALS — BP 129/75 | HR 79 | Ht 63.0 in | Wt 162.0 lb

## 2024-01-13 DIAGNOSIS — Z79899 Other long term (current) drug therapy: Secondary | ICD-10-CM | POA: Diagnosis not present

## 2024-01-13 DIAGNOSIS — G4700 Insomnia, unspecified: Secondary | ICD-10-CM | POA: Diagnosis not present

## 2024-01-13 DIAGNOSIS — N3941 Urge incontinence: Secondary | ICD-10-CM

## 2024-01-13 DIAGNOSIS — R269 Unspecified abnormalities of gait and mobility: Secondary | ICD-10-CM

## 2024-01-13 DIAGNOSIS — E559 Vitamin D deficiency, unspecified: Secondary | ICD-10-CM

## 2024-01-13 DIAGNOSIS — G35 Multiple sclerosis: Secondary | ICD-10-CM | POA: Diagnosis not present

## 2024-01-13 DIAGNOSIS — R202 Paresthesia of skin: Secondary | ICD-10-CM

## 2024-01-14 ENCOUNTER — Encounter: Payer: Self-pay | Admitting: Family Medicine

## 2024-01-14 LAB — CBC WITH DIFFERENTIAL/PLATELET
Basophils Absolute: 0 10*3/uL (ref 0.0–0.2)
Basos: 1 %
EOS (ABSOLUTE): 0.1 10*3/uL (ref 0.0–0.4)
Eos: 3 %
Hematocrit: 39.7 % (ref 34.0–46.6)
Hemoglobin: 12.7 g/dL (ref 11.1–15.9)
Immature Grans (Abs): 0 10*3/uL (ref 0.0–0.1)
Immature Granulocytes: 0 %
Lymphocytes Absolute: 1.6 10*3/uL (ref 0.7–3.1)
Lymphs: 37 %
MCH: 26.3 pg — ABNORMAL LOW (ref 26.6–33.0)
MCHC: 32 g/dL (ref 31.5–35.7)
MCV: 82 fL (ref 79–97)
Monocytes Absolute: 0.3 10*3/uL (ref 0.1–0.9)
Monocytes: 7 %
Neutrophils Absolute: 2.3 10*3/uL (ref 1.4–7.0)
Neutrophils: 52 %
Platelets: 319 10*3/uL (ref 150–450)
RBC: 4.82 x10E6/uL (ref 3.77–5.28)
RDW: 14.7 % (ref 11.7–15.4)
WBC: 4.3 10*3/uL (ref 3.4–10.8)

## 2024-01-14 LAB — IGG, IGA, IGM
IgA/Immunoglobulin A, Serum: 53 mg/dL — ABNORMAL LOW (ref 87–352)
IgG (Immunoglobin G), Serum: 660 mg/dL (ref 586–1602)
IgM (Immunoglobulin M), Srm: 59 mg/dL (ref 26–217)

## 2024-01-14 LAB — VITAMIN D 25 HYDROXY (VIT D DEFICIENCY, FRACTURES): Vit D, 25-Hydroxy: 30.8 ng/mL (ref 30.0–100.0)

## 2024-01-15 LAB — LAB REPORT - SCANNED: EGFR: 63

## 2024-01-23 ENCOUNTER — Ambulatory Visit
Admission: RE | Admit: 2024-01-23 | Discharge: 2024-01-23 | Disposition: A | Discharge status: Home / Self Care | Source: Ambulatory Visit | Attending: Orthopaedic Surgery | Admitting: Orthopaedic Surgery

## 2024-01-23 DIAGNOSIS — M25561 Pain in right knee: Secondary | ICD-10-CM

## 2024-01-29 ENCOUNTER — Ambulatory Visit: Payer: Medicare Other | Attending: Cardiology | Admitting: Cardiology

## 2024-01-29 VITALS — BP 142/92 | HR 69 | Resp 16 | Ht 63.0 in | Wt 162.0 lb

## 2024-01-29 DIAGNOSIS — R072 Precordial pain: Secondary | ICD-10-CM

## 2024-01-29 DIAGNOSIS — E7800 Pure hypercholesterolemia, unspecified: Secondary | ICD-10-CM | POA: Diagnosis not present

## 2024-01-29 DIAGNOSIS — G35 Multiple sclerosis: Secondary | ICD-10-CM

## 2024-01-29 DIAGNOSIS — I10 Essential (primary) hypertension: Secondary | ICD-10-CM | POA: Diagnosis not present

## 2024-01-29 DIAGNOSIS — Z86711 Personal history of pulmonary embolism: Secondary | ICD-10-CM | POA: Diagnosis not present

## 2024-01-29 NOTE — Patient Instructions (Addendum)
 Medication Instructions:  Your physician recommends that you continue on your current medications as directed. Please refer to the Current Medication list given to you today.  *If you need a refill on your cardiac medications before your next appointment, please call your pharmacy*  Lab Work: BMP IN 1 WEEK If you have labs (blood work) drawn today and your tests are completely normal, you will receive your results only by: MyChart Message (if you have MyChart) OR A paper copy in the mail If you have any lab test that is abnormal or we need to change your treatment, we will call you to review the results.  Testing/Procedures: Your physician has requested that you have a coronary CTA. Non-Cardiac CT Angiography (CTA), is a special type of CT scan that uses a computer to produce multi-dimensional views of major blood vessels throughout the body. In CT angiography, a contrast material is injected through an IV to help visualize the blood vessels  Your physician has requested that you have an echocardiogram. Echocardiography is a painless test that uses sound waves to create images of your heart. It provides your doctor with information about the size and shape of your heart and how well your heart's chambers and valves are working. This procedure takes approximately one hour. There are no restrictions for this procedure. Please do NOT wear cologne, perfume, aftershave, or lotions (deodorant is allowed). Please arrive 15 minutes prior to your appointment time.  Please note: We ask at that you not bring children with you during ultrasound (echo/ vascular) testing. Due to room size and safety concerns, children are not allowed in the ultrasound rooms during exams. Our front office staff cannot provide observation of children in our lobby area while testing is being conducted. An adult accompanying a patient to their appointment will only be allowed in the ultrasound room at the discretion of the  ultrasound technician under special circumstances. We apologize for any inconvenience.   Follow-Up: At [facility], you and your health needs are our priority.  As part of our continuing mission to provide you with exceptional heart care, we have created designated Provider Care Teams.  These Care Teams include your primary Cardiologist (physician) and Advanced Practice Providers (APPs -  Physician Assistants and Nurse Practitioners) who all work together to provide you with the care you need, when you need it.  We recommend signing up for the patient portal called "MyChart".  Sign up information is provided on this After Visit Summary.  MyChart is used to connect with patients for Virtual Visits (Telemedicine).  Patients are able to view lab/test results, encounter notes, upcoming appointments, etc.  Non-urgent messages can be sent to your provider as well.   To learn more about what you can do with MyChart, go to [email or web address].    Your next appointment:   6 month(s)  The format for your next appointment:   In Person  Provider:   Olinda Bertrand, [facility]  Other Instructions Please speak with your primary care provider about blood pressure and cholesterol management.  -----------------------------------------------------------------------------------------    Your cardiac CT will be scheduled at one of the below locations:   [facility] [address] [place], [place] [address] [phone]  If scheduled at [facility], please arrive at the [facility] and Children's Entrance (Entrance C2) of [facility] 30 minutes prior to test start time. You can use the FREE valet parking offered at entrance C (encouraged to control the heart rate for the test)  Proceed to the [facility] Radiology Department (first floor) to check-in and test prep.  All radiology patients and guests should use entrance C2 at [facility], accessed from [facility],  even though the hospital's physical address listed is [address]     Please follow these instructions carefully (unless otherwise directed):  An IV will be required for this test and Nitroglycerin will be given.  Hold all erectile dysfunction medications at least 3 days (72 hrs) prior to test. (Ie viagra, cialis, sildenafil, tadalafil, etc)   On the Night Before the Test: Be sure to Drink plenty of water. Do not consume any caffeinated/decaffeinated beverages or chocolate 12 hours prior to your test. Do not take any antihistamines 12 hours prior to your test.  On the Day of the Test: Drink plenty of water until 1 hour prior to the test. Do not eat any food 1 hour prior to test. You may take your regular medications prior to the test.  Take Carvedilol 12.5 mg (2 tablets) two hours prior to test. If you take Furosemide/Hydrochlorothiazide/Spironolactone, please HOLD on the morning of the test. FEMALES- please wear underwire-free bra if available, avoid dresses & tight clothing      After the Test: Drink plenty of water. After receiving IV contrast, you may experience a mild flushed feeling. This is normal. On occasion, you may experience a mild rash up to 24 hours after the test. This is not dangerous. If this occurs, you can take Benadryl  25 mg and increase your fluid intake. If you experience trouble breathing, this can be serious. If it is severe call 911 IMMEDIATELY. If it is mild, please call our office. If you take any of these medications: Glipizide/Metformin, Avandament, Glucavance, please do not take 48 hours after completing test unless otherwise instructed.  We will call to schedule your test 2-4 weeks out understanding that some insurance companies will need an authorization prior to the service being performed.   For more information and frequently asked questions, please visit our website : [email or web address]  For non-scheduling related  questions, please contact the cardiac imaging nurse navigator should you have any questions/concerns: Cardiac Imaging Nurse Navigators Direct Office Dial: [phone]   For scheduling needs, including cancellations and rescheduling, please call [place], [phone]. ----------------------------------------------------------------------------------   1st Floor: - Lobby - Registration  - Pharmacy  - Lab - Cafe  2nd Floor: - PV Lab - Diagnostic Testing (echo, CT, nuclear med)  3rd Floor: - Vacant  4th Floor: - TCTS (cardiothoracic surgery) - AFib Clinic - Structural Heart Clinic - Vascular Surgery  - Vascular Ultrasound  5th Floor: - HeartCare Cardiology (general and EP) - Clinical Pharmacy for coumadin, hypertension, lipid, weight-loss medications, and med management appointments    Valet parking services will be available as well.

## 2024-01-29 NOTE — Progress Notes (Unsigned)
 Cardiology Office Note:    NAME:  Pamela Potter    MRN: [id number] DOB:  1966-05-26   PCP:  Roselind Congo, MD  Former Cardiology Providers: None Primary Cardiologist:  Olinda Bertrand, DO, [facility] (established care 01/29/2024) Electrophysiologist:  None   Referring MD: Roselind Congo, MD  Reason of Consult: Chest Pain  Chief Complaint  Patient presents with   Chest Pain   New Patient (Initial Visit)    History of Present Illness:    Pamela Potter is a 58 y.o. African-American female whose past medical history and cardiovascular risk factors includes: Multiple Sclerosis (disease-modifying therapy), Htn, hypothyroidism, hx of PE (11/2022 per patient). She is being seen today for the evaluation of Chest Pain at the request of Roselind Congo, MD.  He had a ED visit for chest pain back in February 2022 is referred to cardiology for further evaluation and care.  During her ED visit her high-sensitivity troponins were negative x 2 and EKG Ellis rate of sinus rhythm without myocardial injury pattern of STEMI or ischemic pattern.  Patient had had a CT chest PE protocol in April 2024 which notes no coronary calcification.  Chest pain / discomfort  Substernally located. Describes it as a heaviness like sensation. Intensity 5 out of 10. Radiates to the left arm. Worse with eating or drinking. Improves with resting. Self-limited. Not brought on by effort related activities Occurs at least once a week. Went to the ED in February 2025. Labs from February 202 noted high sensitive troponins negative x 2  No structured exercise program or daily routine.  No first degree relatives with premature coronary disease or sudden cardiac death.   Current Medications: Current Meds  Medication Sig   carvedilol (COREG) 6.25 MG tablet Take 6.25 mg by mouth 2 (two) times daily with a meal.   cetirizine  (ZYRTEC ) 10 MG tablet Take 1 tablet (10 mg total) by mouth daily.   Cholecalciferol 50 MCG  (2000 UT) TABS Take 2,000 Units by mouth as directed.   ELIQUIS  5 MG TABS tablet TAKE 1 TABLET BY MOUTH TWICE A DAY   Ergocalciferol  (VITAMIN D2 PO) Take 1,250 mcg by mouth as directed. ERGOCALCIFEROL  (VITAMIN D2) 1,250 MCG (50,000 UNIT) CAPSULE   furosemide (LASIX) 20 MG tablet Take 20 mg by mouth 2 (two) times daily.   levothyroxine (SYNTHROID, LEVOTHROID) 50 MCG tablet Take 50 mcg by mouth daily before breakfast.   ocrelizumab  (OCREVUS ) 300 MG/10ML injection Inject into the vein.   OCRELIZUMAB  (OCREVUS ) 600 MG INFUSION Inject 600 mg into the vein every 6 (six) months.   prednisoLONE acetate (PRED MILD) 0.12 % ophthalmic suspension Place 1 drop into the right eye as directed.   Saline (SIMPLY SALINE) 0.9 % AERS Place 1 spray into the nose as needed (congestion).     Allergies:    Diphenhydramine , Diphenhydramine -phenylephrine , Ace inhibitors, Sulfa antibiotics, Triamterene, Amoxicillin, Hydrochlorothiazide-triamterene, Lisinopril, Norvasc [amlodipine besylate], Amlodipine, Dilantin [phenytoin sodium extended], and Phenytoin   Past Medical History: Past Medical History:  Diagnosis Date   Anemia    Asthma    Hypertension    Hypothyroidism    Multiple sclerosis (HCC)    Neuromuscular disorder (HCC)    Pre-diabetes    Pulmonary embolism (HCC)    Vertigo    Vision abnormalities     Past Surgical History: Past Surgical History:  Procedure Laterality Date   COLONOSCOPY  2023   EYE SURGERY  2017   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  Procedure: LEFT TOTAL KNEE ARTHROPLASTY;  Surgeon: Dayne Even, MD;  Location: [facility];  Service: Orthopedics;  Laterality: Left;    Social History: Social History   Tobacco Use   Smoking status: Never   Smokeless tobacco: Never  Vaping Use   Vaping status: Never Used  Substance Use Topics   Alcohol use: Never    Alcohol/week: 0.0 standard drinks of alcohol   Drug use: Never    Family History: Family History  Problem Relation Age  of Onset   Hyperlipidemia Mother    Hypertension Mother    Stroke Father    Diabetes Father     ROS:   Review of Systems  Cardiovascular:  Negative for chest pain, claudication, irregular heartbeat, leg swelling, near-syncope, orthopnea, palpitations, paroxysmal nocturnal dyspnea and syncope.  Respiratory:  Negative for shortness of breath.   Hematologic/Lymphatic: Negative for bleeding problem.    EKGs/Labs/Other Studies Reviewed:   EKG: EKG Interpretation Date/Time:  Wednesday January 29 2024 10:53:36 EDT Ventricular Rate:  71 PR Interval:  150 QRS Duration:  66 QT Interval:  368 QTC Calculation: 399 R Axis:   28  Text Interpretation: Normal sinus rhythm Cannot rule out Anterior infarct (cited on or before 10-Nov-2023) When compared with ECG of 10-Nov-2023 16:19, No significant change since last tracing Confirmed by Olinda Bertrand [phone]) on 01/29/2024 10:59:57 AM  Echocardiogram: None  Labs:    Latest Ref Rng & Units 01/13/2024    1:18 PM 11/10/2023    4:25 PM 07/08/2023    1:23 PM  CBC  WBC 3.4 - 10.8 x10E3/uL 4.3  3.7  3.4   Hemoglobin 11.1 - 15.9 g/dL 60.4  54.0  98.1   Hematocrit 34.0 - 46.6 % 39.7  40.4  42.3   Platelets 150 - 450 x10E3/uL 319  285  288        Latest Ref Rng & Units 11/10/2023    4:25 PM 01/28/2023    1:40 PM 07/11/2022   11:14 AM  BMP  Glucose 70 - 99 mg/dL 191  92  99   BUN 6 - 20 mg/dL 14  11  17    Creatinine 0.44 - 1.00 mg/dL 4.78  2.95  6.21   Sodium 135 - 145 mmol/L 135  137  138   Potassium 3.5 - 5.1 mmol/L 4.0  3.7  4.5   Chloride 98 - 111 mmol/L 102  103  104   CO2 22 - 32 mmol/L 24  25  28    Calcium 8.9 - 10.3 mg/dL 9.4  9.3  9.6       Latest Ref Rng & Units 11/10/2023    4:25 PM 01/28/2023    1:40 PM 07/11/2022   11:14 AM  CMP  Glucose 70 - 99 mg/dL 308  92  99   BUN 6 - 20 mg/dL 14  11  17    Creatinine 0.44 - 1.00 mg/dL 6.57  8.46  9.62   Sodium 135 - 145 mmol/L 135  137  138   Potassium 3.5 - 5.1 mmol/L 4.0  3.7  4.5   Chloride  98 - 111 mmol/L 102  103  104   CO2 22 - 32 mmol/L 24  25  28    Calcium 8.9 - 10.3 mg/dL 9.4  9.3  9.6     No results found for: "CHOL", "HDL", "LDLCALC", "LDLDIRECT", "TRIG", "CHOLHDL" No results for input(s): "LIPOA" in the last 8760 hours. No components found for: "NTPROBNP" No results for input(s): "PROBNP" in the  last 8760 hours. No results for input(s): "TSH" in the last 8760 hours.  External Labs: Collected: November 25, 2023 provided by primary team. BUN 15, creatinine 1.02. Sodium 140, potassium 4.3, chloride 101, bicarb 29. AST 25, ALT 19 Alkaline phosphatase 131 (slightly above normal limits) TSH 1.27  Collected: December 06, 2023. Hemoglobin 12.8, hematocrit 40%  Collected: January 02, 2023 provided by primary team. Total cholesterol 220, triglycerides 75, HDL 60, LDL 147, non-HDL 160  Collected: January 15, 2024 provided by primary team Total cholesterol 224, triglycerides 68, HDL 72, LDL calculated 140, non-HDL 152  Physical Exam:    Today's Vitals   01/29/24 1042  BP: (!) 142/92  Pulse: 69  Resp: 16  SpO2: 96%  Weight: 162 lb (73.5 kg)  Height: 5\' 3"  (1.6 m)   Body mass index is 28.7 kg/m. Wt Readings from Last 3 Encounters:  01/29/24 162 lb (73.5 kg)  01/13/24 162 lb (73.5 kg)  11/10/23 160 lb (72.6 kg)    Physical Exam  Constitutional: No distress.  hemodynamically stable  Neck: No JVD present.  Cardiovascular: Normal rate, regular rhythm, S1 normal and S2 normal. Exam reveals no gallop, no S3 and no S4.  No murmur heard. Pulses:      Dorsalis pedis pulses are 2+ on the right side and 2+ on the left side.       Posterior tibial pulses are 2+ on the right side and 2+ on the left side.  Pulmonary/Chest: Effort normal and breath sounds normal. No stridor. She has no wheezes. She has no rales.  Musculoskeletal:        General: No edema.     Cervical back: Neck supple.  Skin: Skin is warm.     Impression & Recommendation(s):  Impression:    ICD-10-CM   1. Precordial pain  R07.2 EKG 12-Lead    ECHOCARDIOGRAM COMPLETE    CT CORONARY MORPH W/CTA COR W/SCORE W/CA W/CM &/OR WO/CM    Basic metabolic panel with GFR    Lab report - scanned    2. Pure hypercholesterolemia  E78.00     3. Benign hypertension  I10     4. Hx pulmonary embolism  Z86.711     5. Multiple sclerosis (HCC)  G35        Recommendation(s):  Precordial pain Symptoms predominantly noncardiac. Multiple cardiovascular risk factors as mentioned above. EKG shows sinus rhythm without underlying injury pattern or ischemia. Has had ER visits for similar discomfort in the past. 10-year risk of ASCVD approximately 8.1%  Echo will be ordered to evaluate for structural heart disease and left ventricular systolic function. Coronary CTA to evaluate for obstructive CAD, plaque, and coronary calcium score Recommend taking 2 tablets of carvedilol in the morning of the coronary CTA for better rate control Further recommendations to follow  Pure hypercholesterolemia Total cholesterol 224, LDL 140 Her LDL levels have remained high for the last several years. Her 10-year risk of ASCVD is 8.1% Would benefit from moderate to high intensity statin therapy. Patient states that she had a similar discussion with PCP who recommended holding off on pharmacological therapy.  She would like to discuss this further with PCP prior to initiating therapy.  Benign hypertension Office blood pressures are not well-controlled. Home blood pressures usually range between 135-140 mmHg. Continue to monitor ambulatory blood pressure readings and discussed plan of care with primary team.  May be beneficial to add losartan or diuretic.  Recommended goal SBP approximately 130 mmHg on average  Hx  pulmonary embolism Currently on anticoagulation managed by other providers of the care team  Multiple sclerosis [facility]) Currently on medical therapy. Managed by other providers the care team  Orders  Placed:  Orders Placed This Encounter  Procedures   CT CORONARY MORPH W/CTA COR W/SCORE W/CA W/CM &/OR WO/CM    Standing Status:   Future    Expiration Date:   01/28/2025    If indicated for the ordered procedure, I authorize the administration of contrast media per Radiology protocol:   Yes    Initiate Coronary CTA Adult Protocol:   Yes    If indicated initiate Post Coronary CTA Hypotension Adult Protocol:   Yes    Does the patient have a contrast media/X-ray dye allergy?:   No    Is patient pregnant?:   No    Preferred Imaging Location?:   [facility]    Authorization::   FFR will be ordered if deemed medically necessary   Basic metabolic panel with GFR   Lab report - scanned   EKG 12-Lead   ECHOCARDIOGRAM COMPLETE    Standing Status:   Future    Expected Date:   02/05/2024    Expiration Date:   01/28/2025    Where should this test be performed:   [facility])    Does the patient weigh less than or greater than 250 lbs?:   Patient weighs less than 250 lbs    Perflutren DEFINITY (image enhancing agent) should be administered unless hypersensitivity or allergy exist:   Administer Perflutren    Reason for exam-Echo:   Other-Full Diagnosis List    Full ICD-10/Reason for Exam:   Precordial pain [786.51.ICD-9-CM]     Final Medication List:   No orders of the defined types were placed in this encounter.   There are no discontinued medications.   Current Outpatient Medications:    carvedilol (COREG) 6.25 MG tablet, Take 6.25 mg by mouth 2 (two) times daily with a meal., Disp: , Rfl:    cetirizine  (ZYRTEC ) 10 MG tablet, Take 1 tablet (10 mg total) by mouth daily., Disp: 30 tablet, Rfl: 0   Cholecalciferol 50 MCG (2000 UT) TABS, Take 2,000 Units by mouth as directed., Disp: , Rfl:    ELIQUIS  5 MG TABS tablet, TAKE 1 TABLET BY MOUTH TWICE A DAY, Disp: 60 tablet, Rfl: 4   Ergocalciferol  (VITAMIN D2 PO), Take 1,250 mcg by mouth as directed. ERGOCALCIFEROL   (VITAMIN D2) 1,250 MCG (50,000 UNIT) CAPSULE, Disp: , Rfl:    furosemide (LASIX) 20 MG tablet, Take 20 mg by mouth 2 (two) times daily., Disp: , Rfl:    levothyroxine (SYNTHROID, LEVOTHROID) 50 MCG tablet, Take 50 mcg by mouth daily before breakfast., Disp: , Rfl:    ocrelizumab  (OCREVUS ) 300 MG/10ML injection, Inject into the vein., Disp: , Rfl:    OCRELIZUMAB  (OCREVUS ) 600 MG INFUSION, Inject 600 mg into the vein every 6 (six) months., Disp: , Rfl:    prednisoLONE acetate (PRED MILD) 0.12 % ophthalmic suspension, Place 1 drop into the right eye as directed., Disp: , Rfl:    Saline (SIMPLY SALINE) 0.9 % AERS, Place 1 spray into the nose as needed (congestion)., Disp: , Rfl:   Consent:   NA  Disposition:   6 month follow up  Patient may be asked to follow-up sooner based on the results of the above-mentioned testing.  Her questions and concerns were addressed to her satisfaction. She voices understanding of the recommendations provided during this  encounter.    Signed, Olinda Bertrand, DO, [facility] [place]  [facility]  [address] [place], [place] [address] 01/31/2024 11:26 AM

## 2024-01-31 ENCOUNTER — Encounter: Payer: Self-pay | Admitting: Cardiology

## 2024-02-07 LAB — BASIC METABOLIC PANEL WITH GFR
BUN/Creatinine Ratio: 15 (ref 9–23)
BUN: 14 mg/dL (ref 6–24)
CO2: 23 mmol/L (ref 20–29)
Calcium: 9.2 mg/dL (ref 8.7–10.2)
Chloride: 104 mmol/L (ref 96–106)
Creatinine, Ser: 0.94 mg/dL (ref 0.57–1.00)
Glucose: 78 mg/dL (ref 70–99)
Potassium: 4.5 mmol/L (ref 3.5–5.2)
Sodium: 138 mmol/L (ref 134–144)
eGFR: 70 mL/min/{1.73_m2} (ref >59)

## 2024-02-11 ENCOUNTER — Encounter (HOSPITAL_COMMUNITY): Payer: Self-pay

## 2024-02-12 ENCOUNTER — Ambulatory Visit (HOSPITAL_COMMUNITY)
Admission: RE | Admit: 2024-02-12 | Discharge: 2024-02-12 | Disposition: A | Discharge status: Home / Self Care | Source: Ambulatory Visit | Attending: Cardiology | Admitting: Cardiology

## 2024-02-12 DIAGNOSIS — R072 Precordial pain: Secondary | ICD-10-CM | POA: Diagnosis present

## 2024-02-12 MED ORDER — NITROGLYCERIN 0.4 MG SL SUBL
SUBLINGUAL_TABLET | SUBLINGUAL | Status: AC
  Filled 2024-02-12: qty 2

## 2024-02-12 MED ORDER — METOPROLOL TARTRATE 5 MG/5ML IV SOLN: 10.0000 mg | INTRAVENOUS | Status: DC | PRN

## 2024-02-12 MED ORDER — NITROGLYCERIN 0.4 MG SL SUBL
0.8000 mg | SUBLINGUAL_TABLET | Freq: Once | SUBLINGUAL | Status: AC
  Administered 2024-02-12: 0.8 mg via SUBLINGUAL

## 2024-02-12 MED ORDER — DILTIAZEM HCL 25 MG/5ML IV SOLN: 10.0000 mg | INTRAVENOUS | Status: DC | PRN

## 2024-02-12 MED ORDER — IOHEXOL 350 MG/ML SOLN
100.0000 mL | Freq: Once | INTRAVENOUS | Status: AC | PRN
  Administered 2024-02-12: 100 mL via INTRAVENOUS

## 2024-02-13 ENCOUNTER — Encounter: Payer: Self-pay | Admitting: Cardiology

## 2024-02-23 ENCOUNTER — Ambulatory Visit: Payer: Self-pay | Admitting: Cardiology

## 2024-02-29 ENCOUNTER — Ambulatory Visit
Admission: RE | Admit: 2024-02-29 | Discharge: 2024-02-29 | Disposition: A | Discharge status: Home / Self Care | Source: Ambulatory Visit | Attending: Family Medicine

## 2024-02-29 VITALS — BP 158/88 | HR 79 | Temp 97.8°F | Resp 20

## 2024-02-29 DIAGNOSIS — K529 Noninfective gastroenteritis and colitis, unspecified: Secondary | ICD-10-CM | POA: Diagnosis not present

## 2024-02-29 MED ORDER — AZITHROMYCIN 250 MG PO TABS: ORAL_TABLET | ORAL | 0 refills | Status: DC

## 2024-02-29 MED ORDER — LOPERAMIDE HCL 2 MG PO CAPS: 2.0000 mg | ORAL_CAPSULE | Freq: Two times a day (BID) | ORAL | 0 refills | Status: DC | PRN

## 2024-02-29 NOTE — ED Provider Notes (Signed)
 [facility]  Note:  This document was prepared using Conservation officer, historic buildings and may include unintentional dictation errors.  MRN: [id number] DOB: [birth year]  Subjective:   Pamela Potter is a 58 y.o. female presenting for 3-week history of persistent watery loose diarrhea and abdominal cramping.  Symptoms started after she ate at a restaurant, had something from the salad bar.  Denies fever, nausea, vomiting but she has had decreased appetite as a result of her diarrhea.  No bloody stools.  No fever, recent antibiotic use, hospitalizations or long distance travel.  Has not eaten raw foods, drank unfiltered water.  No history of GI disorders including Crohn's, IBS, ulcerative colitis.  No rashes.  No current facility-administered medications for this encounter.  Current Outpatient Medications:    carvedilol (COREG) 6.25 MG tablet, Take 6.25 mg by mouth 2 (two) times daily with a meal., Disp: , Rfl:    cetirizine  (ZYRTEC ) 10 MG tablet, Take 1 tablet (10 mg total) by mouth daily., Disp: 30 tablet, Rfl: 0   Cholecalciferol 50 MCG (2000 UT) TABS, Take 2,000 Units by mouth as directed., Disp: , Rfl:    ELIQUIS  5 MG TABS tablet, TAKE 1 TABLET BY MOUTH TWICE A DAY, Disp: 60 tablet, Rfl: 4   Ergocalciferol  (VITAMIN D2 PO), Take 1,250 mcg by mouth as directed. ERGOCALCIFEROL  (VITAMIN D2) 1,250 MCG (50,000 UNIT) CAPSULE, Disp: , Rfl:    furosemide (LASIX) 20 MG tablet, Take 20 mg by mouth 2 (two) times daily., Disp: , Rfl:    levothyroxine (SYNTHROID, LEVOTHROID) 50 MCG tablet, Take 50 mcg by mouth daily before breakfast., Disp: , Rfl:    ocrelizumab  (OCREVUS ) 300 MG/10ML injection, Inject into the vein., Disp: , Rfl:    OCRELIZUMAB  (OCREVUS ) 600 MG INFUSION, Inject 600 mg into the vein every 6 (six) months., Disp: , Rfl:    prednisoLONE acetate (PRED MILD) 0.12 % ophthalmic suspension, Place 1 drop into the right eye as directed., Disp: , Rfl:    Saline (SIMPLY  SALINE) 0.9 % AERS, Place 1 spray into the nose as needed (congestion)., Disp: , Rfl:    Allergies  Allergen Reactions   Diphenhydramine  Anaphylaxis and Other (See Comments)    Difficulty swallowing  Other Reaction(s): Not available   Diphenhydramine -Phenylephrine  Anaphylaxis and Swelling    From IV   Ace Inhibitors Swelling    lips  Other Reaction(s): Not available  Other Reaction(s): edema, Other (See Comments)  Lips swell  lips   Sulfa Antibiotics Hives, Itching and Rash   Triamterene Swelling   Amoxicillin Diarrhea   Hydrochlorothiazide-Triamterene Swelling   Lisinopril Swelling   Norvasc [Amlodipine Besylate] Swelling   Amlodipine Swelling    Other Reaction(s): other  Other Reaction(s): Other (See Comments), Other (See Comments)  Does not remember   Dilantin [Phenytoin Sodium Extended] Hives and Rash   Phenytoin Hives, Itching and Rash    Past Medical History:  Diagnosis Date   Anemia    Asthma    Hypertension    Hypothyroidism    Multiple sclerosis (HCC)    Neuromuscular disorder (HCC)    Pre-diabetes    Pulmonary embolism (HCC)    Vertigo    Vision abnormalities      Past Surgical History:  Procedure Laterality Date   COLONOSCOPY  2023   EYE SURGERY  2017   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   Procedure: LEFT TOTAL KNEE ARTHROPLASTY;  Surgeon: Dayne Even, MD;  Location: [facility];  Service: Orthopedics;  Laterality: Left;    Family History  Problem Relation Age of Onset   Hyperlipidemia Mother    Hypertension Mother    Stroke Father    Diabetes Father     Social History   Tobacco Use   Smoking status: Never   Smokeless tobacco: Never  Vaping Use   Vaping status: Never Used  Substance Use Topics   Alcohol use: Never    Alcohol/week: 0.0 standard drinks of alcohol   Drug use: Never    ROS   Objective:   Vitals: BP (!) 158/88 (BP Location: Left Arm)   Pulse 79   Temp 97.8 F (36.6 C) (Oral)   Resp 20   LMP 01/01/2017  (Exact Date)   SpO2 99%   Physical Exam Constitutional:      General: She is not in acute distress.    Appearance: Normal appearance. She is well-developed. She is not ill-appearing, toxic-appearing or diaphoretic.  HENT:     Head: Normocephalic and atraumatic.     Nose: Nose normal.     Mouth/Throat:     Mouth: Mucous membranes are moist.  Eyes:     General: No scleral icterus.       Right eye: No discharge.        Left eye: No discharge.     Extraocular Movements: Extraocular movements intact.     Conjunctiva/sclera: Conjunctivae normal.  Cardiovascular:     Rate and Rhythm: Normal rate.  Pulmonary:     Effort: Pulmonary effort is normal.  Abdominal:     General: Bowel sounds are increased. There is no distension.     Palpations: Abdomen is soft. There is no mass.     Tenderness: There is no abdominal tenderness. There is no right CVA tenderness, left CVA tenderness, guarding or rebound.  Skin:    General: Skin is warm and dry.  Neurological:     General: No focal deficit present.     Mental Status: She is alert and oriented to person, place, and time.  Psychiatric:        Mood and Affect: Mood normal.        Behavior: Behavior normal.        Thought Content: Thought content normal.        Judgment: Judgment normal.     Assessment and Plan :   PDMP not reviewed this encounter.  1. Acute colitis    Recommend empiric treatment with azithromycin  for an acute colitis infection.  Will defer workup at this stage.  Hydrate well and use general supportive care.  Counseled patient on potential for adverse effects with medications prescribed/recommended today, ER and return-to-clinic precautions discussed, patient verbalized understanding.    Adolph Hoop, PA-C 02/29/24 1425

## 2024-02-29 NOTE — Discharge Instructions (Addendum)
 Take azithromycin  for your colitis infection. Make sure you push fluids drinking mostly water but mix it with Gatorade.  Try to eat light meals including soups, broths and soft foods, fruits.  You may use Zofran  for your nausea and vomiting once every 8 hours.  Imodium can help with diarrhea but use this carefully limiting it to 1-2 times per day only if you are having a lot of diarrhea.  Please return to the clinic if symptoms worsen or you start having severe abdominal pain not helped by taking Tylenol  or start having bloody stools or blood in the vomit.

## 2024-02-29 NOTE — ED Triage Notes (Signed)
 Pt c/o diarrhea x 3 weeks-started after eating salad bar at [facility] Tuesday-also c/o scattered "spots to my skin"-NAD-steady gait

## 2024-03-10 ENCOUNTER — Other Ambulatory Visit (HOSPITAL_COMMUNITY)

## 2024-04-06 ENCOUNTER — Telehealth: Payer: Self-pay

## 2024-04-06 NOTE — Telephone Encounter (Signed)
 Copied from CRM [id number]. Topic: Clinical - Medical Advice >> Apr 06, 2024 10:42 AM Russell PARAS wrote: Reason for CRM:   Pt is feeling better and would like to know if she should keep appt on 07/01 or if she can cancel due to improvement in symptoms. Of note, he also prescribes her Eliquis    CB#  [phone]   Spoke with Pt that her appoint is a F/U and best to keep it. Pt  Verbalized understanding     NFN

## 2024-04-07 ENCOUNTER — Ambulatory Visit (INDEPENDENT_AMBULATORY_CARE_PROVIDER_SITE_OTHER): Admitting: Pulmonary Disease

## 2024-04-07 ENCOUNTER — Encounter: Payer: Self-pay | Admitting: Pulmonary Disease

## 2024-04-07 VITALS — BP 127/82 | HR 92 | Ht 62.0 in | Wt 159.0 lb

## 2024-04-07 DIAGNOSIS — J45909 Unspecified asthma, uncomplicated: Secondary | ICD-10-CM | POA: Diagnosis not present

## 2024-04-07 DIAGNOSIS — I2699 Other pulmonary embolism without acute cor pulmonale: Secondary | ICD-10-CM

## 2024-04-07 MED ORDER — APIXABAN 5 MG PO TABS: 5.0000 mg | ORAL_TABLET | Freq: Two times a day (BID) | ORAL | 12 refills | Status: AC

## 2024-04-07 NOTE — Patient Instructions (Signed)
 Refill Eliquis  for 1 year, no changes  Return to clinic in 1 year or sooner as needed with Dr. Annella

## 2024-04-07 NOTE — Progress Notes (Signed)
 @Patient  ID: Pamela Potter, female    DOB: 05/26/1966, 58 y.o.   MRN: [id number]  Chief Complaint  Patient presents with   Follow-up    Referring provider: Arloa Elsie SAUNDERS, MD  HPI:   58 y.o. whom we are seeing in follow-up for evaluation of dyspnea on exertion and cough since COVID-19 infection 09/2020.  Incidentally discovered pulmonary embolus 10/2022.  Overall doing well.  Breathing cough etc. asthma symptoms much improved overall.  Even off Trelegy.  Good adherence to Eliquis .  No issues with obtaining due to cost etc.  No issues with bleeding.  HPI at initial visit Patient contracted COVID 09/2020.  She received antibody infusion.  She had COVID-vaccine x3 prior to infection.  Symptoms were nasal congestion, sore throat.  Mild dyspnea.  Though symptoms gradually improved except congestion in the nasal passages has persisted.  Dyspnea exertion has worsened.  Dyspnea worse up inclines or stairs.  Can be present on flat surfaces as well.  No timing during day when things are better or worse.  No seasonal or environmental changes make things better or worse.  She did receive prednisone  a few days ago and this seemed to help somewhat.  Similarly, no rhyme or reason to her cough.  No relieving or exacerbating factors that she can readily identify.  No timing during day were things are better or worse.  No seasonal or environmental changes.  With the symptoms she went to the ED 11/2020 and had a CTA chest PE protocol performed which on my interpretation reveals no evidence of PE, scattered faint groundglass opacities bilaterally throughout all lobes consistent with atypical pneumonia versus resolving COVID-19 pneumonia.  She was given doxycycline.  Per neurology notes 12/2020 symptoms improved quite a bit with this intervention.  However patient does not recall this and states nothing is really helped.  PMH: Hypertension, MS Surgical history: Reviewed reviewed with patient, she denies  surgeries Family history: Reviewed with patient, she denies respiratory illnesses in first-degree relatives Social history: Never smoker, lives with husband, lives in [place]  Questionaires / Pulmonary Flowsheets:   ACT:  Asthma Control Test ACT Total Score  04/07/2024  1:24 PM 25  07/17/2023  2:42 PM 23  05/08/2022 11:00 AM 17   MMRC:     No data to display          Epworth:      No data to display          Tests:   FENO:  No results found for: NITRICOXIDE  PFT:    Latest Ref Rng & Units 02/20/2021   11:47 AM  PFT Results  FVC-Pre L 2.35   FVC-Predicted Pre % 90   FVC-Post L 2.34   FVC-Predicted Post % 90   Pre FEV1/FVC % % 87   Post FEV1/FCV % % 88   FEV1-Pre L 2.03   FEV1-Predicted Pre % 99   FEV1-Post L 2.06   DLCO uncorrected ml/min/mmHg 9.65   DLCO UNC% % 49   DLCO corrected ml/min/mmHg 9.65   DLCO COR %Predicted % 49   DLVA Predicted % 68   TLC L 3.12   TLC % Predicted % 64   RV % Predicted % 48   Personally reviewed and interpreted as normal spirometry with moderate restriction based on TLC with severely reduced DLCO.  WALK:      No data to display          Imaging: Personally reviewed and as per EMR discussion  this note CT high-res 03/2021.  This demonstrated on my interpretation scattered groundglass opacities with mild bronchiectasis scattered throughout concerning for ongoing inflammation from COVID-19 with the presence of bronchiectasis developing fibrosis although there is no frank fibrosis demonstrated on the scan.  Lab Results: Personally reviewed, no significant elevation of eosinophils and no anemia CBC    Component Value Date/Time   WBC 4.3 01/13/2024 1318   WBC 3.7 (L) 11/10/2023 1625   RBC 4.82 01/13/2024 1318   RBC 4.88 11/10/2023 1625   HGB 12.7 01/13/2024 1318   HCT 39.7 01/13/2024 1318   PLT 319 01/13/2024 1318   MCV 82 01/13/2024 1318   MCH 26.3 (L) 01/13/2024 1318   MCH 26.8 11/10/2023 1625   MCHC 32.0  01/13/2024 1318   MCHC 32.4 11/10/2023 1625   RDW 14.7 01/13/2024 1318   LYMPHSABS 1.6 01/13/2024 1318   MONOABS 0.3 11/10/2023 1625   EOSABS 0.1 01/13/2024 1318   BASOSABS 0.0 01/13/2024 1318    BMET    Component Value Date/Time   NA 138 02/06/2024 1309   K 4.5 02/06/2024 1309   CL 104 02/06/2024 1309   CO2 23 02/06/2024 1309   GLUCOSE 78 02/06/2024 1309   GLUCOSE 100 (H) 11/10/2023 1625   BUN 14 02/06/2024 1309   CREATININE 0.94 02/06/2024 1309   CALCIUM 9.2 02/06/2024 1309   GFRNONAA >60 11/10/2023 1625   GFRAA 70 07/11/2020 1343    BNP No results found for: BNP  ProBNP No results found for: PROBNP  Specialty Problems   None  Allergies  Allergen Reactions   Diphenhydramine  Anaphylaxis and Other (See Comments)    Difficulty swallowing  Other Reaction(s): Not available   Diphenhydramine -Phenylephrine  Anaphylaxis and Swelling    From IV   Ace Inhibitors Swelling    lips  Other Reaction(s): Not available  Other Reaction(s): edema, Other (See Comments)  Lips swell  lips   Sulfa Antibiotics Hives, Itching and Rash   Triamterene Swelling   Amoxicillin Diarrhea   Hydrochlorothiazide-Triamterene Swelling   Lisinopril Swelling   Norvasc [Amlodipine Besylate] Swelling   Amlodipine Swelling    Other Reaction(s): other  Other Reaction(s): Other (See Comments), Other (See Comments)  Does not remember   Dilantin [Phenytoin Sodium Extended] Hives and Rash   Phenytoin Hives, Itching and Rash    Immunization History  Administered Date(s) Administered   Influenza, Seasonal, Injecte, Preservative Fre 07/17/2023   Influenza,inj,Quad PF,6+ Mos 08/13/2018   Influenza-Unspecified 08/08/2014, 07/02/2019, 07/14/2021   Zoster, Live 09/07/2013    Past Medical History:  Diagnosis Date   Anemia    Asthma    Hypertension    Hypothyroidism    Multiple sclerosis (HCC)    Neuromuscular disorder (HCC)    Pre-diabetes    Pulmonary embolism (HCC)    Vertigo     Vision abnormalities     Tobacco History: Social History   Tobacco Use  Smoking Status Never  Smokeless Tobacco Never   Counseling given: Not Answered   Continue to not smoke  Outpatient Encounter Medications as of 04/07/2024  Medication Sig   apixaban  (ELIQUIS ) 5 MG TABS tablet Take 1 tablet (5 mg total) by mouth 2 (two) times daily.   carvedilol (COREG) 6.25 MG tablet Take 6.25 mg by mouth 2 (two) times daily with a meal.   cetirizine  (ZYRTEC ) 10 MG tablet Take 1 tablet (10 mg total) by mouth daily.   Ergocalciferol  (VITAMIN D2 PO) Take 1,250 mcg by mouth as directed. ERGOCALCIFEROL  (VITAMIN D2) 1,250 MCG (  50,000 UNIT) CAPSULE   furosemide (LASIX) 20 MG tablet Take 20 mg by mouth 2 (two) times daily.   levothyroxine (SYNTHROID, LEVOTHROID) 50 MCG tablet Take 50 mcg by mouth daily before breakfast.   OCRELIZUMAB  (OCREVUS ) 600 MG INFUSION Inject 600 mg into the vein every 6 (six) months.   prednisoLONE acetate (PRED MILD) 0.12 % ophthalmic suspension Place 1 drop into the right eye as directed.   Saline (SIMPLY SALINE) 0.9 % AERS Place 1 spray into the nose as needed (congestion).   [DISCONTINUED] azithromycin  (ZITHROMAX ) 250 MG tablet Day 1: take 2 tablets. Day 2-5: Take 1 tablet daily.   [DISCONTINUED] Cholecalciferol 50 MCG (2000 UT) TABS Take 2,000 Units by mouth as directed.   [DISCONTINUED] ELIQUIS  5 MG TABS tablet TAKE 1 TABLET BY MOUTH TWICE A DAY   [DISCONTINUED] loperamide  (IMODIUM ) 2 MG capsule Take 1 capsule (2 mg total) by mouth 2 (two) times daily as needed for diarrhea or loose stools.   [DISCONTINUED] ocrelizumab  (OCREVUS ) 300 MG/10ML injection Inject into the vein.   No facility-administered encounter medications on file as of 04/07/2024.     Review of Systems  Review of Systems  N/a Physical Exam  BP 127/82 (BP Location: Left Arm, Patient Position: Sitting, Cuff Size: Normal)   Pulse 92   Ht 5' 2 (1.575 m)   Wt 159 lb (72.1 kg)   LMP 01/01/2017 (Exact  Date)   SpO2 95%   BMI 29.08 kg/m   Wt Readings from Last 5 Encounters:  04/07/24 159 lb (72.1 kg)  01/29/24 162 lb (73.5 kg)  01/13/24 162 lb (73.5 kg)  11/10/23 160 lb (72.6 kg)  10/12/23 160 lb (72.6 kg)    BMI Readings from Last 5 Encounters:  04/07/24 29.08 kg/m  01/29/24 28.70 kg/m  01/13/24 28.70 kg/m  11/10/23 28.34 kg/m  10/12/23 28.34 kg/m     Physical Exam General: Well-appearing, no acute distress Eyes: EOMI, no icterus Neck: Supple no JVP Cardiovascular: Regular rate and rhythm, no murmur Pulmonary: Clear bilaterally, normal work of breathing Abdomen: Nondistended, bowel sounds present MSK: No synovitis, no joint effusion Neuro: Normal gait, no weakness Psych: Normal mood, full affect   Assessment & Plan:   Presumed asthma: Triggered by COVID-19 infection.  ICS/LABA without much improvement.  Doing well off maintenance inhaler..  Continue to use albuterol  as needed with spacer.    Incidentally discovered pulmonary embolus: Likely provoked in the setting of recent COVID infection as well as knee replacement.  Strong family history as well.  Continue Eliquis , 5 mg twice daily, refilled today.  Recommend  lifelong anticoagulation given her family history.     Return in about 1 year (around 04/07/2025) for f/u Dr. Annella.   Donnice JONELLE Annella, MD 04/07/2024

## 2024-04-08 ENCOUNTER — Ambulatory Visit (HOSPITAL_COMMUNITY)
Admission: RE | Admit: 2024-04-08 | Discharge: 2024-04-08 | Disposition: A | Discharge status: Home / Self Care | Source: Ambulatory Visit | Attending: Cardiology | Admitting: Cardiology

## 2024-04-08 DIAGNOSIS — I10 Essential (primary) hypertension: Secondary | ICD-10-CM

## 2024-04-08 DIAGNOSIS — R072 Precordial pain: Secondary | ICD-10-CM | POA: Diagnosis present

## 2024-04-08 LAB — ECHOCARDIOGRAM COMPLETE
Area-P 1/2: 3.31 cm2
S' Lateral: 2.3 cm

## 2024-05-13 ENCOUNTER — Ambulatory Visit (HOSPITAL_COMMUNITY)
Admission: RE | Admit: 2024-05-13 | Discharge: 2024-05-13 | Disposition: A | Discharge status: Home / Self Care | Source: Ambulatory Visit | Attending: Vascular Surgery | Admitting: Vascular Surgery

## 2024-05-13 ENCOUNTER — Other Ambulatory Visit (HOSPITAL_COMMUNITY): Payer: Self-pay | Admitting: Orthopaedic Surgery

## 2024-05-13 DIAGNOSIS — M79661 Pain in right lower leg: Secondary | ICD-10-CM | POA: Insufficient documentation

## 2024-07-22 LAB — LAB REPORT - SCANNED: EGFR: 62

## 2024-08-18 ENCOUNTER — Ambulatory Visit: Admitting: Neurology

## 2024-08-18 ENCOUNTER — Encounter: Payer: Self-pay | Admitting: Neurology

## 2024-08-18 VITALS — BP 120/62 | HR 62 | Ht 63.0 in | Wt 161.5 lb

## 2024-08-18 DIAGNOSIS — E559 Vitamin D deficiency, unspecified: Secondary | ICD-10-CM

## 2024-08-18 DIAGNOSIS — N3941 Urge incontinence: Secondary | ICD-10-CM

## 2024-08-18 DIAGNOSIS — R5383 Other fatigue: Secondary | ICD-10-CM

## 2024-08-18 DIAGNOSIS — Z79899 Other long term (current) drug therapy: Secondary | ICD-10-CM

## 2024-08-18 DIAGNOSIS — R269 Unspecified abnormalities of gait and mobility: Secondary | ICD-10-CM | POA: Diagnosis not present

## 2024-08-18 DIAGNOSIS — G35A Relapsing-remitting multiple sclerosis: Secondary | ICD-10-CM

## 2024-08-18 DIAGNOSIS — R202 Paresthesia of skin: Secondary | ICD-10-CM | POA: Diagnosis not present

## 2024-08-18 NOTE — Progress Notes (Signed)
 [facility]  [facility]  PATIENT: Pamela Potter DOB: December 04, 1965  REFERRING DOCTOR OR PCP:  Dr. Fayette    Dr. Naomie Seminole is PCP Gwen Triad0 SOURCE: patient, records from Dr. Fayette. Laboratory results, MRI results, multiple MRI images on PACS  _________________________________   HISTORICAL  CHIEF COMPLAINT:  Chief Complaint  Patient presents with   Follow-up    Pt in room 10. Alone. Here for MS follow up    HISTORY OF PRESENT ILLNESS:  Pamela Potter is a 58 y.o. woman with relapsing remitting multiple sclerosis.  Update  08/18/2024 She is on Ocrevus . Her next  infusion is 08/18/2024  She feels her MS is doing ok and has no recent exacerbation or new MS symptoms.   She tolerates Ocrevus  well.   IgG was low normal once but normal last 2 times in 2024/2025.   Recent Neutrophil count is bordeline low at 1.8.   Vit D was mildly reduced at 29  Gait is mildly off balanced.  She does worse in heat. She needs to hold the bannister on stairs.    She feels her right leg does worse than her left leg.     She gets some tingling in her feet but this is stable.    She has urinary urgency and occasional incontinence and wears pads. No recent UTI.   No hesitancy.     Vision has done well.     She has daily fatigue taht has seemed worse since Covid in late 2021.   She is a little better but then had Covid last Oct/Nov.      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  She usally has a delayes phase (2am or later).   She denies depression.    She notes verbal fluency, esp names, and short term memory issues are worse.    She has right hip pain and left knee pain. She had left knee replacement 1o/2023.  She is on an anticoagulant for PE.   She has noted some swelling in her ankles lately  She has seen orthopedics for a Bakers cyst - pain and size fluctuates.   She has had some chest pain and took a steroid pack for costochondritis.       MS History: She was  diagnosed with MS in 1991 after presenting with gait disturbance, weakness and vertigo. An MRI of the brain showed plaques and a lumbar puncture were reportedly consistent with MS. She saw Dr. Wanda Hutchinson a couple years later and was started her on Avonex.   She stayed on Avonex for about 10 years but then had new lesions on her MRI. She was referred to Dr. Juliane. He placed her on Tysabri and she stayed on for a few years. 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 Unfortunately, on Gilenya, she had some MRI progression and Tysabri was restarted. . Of note, her JCV antibody titers were initially less than 0.9. However, over the last couple years her titers have increased and her last JCV antibody was 1.03. She was switched to ocrelizumab  and had her first dose the end of June 2017 and her second dose in mid July. She has not noted any exacerbations while on ocrelizumab .   DATA: MRI images from 02/10/2016 show classic T2/FLAIR hyperintense foci in the periventricular, juxtacortical and deep white matter of both hemispheres and also in the pons and right middle cerebellar peduncle  in a pattern and configuration consistent with chronic demyelinating plaque associated with MS.   There were no acute findings and no change when compared to the previous MRI, from 05/17/2014  The MRI of the cervical and thoracic spine 2017 showed foci in the upper cervical spine and in the mid to lower thoracic spine. None of these were acute.      I reviewed her labs. Her JCV antibody on 12/25/2015 was 1.03 (middle positive). She had normal CD4 and CD8 cells.   Hepatitis B labs and Quantiferon TB test were negative or nonreactive.  MRI 02/12/2020 cervical spine:  Patchy T2 hyperintense foci within the upper cervical spine as described above.  None of the foci appear to be acute and there are no definite new lesions compared to the previous MRI from 2015.   Small left  paramedian disc protrusion at C6-C7, not present on the 2015 MRI, that does not lead to nerve root compression or spinal stenosis  MRI 02/12/2020 Brain:  Multiple T2/FLAIR hyperintense foci in the brainstem, middle cerebellar peduncles and hemispheres in a pattern and configuration consistent with chronic demyelinating plaque associated with multiple sclerosis.  None of the foci appear to be acute.  Compared to the MRI from 01/08/2018, there are no new lesions.  MRI 07/12/2022 Brain was stable compared to 02/12/2020  REVIEW OF SYSTEMS: Constitutional: No fevers, chills, sweats, or change in appetite.  Reports fatigue eyes: as abvove Ear, nose and throat: No hearing loss, ear pain, nasal congestion, sore throat Cardiovascular: No chest pain, palpitations Respiratory:  No shortness of breath at rest or with exertion.   No wheezes GastrointestinaI: No nausea, vomiting, diarrhea, abdominal pain, fecal incontinence Genitourinary:  Notes frequency and nocturia with occasional urge incontinence Musculoskeletal:  No neck pain, back pain Integumentary: No rash, pruritus, skin lesions Neurological: as above Psychiatric: No depression at this time.  She denies anxiety. Endocrine: No palpitations, diaphoresis, change in appetite, change in weigh or increased thirst Hematologic/Lymphatic:  No anemia, purpura, petechiae. Allergic/Immunologic: No itchy/runny eyes, nasal congestion, recent allergic reactions, rashes  ALLERGIES: Allergies  Allergen Reactions   Diphenhydramine  Anaphylaxis and Other (See Comments)    Difficulty swallowing  Other Reaction(s): Not available   Diphenhydramine -Phenylephrine  Anaphylaxis and Swelling    From IV   Ace Inhibitors Swelling    lips  Other Reaction(s): Not available  Other Reaction(s): edema, Other (See Comments)  Lips swell  lips   Sulfa Antibiotics Hives, Itching and Rash   Triamterene Swelling   Amoxicillin Diarrhea   Hydrochlorothiazide-Triamterene Swelling    Lisinopril Swelling   Norvasc [Amlodipine Besylate] Swelling   Amlodipine Swelling    Other Reaction(s): other  Other Reaction(s): Other (See Comments), Other (See Comments)  Does not remember   Dilantin [Phenytoin Sodium Extended] Hives and Rash   Phenytoin Hives, Itching and Rash    HOME MEDICATIONS:  Current Outpatient Medications:    apixaban  (ELIQUIS ) 5 MG TABS tablet, Take 1 tablet (5 mg total) by mouth 2 (two) times daily., Disp: 60 tablet, Rfl: 12   carvedilol (COREG) 6.25 MG tablet, Take 6.25 mg by mouth 2 (two) times daily with a meal., Disp: , Rfl:    cetirizine  (ZYRTEC ) 10 MG tablet, Take 1 tablet (10 mg total) by mouth daily., Disp: 30 tablet, Rfl: 0   chlorthalidone (HYGROTON) 25 MG tablet, Take 25 mg by mouth daily., Disp: , Rfl:    Ergocalciferol  (VITAMIN D2 PO), Take 1,250 mcg by mouth as directed. ERGOCALCIFEROL  (VITAMIN D2) 1,250 MCG (50,000  UNIT) CAPSULE, Disp: , Rfl:    furosemide (LASIX) 20 MG tablet, Take 20 mg by mouth 2 (two) times daily., Disp: , Rfl:    levothyroxine (SYNTHROID, LEVOTHROID) 50 MCG tablet, Take 50 mcg by mouth daily before breakfast., Disp: , Rfl:    OCRELIZUMAB  (OCREVUS ) 600 MG INFUSION, Inject 600 mg into the vein every 6 (six) months., Disp: , Rfl:    prednisoLONE acetate (PRED MILD) 0.12 % ophthalmic suspension, Place 1 drop into the right eye as directed., Disp: , Rfl:    rosuvastatin (CRESTOR) 10 MG tablet, Take 10 mg by mouth daily., Disp: , Rfl:    Saline (SIMPLY SALINE) 0.9 % AERS, Place 1 spray into the nose as needed (congestion)., Disp: , Rfl:   PAST MEDICAL HISTORY: Past Medical History:  Diagnosis Date   Anemia    Asthma    Hypertension    Hypothyroidism    Multiple sclerosis    Neuromuscular disorder (HCC)    Pre-diabetes    Pulmonary embolism (HCC)    Vertigo    Vision abnormalities     PAST SURGICAL HISTORY: Past Surgical History:  Procedure Laterality Date   COLONOSCOPY  2023   EYE SURGERY  2017   TOTAL  KNEE ARTHROPLASTY Left 07/17/2022   Procedure: LEFT TOTAL KNEE ARTHROPLASTY;  Surgeon: Sheril Coy, MD;  Location: [facility];  Service: Orthopedics;  Laterality: Left;    FAMILY HISTORY: Family History  Problem Relation Age of Onset   Hyperlipidemia Mother    Hypertension Mother    Stroke Father    Diabetes Father     SOCIAL HISTORY:  Social History   Socioeconomic History   Marital status: Married    Spouse name: Not on file   Number of children: Not on file   Years of education: Not on file   Highest education level: Not on file  Occupational History   Not on file  Tobacco Use   Smoking status: Never   Smokeless tobacco: Never  Vaping Use   Vaping status: Never Used  Substance and Sexual Activity   Alcohol use: Never    Alcohol/week: 0.0 standard drinks of alcohol   Drug use: Never   Sexual activity: Not Currently  Other Topics Concern   Not on file  Social History Narrative   Not on file   Social Drivers of Health   Financial Resource Strain: Not on file  Food Insecurity: Not on file  Transportation Needs: Not on file  Physical Activity: Not on file  Stress: Not on file  Social Connections: Not on file  Intimate Partner Violence: Not on file     PHYSICAL EXAM  Vitals:   08/18/24 1619  BP: 120/62  Pulse: 62  Weight: 161 lb 8 oz (73.3 kg)  Height: 5' 3 (1.6 m)    Body mass index is 28.61 kg/m.   General: The patient is well-developed and well-nourished and in no acute distress   Neurologic Exam  Mental status: The patient is alert and oriented x 3 at the time of the examination. The patient has apparent normal recent and remote memory, with an apparently normal attention span and concentration ability.   Speech is normal.  Cranial nerves: Extraocular movements are full.  Facial strength and sensation are normal.  Trapezius strength is good.   No obvious hearing deficits are noted.  Motor:  Muscle bulk is normal but tone is mildly increased  in legs.. Strength is  5 / 5 in all 4 extremities except  4 distal right foot.   Sensory: Intact touch and vibration in arms and legs  Coordination:   Good bilateral FTN but reduced heel to shin bilaterally  Gait and station: Station is normal.   Her gait is wide.  Tandem gait is pooir   She has a mild right foot drop.  Romberg is negative.   Reflexes: Deep tendon reflexes are symmetric and normal in both arms. DTRs are increased in legs with spread at the knees.        Multiple sclerosis, relapsing-remitting - Plan: IgG, IgA, IgM, MR BRAIN WO CONTRAST, MR CERVICAL SPINE WO CONTRAST  High risk medication use - Plan: IgG, IgA, IgM  Gait disturbance - Plan: MR BRAIN WO CONTRAST, MR CERVICAL SPINE WO CONTRAST  Paresthesias  Urge incontinence of urine  Other fatigue  Vitamin D  deficiency   1.   Continue Ocrevus  for RRMS.     We will check IgG/IgM today.   Check MRI brain and cervical spin to determine if subclinical progression.  If occurring, consider a different DMT 2.    Stay active and exercise as tolerated.    3.    Due to physical and cognitive impairment and fatigue she is disabled and unable to return to work.   4.     rtc 6 months or sooner if ne or worsening issues  This visit is part of a comprehensive longitudinal care medical relationship regarding the patients primary diagnosis of multiple sclerosis and related concerns.  Jamiah Recore A. Vear, MD, PhD, FAAN Certified in Neurology, Clinical Neurophysiology, Sleep Medicine, Pain Medicine and Neuroimaging Director, [facility]  [facility] [address] [place], [place] [address] [phone]

## 2024-08-19 ENCOUNTER — Ambulatory Visit: Payer: Self-pay | Admitting: Neurology

## 2024-08-19 LAB — IGG, IGA, IGM
IgG (Immunoglobin G), Serum: 698 mg/dL (ref 586–1602)
IgM (Immunoglobulin M), Srm: 53 mg/dL (ref 26–217)
Immunoglobulin A, (IgA) QN, Serum: 59 mg/dL — ABNORMAL LOW (ref 87–352)

## 2024-08-20 ENCOUNTER — Telehealth: Payer: Self-pay | Admitting: Neurology

## 2024-08-20 NOTE — Telephone Encounter (Signed)
 no auth required sent to [facility] [phone]

## 2024-09-09 ENCOUNTER — Ambulatory Visit
Admission: RE | Admit: 2024-09-09 | Discharge: 2024-09-09 | Disposition: A | Source: Ambulatory Visit | Attending: Neurology | Admitting: Neurology

## 2024-09-09 DIAGNOSIS — R269 Unspecified abnormalities of gait and mobility: Secondary | ICD-10-CM

## 2024-09-09 DIAGNOSIS — G35A Relapsing-remitting multiple sclerosis: Secondary | ICD-10-CM

## 2024-09-22 ENCOUNTER — Ambulatory Visit: Attending: Cardiology | Admitting: Cardiology

## 2024-09-22 ENCOUNTER — Encounter: Payer: Self-pay | Admitting: Cardiology

## 2024-09-22 VITALS — BP 120/74 | HR 74 | Ht 63.0 in | Wt 163.4 lb

## 2024-09-22 DIAGNOSIS — E7800 Pure hypercholesterolemia, unspecified: Secondary | ICD-10-CM

## 2024-09-22 DIAGNOSIS — Z86711 Personal history of pulmonary embolism: Secondary | ICD-10-CM

## 2024-09-22 DIAGNOSIS — I10 Essential (primary) hypertension: Secondary | ICD-10-CM

## 2024-09-22 DIAGNOSIS — I2510 Atherosclerotic heart disease of native coronary artery without angina pectoris: Secondary | ICD-10-CM

## 2024-09-22 NOTE — Progress Notes (Signed)
 Cardiology Office Note:    NAME:  Pamela Potter    MRN: [id number] DOB:  Nov 16, 1965   PCP:  Arloa Elsie SAUNDERS, MD  Former Cardiology Providers: None Primary Cardiologist:  Madonna Large, DO, [facility] (established care 01/29/2024) Electrophysiologist:  None   Chief Complaint  Patient presents with   Follow-up    Nonobstructive CAD     History of Present Illness:    Pamela Potter is a 58 y.o. African-American female whose past medical history and cardiovascular risk factors includes: Multiple Sclerosis (disease-modifying therapy), Htn, hypothyroidism, hx of PE (11/2022 per patient).   Patient was referred to practice for evaluation of chest pain.  Please refer to the initial consult for additional details.  But in summary patient was recommended to undergo coronary CTA as well as echocardiogram.  Results of these were reviewed with her in detail and mentioned below for further reference.  Since last office visit Pamela Potter denies any anginal chest pain or heart failure symptoms.   No hospitalizations or urgent care visits for cardiovascular reasons.   She has been compliant with her medical therapy and endorses no concerns.  Physical endurance remains stable - walking 30 minutes 3 day/week  Home SBP are stable and being transmitted to PCP.   No structured exercise program or daily routine.  No first degree relatives with premature coronary disease or sudden cardiac death. Current Medications: Current Meds  Medication Sig   apixaban  (ELIQUIS ) 5 MG TABS tablet Take 1 tablet (5 mg total) by mouth 2 (two) times daily.   carvedilol (COREG) 6.25 MG tablet Take 6.25 mg by mouth 2 (two) times daily with a meal.   cetirizine  (ZYRTEC ) 10 MG tablet Take 1 tablet (10 mg total) by mouth daily.   chlorthalidone (HYGROTON) 25 MG tablet Take 25 mg by mouth daily.   Ergocalciferol  (VITAMIN D2 PO) Take 1,250 mcg by mouth as directed. ERGOCALCIFEROL  (VITAMIN D2) 1,250 MCG (50,000 UNIT) CAPSULE    furosemide (LASIX) 20 MG tablet Take 20 mg by mouth 2 (two) times daily.   levothyroxine (SYNTHROID, LEVOTHROID) 50 MCG tablet Take 50 mcg by mouth daily before breakfast.   OCRELIZUMAB  (OCREVUS ) 600 MG INFUSION Inject 600 mg into the vein every 6 (six) months.   prednisoLONE acetate (PRED MILD) 0.12 % ophthalmic suspension Place 1 drop into the right eye as directed.   rosuvastatin (CRESTOR) 10 MG tablet Take 10 mg by mouth daily.   Saline (SIMPLY SALINE) 0.9 % AERS Place 1 spray into the nose as needed (congestion).     Allergies:    Diphenhydramine , Diphenhydramine -phenylephrine , Ace inhibitors, Sulfa antibiotics, Triamterene, Amoxicillin, Hydrochlorothiazide-triamterene, Lisinopril, Norvasc [amlodipine besylate], Amlodipine, Dilantin [phenytoin sodium extended], and Phenytoin   Past Medical History: Past Medical History:  Diagnosis Date   Anemia    Asthma    Hypertension    Hypothyroidism    Multiple sclerosis    Neuromuscular disorder (HCC)    Pre-diabetes    Pulmonary embolism (HCC)    Vertigo    Vision abnormalities     Past Surgical History: Past Surgical History:  Procedure Laterality Date   COLONOSCOPY  2023   EYE SURGERY  2017   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   Procedure: LEFT TOTAL KNEE ARTHROPLASTY;  Surgeon: Sheril Coy, MD;  Location: [facility];  Service: Orthopedics;  Laterality: Left;    Social History: Social History   Tobacco Use   Smoking status: Never   Smokeless tobacco: Never  Vaping Use   Vaping status:  Never Used  Substance Use Topics   Alcohol use: Never    Alcohol/week: 0.0 standard drinks of alcohol   Drug use: Never    Family History: Family History  Problem Relation Age of Onset   Hyperlipidemia Mother    Hypertension Mother    Stroke Father    Diabetes Father     ROS:   Review of Systems  Cardiovascular:  Negative for chest pain, claudication, irregular heartbeat, leg swelling, near-syncope, orthopnea, palpitations,  paroxysmal nocturnal dyspnea and syncope.  Respiratory:  Negative for shortness of breath.   Hematologic/Lymphatic: Negative for bleeding problem.    EKGs/Labs/Other Studies Reviewed:   EKG: EKG Interpretation Date/Time:  Tuesday September 22 2024 08:16:12 EST Ventricular Rate:  69 PR Interval:  158 QRS Duration:  68 QT Interval:  390 QTC Calculation: 417 R Axis:   22  Text Interpretation: Normal sinus rhythm Cannot rule out Anterior infarct (cited on or before 10-Nov-2023) When compared with ECG of 29-Jan-2024 10:53, No significant change was found Confirmed by Michele Richardson [phone]) on 09/22/2024 8:20:04 AM  Echocardiogram: 04/08/2024 1. Left ventricular ejection fraction, by estimation, is 60 to 65%. Left ventricular ejection fraction by 3D volume is 63 %. The left ventricle has normal function. The left ventricle has no regional wall motion abnormalities. Left ventricular diastolic parameters are indeterminate. 2. Right ventricular systolic function is normal. The right ventricular size is normal. There is normal pulmonary artery systolic pressure. 3. The mitral valve is normal in structure. No evidence of mitral valve regurgitation. No evidence of mitral stenosis. 4. The aortic valve is normal in structure. Aortic valve regurgitation is not visualized. No aortic stenosis is present. 5. The inferior vena cava is normal in size with greater than 50% respiratory variability, suggesting right atrial pressure of 3 mmHg.   Coronary CTA  May 2025: 1. Mild nonobstructive coronary disease, CADRADS=2.  2. Coronary calcium score of 3.81. This was 69th percentile for age-, sex, and race-matched controls.  3. Total plaque volume 49 mm3 which is 51st percentile for age- and sex-matched controls (calcified plaque 1 mm3; non-calcified plaque 48 mm3). TPV is (mild).  4. Normal coronary origin with right dominance. Large first diagonal supplies much of anterior wall; distal LAD becomes small  caliber after D1.  5. No significant extracardiac findings within the visualized chest.  RECOMMENDATIONS: CAD-RADS 2: Mild non-obstructive CAD (25-49%). Consider non-atherosclerotic causes of chest pain. Consider preventive therapy and risk factor modification.   Labs:    Latest Ref Rng & Units 01/13/2024    1:18 PM 11/10/2023    4:25 PM 07/08/2023    1:23 PM  CBC  WBC 3.4 - 10.8 x10E3/uL 4.3  3.7  3.4   Hemoglobin 11.1 - 15.9 g/dL 87.2  86.8  86.8   Hematocrit 34.0 - 46.6 % 39.7  40.4  42.3   Platelets 150 - 450 x10E3/uL 319  285  288        Latest Ref Rng & Units 02/06/2024    1:09 PM 11/10/2023    4:25 PM 01/28/2023    1:40 PM  BMP  Glucose 70 - 99 mg/dL 78  899  92   BUN 6 - 24 mg/dL 14  14  11    Creatinine 0.57 - 1.00 mg/dL 9.05  9.07  9.10   BUN/Creat Ratio 9 - 23 15     Sodium 134 - 144 mmol/L 138  135  137   Potassium 3.5 - 5.2 mmol/L 4.5  4.0  3.7  Chloride 96 - 106 mmol/L 104  102  103   CO2 20 - 29 mmol/L 23  24  25    Calcium 8.7 - 10.2 mg/dL 9.2  9.4  9.3       Latest Ref Rng & Units 02/06/2024    1:09 PM 11/10/2023    4:25 PM 01/28/2023    1:40 PM  CMP  Glucose 70 - 99 mg/dL 78  899  92   BUN 6 - 24 mg/dL 14  14  11    Creatinine 0.57 - 1.00 mg/dL 9.05  9.07  9.10   Sodium 134 - 144 mmol/L 138  135  137   Potassium 3.5 - 5.2 mmol/L 4.5  4.0  3.7   Chloride 96 - 106 mmol/L 104  102  103   CO2 20 - 29 mmol/L 23  24  25    Calcium 8.7 - 10.2 mg/dL 9.2  9.4  9.3     No results found for: CHOL, HDL, LDLCALC, LDLDIRECT, TRIG, CHOLHDL No results for input(s): LIPOA in the last 8760 hours. No components found for: NTPROBNP No results for input(s): PROBNP in the last 8760 hours. No results for input(s): TSH in the last 8760 hours.  External Labs: Collected: November 25, 2023 provided by primary team. BUN 15, creatinine 1.02. Sodium 140, potassium 4.3, chloride 101, bicarb 29. AST 25, ALT 19 Alkaline phosphatase 131 (slightly above normal  limits) TSH 1.27  Collected: December 06, 2023. Hemoglobin 12.8, hematocrit 40%  Collected: January 02, 2023 provided by primary team. Total cholesterol 220, triglycerides 75, HDL 60, LDL 147, non-HDL 160  Collected: January 15, 2024 provided by primary team Total cholesterol 224, triglycerides 68, HDL 72, LDL calculated 140, non-HDL 152  External Labs: Collected: May 06, 2024 [facility] database. Total cholesterol 184, triglycerides 69, HDL 70, LDL calculated 101.  Collected: July 22, 2024 [facility] database. A1c 6%. Hemoglobin 13.9. TSH 1.08 yeah  Physical Exam:    Today's Vitals   09/22/24 0808  BP: 120/74  Pulse: 74  SpO2: 95%  Weight: 163 lb 6.4 oz (74.1 kg)  Height: 5' 3 (1.6 m)   Body mass index is 28.95 kg/m. Wt Readings from Last 3 Encounters:  09/22/24 163 lb 6.4 oz (74.1 kg)  08/18/24 161 lb 8 oz (73.3 kg)  04/07/24 159 lb (72.1 kg)    Physical Exam  Constitutional: No distress.  hemodynamically stable  Neck: No JVD present.  Cardiovascular: Normal rate, regular rhythm, S1 normal and S2 normal. Exam reveals no gallop, no S3 and no S4.  No murmur heard. Pulses:      Dorsalis pedis pulses are 2+ on the right side and 2+ on the left side.       Posterior tibial pulses are 2+ on the right side and 2+ on the left side.  Pulmonary/Chest: Effort normal and breath sounds normal. No stridor. She has no wheezes. She has no rales.  Musculoskeletal:        General: No edema.     Cervical back: Neck supple.  Skin: Skin is warm.     Impression & Recommendation(s):  Impression:   ICD-10-CM   1. Nonobstructive atherosclerosis of coronary artery  I25.10 EKG 12-Lead    2. Pure hypercholesterolemia  E78.00     3. Benign hypertension  I10     4. Hx pulmonary embolism  Z86.711        Recommendation(s):  Nonobstructive atherosclerosis of coronary artery Total CAC 3.81, 69 percentile Total plaque volume mild  with a score of 49 mm3 Mild nonobstructive coronary artery  disease as per coronary CTA from May 2025 No reoccurrence of chest pain EKG nonischemic Has not started Crestor and lipids have improved No additional cardiovascular testing warranted at this time Reviewed the results of the echo from July 2025 as well as a coronary CTA from May 2025 with the patient at today's visit Outside labs independently reviewed from July 2025  Pure hypercholesterolemia Currently on Crestor 10 mg p.o. daily.   She denies myalgia or other side effects. Most recent lipids dated July 2025, independently reviewed as noted above.  LDL is 101 mg/dL.  Cardiology is following peripherally.   Benign hypertension Office blood pressures are very well-controlled. It appears the patient is enrolled into remote monitoring with her PCPs office based on how she describes blood pressure monitoring  Hx pulmonary embolism Currently on anticoagulation managed by other providers in the care team  Patient has nonspecific complaints of tingling sensation in her feet bilaterally right greater than left.  She describes the sensation as running water.  No swelling on physical examination.  Patient states that she has noticed that her feet size has grown over the last 2 years and has required new shoes.  No obvious cardiovascular etiology to explain her symptoms.  I have advised her to follow-up with PCP and neurology.  Patient verbalized understanding   Orders Placed:  Orders Placed This Encounter  Procedures   EKG 12-Lead     Final Medication List:   No orders of the defined types were placed in this encounter.   There are no discontinued medications.   Current Outpatient Medications:    apixaban  (ELIQUIS ) 5 MG TABS tablet, Take 1 tablet (5 mg total) by mouth 2 (two) times daily., Disp: 60 tablet, Rfl: 12   carvedilol (COREG) 6.25 MG tablet, Take 6.25 mg by mouth 2 (two) times daily with a meal., Disp: , Rfl:    cetirizine  (ZYRTEC ) 10 MG tablet, Take 1 tablet (10 mg total) by  mouth daily., Disp: 30 tablet, Rfl: 0   chlorthalidone (HYGROTON) 25 MG tablet, Take 25 mg by mouth daily., Disp: , Rfl:    Ergocalciferol  (VITAMIN D2 PO), Take 1,250 mcg by mouth as directed. ERGOCALCIFEROL  (VITAMIN D2) 1,250 MCG (50,000 UNIT) CAPSULE, Disp: , Rfl:    furosemide (LASIX) 20 MG tablet, Take 20 mg by mouth 2 (two) times daily., Disp: , Rfl:    levothyroxine (SYNTHROID, LEVOTHROID) 50 MCG tablet, Take 50 mcg by mouth daily before breakfast., Disp: , Rfl:    OCRELIZUMAB  (OCREVUS ) 600 MG INFUSION, Inject 600 mg into the vein every 6 (six) months., Disp: , Rfl:    prednisoLONE acetate (PRED MILD) 0.12 % ophthalmic suspension, Place 1 drop into the right eye as directed., Disp: , Rfl:    rosuvastatin (CRESTOR) 10 MG tablet, Take 10 mg by mouth daily., Disp: , Rfl:    Saline (SIMPLY SALINE) 0.9 % AERS, Place 1 spray into the nose as needed (congestion)., Disp: , Rfl:   Consent:   NA  Disposition:   As needed  Her questions and concerns were addressed to her satisfaction. She voices understanding of the recommendations provided during this encounter.   Medical Decision Making:  Discussed management of at least two chronic comorbid conditions.  I have independently interpreted results of the EKG dated 09/22/2024, labs dated 04/2024 , and reviewed the results under studies reviewed as part of medical decision making.   Patient education provided at today's visit.  Prescription drug management including but not limited to: addition/titration/discontinuation of medical therapy, medication reconciliation, discussed potential side effects, follow up labs either reviewed or ordered for monitoring safety.   I have independently formulated and discussed the assessment and plan with the patient. This note will be shared with the patient's primary care provider to coordinate care.    Signed, Madonna Michele HAS, [facility]  A Division of [facility] [address], [place], [place] [address]

## 2024-09-22 NOTE — Patient Instructions (Signed)
 Medication Instructions:  Your physician recommends that you continue on your current medications as directed. Please refer to the Current Medication list given to you today.  *If you need a refill on your cardiac medications before your next appointment, please call your pharmacy*  Lab Work: None ordered If you have labs (blood work) drawn today and your tests are completely normal, you will receive your results only by: MyChart Message (if you have MyChart) OR A paper copy in the mail If you have any lab test that is abnormal or we need to change your treatment, we will call you to review the results.  Testing/Procedures: None ordered  Follow-Up: At [facility], you and your health needs are our priority.  As part of our continuing mission to provide you with exceptional heart care, our providers are all part of one team.  This team includes your primary Cardiologist (physician) and Advanced Practice Providers or APPs (Physician Assistants and Nurse Practitioners) who all work together to provide you with the care you need, when you need it.  Your next appointment:   As Needed  Provider:   Madonna Large, DO    We recommend signing up for the patient portal called MyChart.  Sign up information is provided on this After Visit Summary.  MyChart is used to connect with patients for Virtual Visits (Telemedicine).  Patients are able to view lab/test results, encounter notes, upcoming appointments, etc.  Non-urgent messages can be sent to your provider as well.   To learn more about what you can do with MyChart, go to [email or web address].

## 2024-11-03 ENCOUNTER — Ambulatory Visit (INDEPENDENT_AMBULATORY_CARE_PROVIDER_SITE_OTHER)

## 2024-11-03 ENCOUNTER — Ambulatory Visit
Admission: RE | Admit: 2024-11-03 | Discharge: 2024-11-03 | Disposition: A | Discharge status: Home / Self Care | Source: Ambulatory Visit | Attending: Physician Assistant | Admitting: Physician Assistant

## 2024-11-03 ENCOUNTER — Ambulatory Visit

## 2024-11-03 VITALS — BP 122/81 | HR 92 | Temp 98.2°F | Resp 16

## 2024-11-03 DIAGNOSIS — J209 Acute bronchitis, unspecified: Secondary | ICD-10-CM

## 2024-11-03 DIAGNOSIS — R051 Acute cough: Secondary | ICD-10-CM

## 2024-11-03 MED ORDER — AZITHROMYCIN 250 MG PO TABS: ORAL_TABLET | ORAL | 0 refills | Status: AC

## 2024-11-03 MED ORDER — ALBUTEROL SULFATE HFA 108 (90 BASE) MCG/ACT IN AERS: 2.0000 | INHALATION_SPRAY | Freq: Four times a day (QID) | RESPIRATORY_TRACT | 2 refills | Status: AC | PRN

## 2024-11-03 MED ORDER — AEROCHAMBER MV MISC: 0 refills | Status: AC

## 2024-11-03 NOTE — ED Provider Notes (Signed)
 " [name]    CSN: [id number] Arrival date & time: 11/03/24  1236      History   Chief Complaint Chief Complaint  Patient presents with   Nasal Congestion    nasal congestion of nose and rattling in chest - Entered by patient    HPI Pamela Potter is a 59 y.o. female.   Patient complains of a cough and congestion.  Patient reports that she began feeling sick 2 weeks ago.  Patient reports that she has continued to have a cough and congestion.Patient reports that she has had a rattling in her chest that gets worse when she lays down at night.  Patient does not currently have a fever.  Patient reports that she has used an inhaler in the past.  The history is provided by the patient. No language interpreter was used.    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   Primary osteoarthritis of left knee 07/17/2022   H/O total knee replacement, left 07/17/2022   High risk medication use 01/05/2020   Left knee pain 01/05/2020   Atypical facial pain 07/08/2019   Vitamin D  deficiency 07/07/2018   Multiple sclerosis 10/11/2016   Gait disturbance 10/11/2016   Urge incontinence of urine 10/11/2016   Numbness 10/11/2016   Corneal graft malfunction 11/21/2011   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      Home Medications    Prior to Admission medications  Medication Sig Start Date End Date Taking? Authorizing Provider  albuterol  (VENTOLIN  HFA) 108 (90 Base) MCG/ACT inhaler Inhale 2 puffs into the lungs every 6 (six) hours as needed for wheezing or shortness of breath. 11/03/24  Yes Ernestina Joe K, PA-C  azithromycin  (ZITHROMAX  Z-PAK) 250 MG tablet Take 2 tablets (500 mg) on  Day 1,  followed by 1 tablet (250 mg) once daily on Days 2 through 5. 11/03/24 11/08/24 Yes Nikeria Kalman K, PA-C  apixaban  (ELIQUIS ) 5 MG TABS tablet Take 1 tablet (5 mg total) by mouth 2 (two) times daily. 04/07/24   Hunsucker, Donnice SAUNDERS, MD  carvedilol (COREG) 6.25 MG tablet Take 6.25 mg by mouth 2 (two) times daily with a meal. 08/16/21   [provider]  cetirizine  (ZYRTEC ) 10 MG tablet Take 1 tablet (10 mg total) by mouth daily. 11/12/22   Hazen Darryle BRAVO, FNP  chlorthalidone (HYGROTON) 25 MG tablet Take 25 mg by mouth daily. 01/31/24  [provider]  Ergocalciferol  (VITAMIN D2 PO) Take 1,250 mcg by mouth as directed. ERGOCALCIFEROL  (VITAMIN D2) 1,250 MCG (50,000 UNIT) CAPSULE    [provider]  furosemide (LASIX) 20 MG tablet Take 20 mg by mouth 2 (two) times daily. 06/21/22   [provider]  levothyroxine (SYNTHROID, LEVOTHROID) 50 MCG tablet Take 50 mcg by mouth daily before breakfast.    [provider]  OCRELIZUMAB  (OCREVUS ) 600 MG INFUSION Inject 600 mg into the vein every 6 (six) months.    [provider]  prednisoLONE acetate (PRED MILD) 0.12 % ophthalmic suspension Place 1 drop into the right eye as directed. 04/09/22   [provider]  rosuvastatin  (CRESTOR) 10 MG tablet Take 10 mg by mouth daily. 01/31/24   [provider]  Saline (SIMPLY SALINE) 0.9 % AERS Place 1 spray into the nose as needed (congestion).    [provider]    Family History Family History  Problem Relation Age of Onset   Hyperlipidemia Mother    Hypertension Mother    Stroke Father    Diabetes Father     Social History Social History[1]   Allergies   Diphenhydramine , Diphenhydramine -phenylephrine , Ace inhibitors, Sulfa antibiotics, Triamterene, Amoxicillin, Hydrochlorothiazide-triamterene, Lisinopril, Norvasc [amlodipine besylate], Amlodipine, Dilantin [phenytoin sodium extended], and Phenytoin   Review of Systems Review of Systems  Respiratory:  Positive for cough and wheezing.   All other systems reviewed and are negative.    Physical Exam Triage Vital Signs ED Triage Vitals  Encounter Vitals Group     BP 11/03/24 1313 122/81     Girls Systolic BP Percentile --      Girls Diastolic BP Percentile --      Boys Systolic BP Percentile --      Boys Diastolic BP Percentile --      Pulse Rate 11/03/24 1313 92     Resp 11/03/24 1313 16     Temp 11/03/24 1313 98.2 F (36.8 C)     Temp Source 11/03/24 1313 Oral     SpO2 11/03/24 1313 96 %     Weight --      Height --      Head Circumference --      Peak Flow --      Pain Score 11/03/24 1312 0     Pain Loc --      Pain Education --      Exclude from Growth Chart --    No data found.  Updated Vital Signs BP 122/81 (BP Location: Left Arm)   Pulse 92   Temp 98.2 F (36.8 C) (Oral)   Resp 16   LMP 01/01/2017   SpO2 96%   Visual Acuity Right Eye Distance:   Left Eye Distance:   Bilateral Distance:    Right Eye Near:   Left Eye Near:    Bilateral Near:     Physical Exam Vitals reviewed.  Constitutional:      Appearance: Normal appearance.  HENT:     Nose: Nose normal.     Mouth/Throat:     Mouth: Mucous membranes are moist.  Cardiovascular:     Rate and  Rhythm: Normal rate and regular rhythm.     Pulses: Normal pulses.  Pulmonary:     Effort: Pulmonary effort is normal.     Breath sounds: Normal breath sounds.  Abdominal:     General: Abdomen is flat.  Musculoskeletal:        General: Normal range of motion.  Skin:    General: Skin is warm.  Neurological:     General: No focal deficit present.     Mental Status: She is alert.  Psychiatric:        Mood and Affect: Mood normal.      UC Treatments / Results  Labs (all labs ordered are listed, but only abnormal results are displayed) Labs Reviewed - No data to display  EKG   Radiology DG Chest 2 View Result Date: 11/03/2024 EXAM: 2 VIEW(S) XRAY OF THE CHEST 11/03/2024 01:43:41 PM COMPARISON: 11/10/2023 CLINICAL HISTORY: Cough. FINDINGS: LUNGS AND PLEURA: Low lung volumes. No focal pulmonary opacity. No pleural effusion. No pneumothorax. HEART AND MEDIASTINUM: No acute abnormality of the cardiac and mediastinal silhouettes. BONES AND SOFT TISSUES: Thoracic kyphosis. IMPRESSION: 1. No acute cardiopulmonary abnormality. 2. Low lung volumes and thoracic kyphosis. Electronically signed by: Ryan Salvage MD 11/03/2024 02:53 PM EST RP Workstation: [id number]    Procedures Procedures (including critical care time)  Medications Ordered in UC Medications - No data to display  Initial Impression / Assessment and Plan / UC Course  I have reviewed the triage vital signs and the nursing notes.  Pertinent labs & imaging results that were available during my care of the patient were reviewed by me and considered in my medical decision making (see chart for details).     MDM:  Pt given rx for zithromax .  Pt given rx for albuterol  inhaler and aerochamber.  Final Clinical Impressions(s) / UC Diagnoses   Final diagnoses:  Acute cough  Acute bronchitis, unspecified organism     Discharge Instructions      See your Physician for recheck next week.  Return if any problems.     ED Prescriptions     Medication Sig Dispense Auth. Provider   azithromycin  (ZITHROMAX  Z-PAK) 250 MG tablet Take 2 tablets (500 mg) on  Day 1,  followed by 1 tablet (250 mg) once daily on Days 2 through 5. 6 each Rashun Grattan K, PA-C   albuterol  (VENTOLIN  HFA) 108 (90 Base) MCG/ACT inhaler Inhale 2 puffs into the lungs every 6 (six) hours as needed for wheezing or shortness of breath. 8 g Jazariah Teall K, PA-C      PDMP not reviewed this encounter. An After Visit Summary was printed and given to the patient.        [1]  Social History Tobacco Use   Smoking status: Never   Smokeless tobacco: Never  Vaping Use   Vaping status: Never Used  Substance Use Topics   Alcohol use: Never    Alcohol/week: 0.0 standard drinks of alcohol   Drug use: Never     Flint Sonny POUR, PA-C 11/03/24 1521  "

## 2024-11-03 NOTE — Discharge Instructions (Signed)
See your Physician for recheck next week.  Return if any problems.   °

## 2024-11-03 NOTE — ED Triage Notes (Signed)
 Patient present for nasal congestion and rattling in her chest  X 1 week.  Patient has numbness and tingling in left side of the body x 2 weeks.  Patient has been taking mucinex  DM and chest congestion tablets.

## 2024-11-04 ENCOUNTER — Encounter: Payer: Self-pay | Admitting: Neurology

## 2024-11-05 ENCOUNTER — Other Ambulatory Visit: Payer: Self-pay | Admitting: Neurology

## 2024-11-05 MED ORDER — METHYLPREDNISOLONE 4 MG PO TABS: ORAL_TABLET | ORAL | 0 refills | Status: AC

## 2024-11-05 NOTE — Telephone Encounter (Signed)
 I called pt.  She has had symptoms of L hand numbness/tingling constant, L arm tingling sporadic , L face tingling sporadic for the last 3 wks.  She has no other symptom changes.  She does feel that is MS exacerbation.  She has been under stress (noted in 3 month jury trial), now being treated for  bronchitis (zithromax  since last Tuesday).  Last seen 08/2024, MRI brain/cervical 09/2024.  Pharmacy [facility] [address] confirmed.  I told her will relayed information to Dr. Vear and will let her know of his recommendations.

## 2025-03-24 ENCOUNTER — Ambulatory Visit: Admitting: Neurology
